# Patient Record
Sex: Male | Born: 1960 | Race: White | Hispanic: No | Smoking: Former smoker
Health system: Southern US, Community
[De-identification: ages and names within clinical notes are randomized; demographics above are authoritative.]

## PROBLEM LIST (undated history)

## (undated) DIAGNOSIS — Z86010 Personal history of colonic polyps: Secondary | ICD-10-CM

## (undated) DIAGNOSIS — R413 Other amnesia: Secondary | ICD-10-CM

## (undated) DIAGNOSIS — R42 Dizziness and giddiness: Secondary | ICD-10-CM

## (undated) DIAGNOSIS — D649 Anemia, unspecified: Secondary | ICD-10-CM

## (undated) DIAGNOSIS — Z720 Tobacco use: Secondary | ICD-10-CM

## (undated) DIAGNOSIS — E785 Hyperlipidemia, unspecified: Secondary | ICD-10-CM

## (undated) DIAGNOSIS — H8110 Benign paroxysmal vertigo, unspecified ear: Secondary | ICD-10-CM

## (undated) DIAGNOSIS — I219 Acute myocardial infarction, unspecified: Secondary | ICD-10-CM

## (undated) DIAGNOSIS — K648 Other hemorrhoids: Secondary | ICD-10-CM

## (undated) DIAGNOSIS — I2510 Atherosclerotic heart disease of native coronary artery without angina pectoris: Secondary | ICD-10-CM

## (undated) DIAGNOSIS — T7840XA Allergy, unspecified, initial encounter: Secondary | ICD-10-CM

## (undated) DIAGNOSIS — Z860101 Personal history of adenomatous and serrated colon polyps: Secondary | ICD-10-CM

## (undated) DIAGNOSIS — K222 Esophageal obstruction: Secondary | ICD-10-CM

## (undated) DIAGNOSIS — N912 Amenorrhea, unspecified: Secondary | ICD-10-CM

## (undated) DIAGNOSIS — K219 Gastro-esophageal reflux disease without esophagitis: Secondary | ICD-10-CM

## (undated) DIAGNOSIS — I10 Essential (primary) hypertension: Secondary | ICD-10-CM

## (undated) HISTORY — DX: Other amnesia: R41.3

## (undated) HISTORY — DX: Hyperlipidemia, unspecified: E78.5

## (undated) HISTORY — DX: Acute myocardial infarction, unspecified: I21.9

## (undated) HISTORY — DX: Dizziness and giddiness: R42

## (undated) HISTORY — DX: Other hemorrhoids: K64.8

## (undated) HISTORY — DX: Amenorrhea, unspecified: N91.2

## (undated) HISTORY — DX: Tobacco use: Z72.0

## (undated) HISTORY — DX: Hemochromatosis, unspecified: E83.119

## (undated) HISTORY — PX: COLONOSCOPY: SHX174

## (undated) HISTORY — DX: Personal history of adenomatous and serrated colon polyps: Z86.0101

## (undated) HISTORY — DX: Personal history of colonic polyps: Z86.010

## (undated) HISTORY — DX: Anemia, unspecified: D64.9

## (undated) HISTORY — PX: OTHER SURGICAL HISTORY: SHX169

## (undated) HISTORY — DX: Essential (primary) hypertension: I10

## (undated) HISTORY — PX: MEDIAN STERNOTOMY: SUR860

## (undated) HISTORY — DX: Esophageal obstruction: K22.2

## (undated) HISTORY — PX: CORONARY ARTERY BYPASS GRAFT: SHX141

## (undated) HISTORY — DX: Gastro-esophageal reflux disease without esophagitis: K21.9

## (undated) HISTORY — DX: Gilbert syndrome: E80.4

## (undated) HISTORY — DX: Benign paroxysmal vertigo, unspecified ear: H81.10

## (undated) HISTORY — DX: Allergy, unspecified, initial encounter: T78.40XA

## (undated) HISTORY — DX: Atherosclerotic heart disease of native coronary artery without angina pectoris: I25.10

---

## 2001-12-31 ENCOUNTER — Encounter: Payer: Self-pay | Admitting: Internal Medicine

## 2002-02-03 ENCOUNTER — Ambulatory Visit (HOSPITAL_COMMUNITY): Admission: RE | Admit: 2002-02-03 | Discharge: 2002-02-03 | Payer: Self-pay | Admitting: Gastroenterology

## 2002-02-03 ENCOUNTER — Encounter: Payer: Self-pay | Admitting: Internal Medicine

## 2002-02-03 ENCOUNTER — Encounter (INDEPENDENT_AMBULATORY_CARE_PROVIDER_SITE_OTHER): Payer: Self-pay | Admitting: *Deleted

## 2005-05-21 DIAGNOSIS — I219 Acute myocardial infarction, unspecified: Secondary | ICD-10-CM

## 2005-05-21 HISTORY — DX: Acute myocardial infarction, unspecified: I21.9

## 2005-06-03 ENCOUNTER — Ambulatory Visit: Payer: Self-pay | Admitting: Cardiology

## 2005-06-03 ENCOUNTER — Inpatient Hospital Stay (HOSPITAL_COMMUNITY): Admission: EM | Admit: 2005-06-03 | Discharge: 2005-06-12 | Payer: Self-pay | Admitting: *Deleted

## 2005-06-22 ENCOUNTER — Ambulatory Visit: Payer: Self-pay | Admitting: Cardiology

## 2005-08-09 ENCOUNTER — Encounter (HOSPITAL_COMMUNITY): Admission: RE | Admit: 2005-08-09 | Discharge: 2005-11-07 | Payer: Self-pay | Admitting: Cardiology

## 2005-09-20 ENCOUNTER — Emergency Department (HOSPITAL_COMMUNITY): Admission: EM | Admit: 2005-09-20 | Discharge: 2005-09-20 | Payer: Self-pay | Admitting: Emergency Medicine

## 2005-09-21 ENCOUNTER — Ambulatory Visit: Payer: Self-pay | Admitting: Cardiology

## 2005-09-26 ENCOUNTER — Ambulatory Visit: Payer: Self-pay | Admitting: Cardiology

## 2005-10-03 ENCOUNTER — Ambulatory Visit: Payer: Self-pay | Admitting: Cardiology

## 2005-10-10 ENCOUNTER — Ambulatory Visit: Payer: Self-pay | Admitting: Internal Medicine

## 2005-11-07 ENCOUNTER — Ambulatory Visit: Payer: Self-pay | Admitting: Cardiology

## 2005-11-20 ENCOUNTER — Ambulatory Visit: Payer: Self-pay | Admitting: Cardiology

## 2006-01-30 ENCOUNTER — Ambulatory Visit: Payer: Self-pay | Admitting: Internal Medicine

## 2006-02-18 ENCOUNTER — Encounter: Payer: Self-pay | Admitting: Internal Medicine

## 2006-03-27 ENCOUNTER — Ambulatory Visit: Payer: Self-pay | Admitting: Cardiology

## 2006-06-06 ENCOUNTER — Ambulatory Visit: Payer: Self-pay | Admitting: Internal Medicine

## 2006-07-18 ENCOUNTER — Ambulatory Visit: Payer: Self-pay | Admitting: Internal Medicine

## 2006-11-18 ENCOUNTER — Ambulatory Visit: Payer: Self-pay | Admitting: Internal Medicine

## 2007-03-28 ENCOUNTER — Ambulatory Visit: Payer: Self-pay | Admitting: Internal Medicine

## 2007-03-28 DIAGNOSIS — E785 Hyperlipidemia, unspecified: Secondary | ICD-10-CM | POA: Insufficient documentation

## 2007-03-28 DIAGNOSIS — I10 Essential (primary) hypertension: Secondary | ICD-10-CM | POA: Insufficient documentation

## 2007-03-28 LAB — CONVERTED CEMR LAB
Albumin: 4 g/dL (ref 3.5–5.2)
BUN: 10 mg/dL (ref 6–23)
Calcium: 9.4 mg/dL (ref 8.4–10.5)
Chloride: 102 meq/L (ref 96–112)
Cholesterol: 151 mg/dL (ref 0–200)
Creatinine, Ser: 0.9 mg/dL (ref 0.4–1.5)
GFR calc Af Amer: 116 mL/min
GFR calc non Af Amer: 96 mL/min
Potassium: 4 meq/L (ref 3.5–5.1)
Sodium: 139 meq/L (ref 135–145)

## 2007-04-02 ENCOUNTER — Ambulatory Visit: Payer: Self-pay | Admitting: Cardiology

## 2007-07-14 DIAGNOSIS — I2510 Atherosclerotic heart disease of native coronary artery without angina pectoris: Secondary | ICD-10-CM | POA: Insufficient documentation

## 2007-07-14 DIAGNOSIS — Z860100 Personal history of colon polyps, unspecified: Secondary | ICD-10-CM | POA: Insufficient documentation

## 2007-11-14 ENCOUNTER — Ambulatory Visit: Payer: Self-pay | Admitting: Internal Medicine

## 2007-11-14 DIAGNOSIS — R413 Other amnesia: Secondary | ICD-10-CM | POA: Insufficient documentation

## 2008-03-29 ENCOUNTER — Ambulatory Visit: Payer: Self-pay | Admitting: Cardiology

## 2008-04-20 ENCOUNTER — Ambulatory Visit: Payer: Self-pay | Admitting: Internal Medicine

## 2008-05-10 ENCOUNTER — Ambulatory Visit: Payer: Self-pay | Admitting: Cardiology

## 2008-05-10 LAB — CONVERTED CEMR LAB
ALT: 45 units/L (ref 0–53)
AST: 28 units/L (ref 0–37)
Alkaline Phosphatase: 48 units/L (ref 39–117)
BUN: 11 mg/dL (ref 6–23)
Calcium: 9.3 mg/dL (ref 8.4–10.5)
Cholesterol: 190 mg/dL (ref 0–200)
Creatinine, Ser: 0.9 mg/dL (ref 0.4–1.5)
Direct LDL: 118.6 mg/dL
GFR calc Af Amer: 115 mL/min
HDL: 28 mg/dL — ABNORMAL LOW (ref >39.0)
Sodium: 139 meq/L (ref 135–145)
Total CHOL/HDL Ratio: 6.8
Total Protein: 6.9 g/dL (ref 6.0–8.3)
VLDL: 50 mg/dL — ABNORMAL HIGH (ref 0–40)

## 2008-10-13 ENCOUNTER — Telehealth: Payer: Self-pay | Admitting: Cardiology

## 2008-10-14 ENCOUNTER — Ambulatory Visit: Payer: Self-pay | Admitting: Internal Medicine

## 2008-10-14 LAB — CONVERTED CEMR LAB
ALT: 71 units/L — ABNORMAL HIGH (ref 0–53)
Alkaline Phosphatase: 48 units/L (ref 39–117)
BUN: 16 mg/dL (ref 6–23)
Bilirubin, Direct: 0.8 mg/dL — ABNORMAL HIGH (ref 0.0–0.3)
CO2: 28 meq/L (ref 19–32)
Creatinine, Ser: 0.9 mg/dL (ref 0.4–1.5)
Potassium: 3.7 meq/L (ref 3.5–5.1)
Total Bilirubin: 3.4 mg/dL — ABNORMAL HIGH (ref 0.3–1.2)

## 2008-10-20 ENCOUNTER — Ambulatory Visit: Payer: Self-pay | Admitting: Internal Medicine

## 2008-10-25 ENCOUNTER — Telehealth (INDEPENDENT_AMBULATORY_CARE_PROVIDER_SITE_OTHER): Payer: Self-pay | Admitting: *Deleted

## 2009-01-14 ENCOUNTER — Encounter (INDEPENDENT_AMBULATORY_CARE_PROVIDER_SITE_OTHER): Payer: Self-pay | Admitting: *Deleted

## 2009-04-22 ENCOUNTER — Ambulatory Visit: Payer: Self-pay | Admitting: Internal Medicine

## 2009-04-22 LAB — CONVERTED CEMR LAB
ALT: 59 units/L — ABNORMAL HIGH (ref 0–53)
AST: 33 units/L (ref 0–37)
Bilirubin, Direct: 0.5 mg/dL — ABNORMAL HIGH (ref 0.0–0.3)
Cholesterol: 156 mg/dL (ref 0–200)
HDL: 31.9 mg/dL — ABNORMAL LOW (ref >39.00)
Total Bilirubin: 2.1 mg/dL — ABNORMAL HIGH (ref 0.3–1.2)
Total CHOL/HDL Ratio: 5
Total Protein: 7.4 g/dL (ref 6.0–8.3)
VLDL: 35.8 mg/dL (ref 0.0–40.0)

## 2009-05-16 ENCOUNTER — Ambulatory Visit: Payer: Self-pay | Admitting: Internal Medicine

## 2009-05-16 DIAGNOSIS — M7710 Lateral epicondylitis, unspecified elbow: Secondary | ICD-10-CM | POA: Insufficient documentation

## 2009-05-16 LAB — CONVERTED CEMR LAB
Ferritin: 597 ng/mL — ABNORMAL HIGH (ref 22–322)
Saturation Ratios: 49 % (ref 20–55)
Total CK: 81 units/L (ref 7–232)
UIBC: 171 ug/dL

## 2009-05-17 ENCOUNTER — Telehealth: Payer: Self-pay | Admitting: Internal Medicine

## 2009-06-27 ENCOUNTER — Telehealth: Payer: Self-pay | Admitting: Internal Medicine

## 2009-07-01 ENCOUNTER — Encounter: Payer: Self-pay | Admitting: Cardiology

## 2009-07-14 ENCOUNTER — Ambulatory Visit: Payer: Self-pay | Admitting: Internal Medicine

## 2009-07-14 LAB — CONVERTED CEMR LAB
Albumin: 4 g/dL (ref 3.5–5.2)
Alkaline Phosphatase: 47 units/L (ref 39–117)
Cholesterol: 160 mg/dL (ref 0–200)
HDL: 39.3 mg/dL (ref >39.00)
LDL Cholesterol: 87 mg/dL (ref 0–99)
Total CHOL/HDL Ratio: 4
Total Protein: 6.9 g/dL (ref 6.0–8.3)

## 2009-07-20 ENCOUNTER — Telehealth: Payer: Self-pay | Admitting: Internal Medicine

## 2009-08-10 ENCOUNTER — Encounter: Payer: Self-pay | Admitting: Cardiology

## 2009-08-10 ENCOUNTER — Ambulatory Visit: Payer: Self-pay | Admitting: Internal Medicine

## 2009-08-10 ENCOUNTER — Ambulatory Visit: Payer: Self-pay | Admitting: Cardiology

## 2009-08-29 ENCOUNTER — Telehealth: Payer: Self-pay | Admitting: Internal Medicine

## 2009-08-30 ENCOUNTER — Encounter (INDEPENDENT_AMBULATORY_CARE_PROVIDER_SITE_OTHER): Payer: Self-pay | Admitting: *Deleted

## 2009-09-30 ENCOUNTER — Ambulatory Visit: Payer: Self-pay | Admitting: Cardiology

## 2009-10-04 LAB — CONVERTED CEMR LAB
Albumin: 4.2 g/dL (ref 3.5–5.2)
Alkaline Phosphatase: 47 units/L (ref 39–117)
Cholesterol: 178 mg/dL (ref 0–200)
Direct LDL: 116.7 mg/dL
HDL: 29.5 mg/dL — ABNORMAL LOW (ref >39.00)
Total CHOL/HDL Ratio: 6
VLDL: 40.8 mg/dL — ABNORMAL HIGH (ref 0.0–40.0)

## 2009-10-05 ENCOUNTER — Ambulatory Visit: Payer: Self-pay | Admitting: Internal Medicine

## 2009-10-14 ENCOUNTER — Telehealth: Payer: Self-pay | Admitting: Internal Medicine

## 2009-10-24 ENCOUNTER — Encounter: Payer: Self-pay | Admitting: Internal Medicine

## 2009-10-24 ENCOUNTER — Ambulatory Visit (HOSPITAL_COMMUNITY): Admission: RE | Admit: 2009-10-24 | Discharge: 2009-10-24 | Payer: Self-pay | Admitting: Internal Medicine

## 2009-10-26 ENCOUNTER — Telehealth: Payer: Self-pay | Admitting: Internal Medicine

## 2009-11-03 ENCOUNTER — Telehealth: Payer: Self-pay | Admitting: Internal Medicine

## 2009-11-10 ENCOUNTER — Ambulatory Visit: Payer: Self-pay | Admitting: Internal Medicine

## 2009-11-10 ENCOUNTER — Telehealth (INDEPENDENT_AMBULATORY_CARE_PROVIDER_SITE_OTHER): Payer: Self-pay

## 2009-12-07 ENCOUNTER — Ambulatory Visit: Payer: Self-pay | Admitting: Internal Medicine

## 2009-12-07 LAB — CONVERTED CEMR LAB
Basophils Absolute: 0 K/uL
Basophils Relative: 0.7 %
Eosinophils Absolute: 0.2 K/uL
Eosinophils Relative: 2.7 %
HCT: 38.8 % — ABNORMAL LOW
Hemoglobin: 13.7 g/dL
Lymphocytes Relative: 32.2 %
Lymphs Abs: 2 K/uL
MCHC: 35.4 g/dL
MCV: 97.2 fL
Monocytes Absolute: 0.7 K/uL
Monocytes Relative: 11.9 %
Neutro Abs: 3.2 K/uL
Neutrophils Relative %: 52.5 %
Platelets: 213 K/uL
RBC: 3.99 M/uL — ABNORMAL LOW
RDW: 12.9 %
WBC: 6.2 10*3/microliter

## 2009-12-16 ENCOUNTER — Ambulatory Visit: Payer: Self-pay | Admitting: Internal Medicine

## 2009-12-16 LAB — CONVERTED CEMR LAB
Basophils Absolute: 0 10*3/uL (ref 0.0–0.1)
Eosinophils Absolute: 0.1 10*3/uL (ref 0.0–0.7)
Eosinophils Relative: 2.4 % (ref 0.0–5.0)
Hemoglobin: 13.7 g/dL (ref 13.0–17.0)
Neutro Abs: 3 10*3/uL (ref 1.4–7.7)

## 2009-12-21 ENCOUNTER — Ambulatory Visit: Payer: Self-pay | Admitting: Internal Medicine

## 2009-12-22 LAB — CONVERTED CEMR LAB
Eosinophils Relative: 3.6 % (ref 0.0–5.0)
Lymphs Abs: 1.5 10*3/uL (ref 0.7–4.0)
MCV: 98.9 fL (ref 78.0–100.0)
Monocytes Relative: 15.2 % — ABNORMAL HIGH (ref 3.0–12.0)
Platelets: 166 10*3/uL (ref 150.0–400.0)
RBC: 3.7 M/uL — ABNORMAL LOW (ref 4.22–5.81)

## 2009-12-28 ENCOUNTER — Ambulatory Visit: Payer: Self-pay | Admitting: Internal Medicine

## 2009-12-29 LAB — CONVERTED CEMR LAB
Basophils Absolute: 0.1 10*3/uL (ref 0.0–0.1)
Eosinophils Absolute: 0.2 10*3/uL (ref 0.0–0.7)
MCV: 100.5 fL — ABNORMAL HIGH (ref 78.0–100.0)
Neutro Abs: 2.7 10*3/uL (ref 1.4–7.7)
Neutrophils Relative %: 53.7 % (ref 43.0–77.0)
RBC: 3.58 M/uL — ABNORMAL LOW (ref 4.22–5.81)

## 2010-01-04 ENCOUNTER — Ambulatory Visit: Payer: Self-pay | Admitting: Internal Medicine

## 2010-01-06 LAB — CONVERTED CEMR LAB
Basophils Absolute: 0.1 10*3/uL (ref 0.0–0.1)
Basophils Relative: 1.1 % (ref 0.0–3.0)
Eosinophils Absolute: 0.1 10*3/uL (ref 0.0–0.7)
HCT: 37.3 % — ABNORMAL LOW (ref 39.0–52.0)
MCV: 101.6 fL — ABNORMAL HIGH (ref 78.0–100.0)
Monocytes Absolute: 0.5 10*3/uL (ref 0.1–1.0)
Neutro Abs: 3 10*3/uL (ref 1.4–7.7)
Neutrophils Relative %: 56.2 % (ref 43.0–77.0)
Platelets: 189 10*3/uL (ref 150.0–400.0)
RBC: 3.67 M/uL — ABNORMAL LOW (ref 4.22–5.81)
WBC: 5.3 10*3/uL (ref 4.5–10.5)

## 2010-01-12 ENCOUNTER — Ambulatory Visit: Payer: Self-pay | Admitting: Internal Medicine

## 2010-01-12 ENCOUNTER — Encounter: Payer: Self-pay | Admitting: Internal Medicine

## 2010-01-12 ENCOUNTER — Emergency Department (HOSPITAL_COMMUNITY): Admission: EM | Admit: 2010-01-12 | Discharge: 2010-01-12 | Payer: Self-pay | Admitting: Emergency Medicine

## 2010-01-12 DIAGNOSIS — R55 Syncope and collapse: Secondary | ICD-10-CM | POA: Insufficient documentation

## 2010-01-12 LAB — CONVERTED CEMR LAB
Eosinophils Relative: 2.4 % (ref 0.0–5.0)
Hemoglobin: 13.2 g/dL (ref 13.0–17.0)
Lymphocytes Relative: 20.8 % (ref 12.0–46.0)
Lymphs Abs: 0.9 10*3/uL (ref 0.7–4.0)
MCHC: 35.8 g/dL (ref 30.0–36.0)
Monocytes Absolute: 0.6 10*3/uL (ref 0.1–1.0)
Neutro Abs: 2.5 10*3/uL (ref 1.4–7.7)
WBC: 4.2 10*3/uL — ABNORMAL LOW (ref 4.5–10.5)

## 2010-01-16 ENCOUNTER — Telehealth: Payer: Self-pay | Admitting: Internal Medicine

## 2010-01-19 ENCOUNTER — Ambulatory Visit: Payer: Self-pay | Admitting: Internal Medicine

## 2010-02-07 LAB — CONVERTED CEMR LAB
Eosinophils Relative: 4.6 % (ref 0.0–5.0)
Lymphocytes Relative: 32.2 % (ref 12.0–46.0)
Monocytes Absolute: 0.7 10*3/uL (ref 0.1–1.0)
Neutro Abs: 2.6 10*3/uL (ref 1.4–7.7)
RDW: 13.4 % (ref 11.5–14.6)
WBC: 5.3 10*3/uL (ref 4.5–10.5)

## 2010-02-17 ENCOUNTER — Ambulatory Visit: Payer: Self-pay | Admitting: Internal Medicine

## 2010-02-23 ENCOUNTER — Telehealth: Payer: Self-pay | Admitting: Internal Medicine

## 2010-02-24 ENCOUNTER — Encounter (HOSPITAL_COMMUNITY)
Admission: RE | Admit: 2010-02-24 | Discharge: 2010-05-25 | Payer: Self-pay | Source: Home / Self Care | Attending: Internal Medicine | Admitting: Internal Medicine

## 2010-02-24 ENCOUNTER — Encounter: Payer: Self-pay | Admitting: Internal Medicine

## 2010-03-23 ENCOUNTER — Encounter: Payer: Self-pay | Admitting: Internal Medicine

## 2010-04-21 ENCOUNTER — Encounter: Payer: Self-pay | Admitting: Internal Medicine

## 2010-04-28 ENCOUNTER — Telehealth: Payer: Self-pay | Admitting: Internal Medicine

## 2010-05-23 ENCOUNTER — Ambulatory Visit: Admit: 2010-05-23 | Payer: Self-pay | Admitting: Internal Medicine

## 2010-06-11 ENCOUNTER — Encounter: Payer: Self-pay | Admitting: Thoracic Surgery (Cardiothoracic Vascular Surgery)

## 2010-06-20 NOTE — Progress Notes (Signed)
Summary: next colonoscopy  Phone Note Outgoing Call   Summary of Call: Let him know that I reviewed colonoscopy reports and that I recommend repeat colonoscopy Oct 2012. different than Dr. Kinnie Scales but consistent with current guidelines I have entered into [facility] but not ID[facility]. Initial call taken by: Iva Boop MD, Clementeen Graham,  Oct 14, 2009 1:39 PM  Follow-up for Phone Call        I have left a message for the patient with Dr Marvell Fuller colon recommendations. Follow-up by: Darcey Nora RN, CGRN,  Oct 14, 2009 2:24 PM

## 2010-06-20 NOTE — Progress Notes (Signed)
Summary: start phlebotomy  Phone Note Outgoing Call   Summary of Call: please call patient and let him know I want him to start phlebotomy. Should have this weekly and check ferritin after the fourth phlebotomy. He should have an office visit with me in 2-3 months but we will communicate the phlebotomy results to him (the ferritin, I mean). Iva Boop MD, [facility]  November 03, 2009 6:56 PM   Follow-up for Phone Call        Results reviewed with pt .  REV scheduled for 01/12/10 1:30.  He will come for phlebotomy #1 of 4 tomorrow at 8:00. 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

## 2010-06-20 NOTE — Letter (Signed)
Summary: New Patient letter  [facility]  [address] [place], [place] [address]   Phone: [phone]  Fax: [phone]       08/30/2009 MRN: [id number]  Dylan Hudson [address] [place], [place]  [address]  Dear Mr. Gallien,  Welcome to the Gastroenterology Division at [facility].    You are scheduled to see Dr.  Leone Payor on 10-05-09 at 2:45pm on the 3rd floor at [facility], [address].  We ask that you try to arrive at our office 15 minutes prior to your appointment time to allow for check-in.  We would like you to complete the enclosed self-administered evaluation form prior to your visit and bring it with you on the day of your appointment.  We will review it with you.  Also, please bring a complete list of all your medications or, if you prefer, bring the medication bottles and we will list them.  Please bring your insurance card so that we may make a copy of it.  If your insurance requires a referral to see a specialist, please bring your referral form from your primary care physician.  Co-payments are due at the time of your visit and may be paid by cash, check or credit card.     Your office visit will consist of a consult with your physician (includes a physical exam), any laboratory testing he/she may order, scheduling of any necessary diagnostic testing (e.g. x-ray, ultrasound, CT-scan), and scheduling of a procedure (e.g. Endoscopy, Colonoscopy) if required.  Please allow enough time on your schedule to allow for any/all of these possibilities.    If you cannot keep your appointment, please call [phone] to cancel or reschedule prior to your appointment date.  This allows [place] the opportunity to schedule an appointment for another patient in need of care.  If you do not cancel or reschedule by 5 p.m. the business day prior to your appointment date, you will be charged a $50.00 late cancellation/no-show fee.    Thank you for choosing  [facility] for your medical needs.  We appreciate the opportunity to care for you.  Please visit [place] at our website  to learn more about our practice.                     Sincerely,                                                             The Gastroenterology Division

## 2010-06-20 NOTE — Procedures (Signed)
Summary: Colonoscopy: [facility]  Griffith Citron MD   Imported By: Lester Isabel 10/21/2009 08:08:26  _____________________________________________________________________  External Attachment:    Type:   Image     Comment:   External Document

## 2010-06-20 NOTE — Op Note (Signed)
Summary: Phlebotomy/[place]  Phlebotomy/[place]   Imported By: Sherian Rein 04/11/2010 07:44:51  _____________________________________________________________________  External Attachment:    Type:   Image     Comment:   External Document

## 2010-06-20 NOTE — Progress Notes (Signed)
Summary: medication side effects  Phone Note Call from Patient   Caller: Patient Summary of Call: Pt. called and stated that the new med he is on Livalo, makes his legs cramp-up while walking..... Pt. states that Crestor done the same thing. Call pt. back at [phone] Initial call taken by: Michaelle Copas,  July 20, 2009 2:53 PM  Follow-up for Phone Call        ok to stop livalo Follow-up by: D. Thomos Lemons DO,  July 21, 2009 1:06 PM  Additional Follow-up for Phone Call Additional follow up Details #1::        informed pt. to stop Livalo Additional Follow-up by: Michaelle Copas,  July 21, 2009 1:16 PM

## 2010-06-20 NOTE — Medication Information (Signed)
Summary: RX Folder/ LISINOPRIL  RX Folder/ LISINOPRIL   Imported By: Dorise Hiss 07/01/2009 15:17:53  _____________________________________________________________________  External Attachment:    Type:   Image     Comment:   External Document

## 2010-06-20 NOTE — Procedures (Signed)
Summary: Colonoscopy: [facility]  Griffith Citron MD   Imported By: Lester [place] 10/21/2009 08:09:44  _____________________________________________________________________  External Attachment:    Type:   Image     Comment:   External Document  Appended Document: Colonoscopy: [facility]   Colonoscopy  Procedure date:  02/18/2006  Findings:      Results: Hemorrhoids. Results: Diverticulosis. Pathology:  Adenomatous polyp.   Procedures Next Due Date:    Colonoscopy: 02/2010

## 2010-06-20 NOTE — Assessment & Plan Note (Signed)
History of Present Illness Visit Type: follow up Primary GI MD: Stan Head MD [facility] Primary Provider: Dondra Spry DO Requesting Provider: Dondra Spry DO History of Present Illness:   50 yo wm with hemochromatosis. He was in the lab after completing therapeutic phlebotomy tday. While pressure held on phlebotomy sites, he fFelt dizzy then nauseous. He lost consciousness,2-5 minutes perstaff and was groggy when awakened. Then had nausea and vomiting after the loss of consciousness. Similar to what he experienced with first phlebotmy attempt weeks ago. Staff said this time blood came out quickly. No chest pain, headache. Remains light-eaded and dizzy. He says last time this happened he went home and slept x 2 hours before he was better.           Current Medications (verified): 1)  Ra Aspirin 325 Mg  Tabs (Aspirin) .... Take 1 Tablet By Mouth Once A Day 2)  Toprol Xl 50 Mg  Tb24 (Metoprolol Succinate) .... Take 1 Tablet By Mouth Once A Day 3)  Fish Oil Concentrate 300 Mg  Caps (Omega-3 Fatty Acids) .... Take 1 Tablet By Mouth Once A Day 4)  Claritin 10 Mg  Tabs (Loratadine) .... Take 1 Tablet By Mouth Once A Day As Needed 5)  Lisinopril 10 Mg Tabs (Lisinopril) .Marland Kitchen.. 1 Tab By Mouth Once Daily 6)  Zetia 10 Mg Tabs (Ezetimibe) .... Take One Tablet By Mouth Daily.  Allergies (verified): 1)  Simvastatin (Simvastatin)  Past History:  Past Medical History: Last updated: 10/05/2009 CAD - S/P CABG 01/07 Possible Gilbert's syndrome (elevated bilirubin) Benign positional vertigo  Adenomatous Colon Polyps - [facility] Hyperlipidemia Hypertension Memory Loss  Past Surgical History: Last updated: 07/19/2009 Coronary artery bypass graft Median sternotomy for coronary artery bypass grafting x4 (left internal mammary artery to distal left anterior descending coronary artery, right internal mammary artery to first circumflex marginal branch, saphenous  vein graft to posterior descending  coronary artery, saphenous vein graft to diagonal branch, endoscopic saphenous vein harvest from right thigh). SURGEON:  Salvatore Decent. Cornelius Moras, M.D. CHO/MEDQ  D:  06/08/2005  T:  06/09/2005  Job:  [id number]    Left Knee Arthroscopy    Family History: Last updated: 10/05/2009 Mother and father are living.  Father has some issues also with vertigo.  Questionable history of Gilbert's syndrome in the family.  There is no family history of colon cancer or breast or prostate cancer.  Patient has siblings that are in good health.     Family History of Colon Polyps:Mother   Social History: Last updated: 10/05/2009 Occupation:  Financial planner - Software engineer Alcohol - socially (weekends) 12-18/month He had a long history of tobacco use.  Smoked since age 26 approximately 1 pack to 1  packs a day.  Fortunately after his bypass surgery he completely discontinued smoking. Divorced, lives alone Hobby :  fishing     Vital Signs:  Patient profile:   49 year old male Pulse rate:   60 / minute Pulse (ortho):   60 / minute Pulse rhythm:   regular BP supine:   116 / 80  (right arm) BP sitting:   130 / 80  (right arm)  Physical Exam  General:  sweaty, mildly ill Eyes:  anicteric Lungs:  Clear throughout to auscultation. Heart:  Regular rate and rhythm; no murmurs, rubs,  or bruits. Abdomen:  soft, nontender Neurologic:  Alert and  oriented x4;  grossly normal neurologically.   Impression & Recommendations:  Problem # 1:  SYNCOPE (  ICD-780.2) Assessment New Second time Both related to phlebotomy Seems like it is that and vasovagal. His heart rate is 60 all the time, presumably due to Toprol. He has improved while here and no chest pain or neurologic issues apparent but He continues to have some dizziness and intermittet nausea and vomiting so will have him transported to ED for evaluation and treatment.  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  Problem # 3:  CORONARY ARTERY DISEASE (ICD-414.00) Assessment: Unchanged  Problem # 4:  GILBERT'S SYNDROME (ICD-277.4)  Problem # 5:  HYPERTENSION (ICD-401.9) Assessment: Unchanged  Patient Instructions: 1)  To ER by ambulance.

## 2010-06-20 NOTE — Assessment & Plan Note (Signed)
Summary: follow up hemochromatosis/sheri   History of Present Illness Visit Type: Follow-up Visit Primary GI MD: Stan Head MD [facility] Primary Provider: Dondra Spry DO Requesting Provider: Dondra Spry DO Chief Complaint: hemochromatosis  History of Present Illness:   Patient here before we have restarted lower amount of phlebotomy and at short stay. No charge for visit as this was one scheduled prior to changing his phlebotomy plan.   GI Review of Systems      Denies abdominal pain, acid reflux, belching, bloating, chest pain, dysphagia with liquids, dysphagia with solids, heartburn, loss of appetite, nausea, vomiting, vomiting blood, weight loss, and  weight gain.        Denies anal fissure, black tarry stools, change in bowel habit, constipation, diarrhea, diverticulosis, fecal incontinence, heme positive stool, hemorrhoids, irritable bowel syndrome, jaundice, light color stool, liver problems, rectal bleeding, and  rectal pain.    Current Medications (verified): 1)  Ra Aspirin 325 Mg  Tabs (Aspirin) .... Take 1 Tablet By Mouth Once A Day 2)  Toprol Xl 50 Mg  Tb24 (Metoprolol Succinate) .... Take 1 Tablet By Mouth Once A Day 3)  Fish Oil Concentrate 300 Mg  Caps (Omega-3 Fatty Acids) .... Take 1 Tablet By Mouth Once A Day 4)  Claritin 10 Mg  Tabs (Loratadine) .... Take 1 Tablet By Mouth Once A Day As Needed 5)  Lisinopril 10 Mg Tabs (Lisinopril) .Marland Kitchen.. 1 Tab By Mouth Once Daily 6)  Zetia 10 Mg Tabs (Ezetimibe) .... Take One Tablet By Mouth Daily.  Allergies (verified): 1)  Simvastatin (Simvastatin)  Past History:  Past Medical History: CAD - S/P CABG 01/07 Possible Gilbert's syndrome (elevated bilirubin) Benign positional vertigo  Adenomatous Colon Polyps - [facility] Hyperlipidemia Hypertension Memory Loss Hemochromatosis  Past Surgical History: Reviewed history from 07/19/2009 and no changes required. Coronary artery bypass graft Median sternotomy for coronary artery  bypass grafting x4 (left internal mammary artery to distal left anterior descending coronary artery, right internal mammary artery to first circumflex marginal branch, saphenous  vein graft to posterior descending coronary artery, saphenous vein graft to diagonal branch, endoscopic saphenous vein harvest from right thigh). SURGEON:  Salvatore Decent. Cornelius Moras, M.D. CHO/MEDQ  D:  06/08/2005  T:  06/09/2005  Job:  [id number]    Left Knee Arthroscopy    Family History: Reviewed history from 10/05/2009 and no changes required. Mother and father are living.  Father has some issues also with vertigo.  Questionable history of Gilbert's syndrome in the family.  There is no family history of colon cancer or breast or prostate cancer.  Patient has siblings that are in good health.     Family History of Colon Polyps:Mother   Social History: Reviewed history from 10/05/2009 and no changes required. 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  Smoked since age 28 approximately 1 pack to 1  packs a day.  Fortunately after his bypass surgery he completely discontinued smoking. 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  Patient Instructions: 1)  Your phlebotomy appt today has been rescheduled to Friday, February 24, 2010 at [facility]. 2)  We will call you  with further follow up instructions. 3)  The medication list was reviewed and reconciled.  All changed / newly prescribed medications were explained.  A complete medication list was provided to the patient / caregiver.

## 2010-06-20 NOTE — Progress Notes (Signed)
Summary: Phlebotomy   Phone Note Other Incoming   Caller: [facility] Summary of Call: I was called by the [place] [name] lab to come down to assist a patient that had passed out during a phlebotomy draw.  Patient  hadn't eaten prior to presenting to the lab.  During the phlebotomy draw he became diaphoretic and passed out.  Upon my arival to the lab patient was found in a recliner chair, awake, alert and oriented.  He was pale and diaphoretic.  Pulse regular at 88, BP 98/64.  Patient  was offered crackers and a  Mt Dew.  He drank and ate some, but was feeling no better.  I checked a glucose and his glucose was 104.  He was offered a glucose tablet, after eating this he vomited.  Patient  remained diaphoretic and pale for some time and he reported feeling very weak.  BP was checked periodically and remained 90-100/60s.  Patient  reported after about 15 minutes he was feeling better and his color had returned.  His legs were slowly lowered and he tolerated this well.  Patient 's family was at his side.  He was trasfered by wheelchair to a family vehicle.  Upon discharge patient reported he felt much better and was able to ambulate from the wheelchair to the car without assistance.  He was asked not to drive or operate machinery today, make all position changes very slowly and eat a meal asap.  I have asked him to call me in the am with an update.  Dr Leone Payor he was only able to get 1/4 of the ordered phlebotomy performed prior to him passing out, they terminated the draw.  When is it ok to get next phlebotomy and does he need to start over with #1?  Please advise Initial call taken by: Darcey Nora RN, CGRN,  November 10, 2009 11:13 AM  Follow-up for Phone Call        does he have a hx of fear of needles? Was iit especially painful? he needs to try again anytime he is ready if there are issues as ?ed above let me know Follow-up by: Iva Boop MD, Clementeen Graham,  November 15, 2009 7:56 AM  Additional Follow-up for  Phone Call Additional follow up Details #1::        Left message for patient to call back Darcey Nora RN, [facility]  November 15, 2009 8:49 AM    Additional Follow-up for Phone Call Additional follow up Details #2::    Patient  aware he will come in an afternoon after he has eaten.  He is not afraid of needles, he did fine with the first stick and was part way through the phlebotomy when he passed out.  Follow-up by: Darcey Nora RN, CGRN,  November 15, 2009 9:47 AM  Additional Follow-up for Phone Call Additional follow up Details #3:: Details for Additional Follow-up Action Taken: ok Additional Follow-up by: Iva Boop MD, Clementeen Graham,  November 15, 2009 12:49 PM

## 2010-06-20 NOTE — Progress Notes (Signed)
Summary: ferritin and Hgb 9/1, holding phlebotomy  Phone Note Outgoing Call   Summary of Call: I explained that I want to have his ferritin and Hgb checked this week. He can come Thurs 9/1. Gavin Pound to place orders he is to hold phlebotomy for now. I will check with Dr. Jens Som also but think that we will resume phlebotomy in next couple of weeks but take less off. Iva Boop MD, [facility]  January 16, 2010 5:24 PM Add cell # to phone #'s it is [phone]  Follow-up for Phone Call        Pt. will have labs drawn 01-19-10, he is aware -No Phlebotomy. The order  in IDX has been updated. Pt. instructed to call back as needed.  Follow-up by: Laureen Ochs LPN,  January 17, 2010 9:42 AM

## 2010-06-20 NOTE — Assessment & Plan Note (Signed)
Summary: Dylan Hudson   Vital Signs:  Patient profile:   50 year old male Height:      72 inches Weight:      219.75 pounds BMI:     29.91 O2 Sat:      98 % on Room air Temp:     97.5 degrees F oral Pulse rate:   58 / minute Pulse rhythm:   regular Resp:     18 per minute BP sitting:   100 / 80  (right arm) Cuff size:   large  Vitals Entered By: Glendell Docker CMA (August 10, 2009 11:17 AM)  O2 Flow:  Room air CC: Rm 2- follow up on medication Comments cholesterol medication follow up, states he had severe cramping with simvastatin making it difficult to walk   Visit Type:  1 year follow up Primary Care Provider:  Dondra Spry DO  CC:  Rm 2- follow up on medication.  History of Present Illness:  Hyperlipidemia Follow-Up      This is a 50 year old man who presents for Hyperlipidemia follow-up.  The patient reports muscle aches.  The patient denies the following symptoms: chest pain/pressure.  Dietary compliance has been good.  Pt could not tolerate livalo.  Htn -stable  abnormal LFT's - reviewed iron studies  Preventive Screening-Counseling & Management  Alcohol-Tobacco     Smoking Status: quit  Allergies: 1)  Simvastatin (Simvastatin)  Past History:  Past Medical History: Current Problems:  CORONARY ARTERY DISEASE (ICD-414.00) - S/P CABG 01/07 HYPERLIPIDEMIA (ICD-272.4) HYPERTENSION (ICD-401.9) TOBACCO ABUSE, HX OF (ICD- V15.82) COLONIC POLYPS, HX OF (ICD-V12.72) SPECIAL SCREENING MALIGNANT NEOPLASM OF PROSTATE (ICD-V76.44) EPICONDYLITIS, RIGHT (ICD-726.32) MYALGIA (ICD-729.1) NONSPECIFIC ABNORMAL RESULTS LIVR FUNCTION STUDY (ICD-794.8) MEMORY LOSS (ICD-780.93) Possible Gilbert's syndrome (elevated bilirubin) Benign positional vertigo   Social History: Smoking Status:  quit  Physical Exam  General:  alert, well-developed, and well-nourished.   Eyes:  mild scleral icterus Neck:  supple, no masses, and no carotid bruits.   Lungs:  normal respiratory  effort and normal breath sounds.   Heart:  normal rate, regular rhythm, no murmur, and no gallop.   Extremities:  No lower extremity edema    Impression & Recommendations:  Problem # 1:  HYPERLIPIDEMIA (ICD-272.4) Pt not able to tolerate Livalo due to myalgias.  similar side effect from Crestor. Pt likely will not be able tolerate any statins.  He did not tolerate Niaspan in the past we discussed using welchol.  Pt to discuss further with Dr. Jens Som follow low cholesterol diet and take fiber and fish oil supplement daily  Problem # 2:  NONSPECIFIC ABNORMAL RESULTS LIVR FUNCTION STUDY (ICD-794.8) Iron studies high normal.  obtain genetic testing for hemachromatosis. avoid tylenol.  avoid excessive alcohol intake  Orders: T- * Misc. Laboratory test [phone])  Problem # 3:  HYPERTENSION (ICD-401.9)  well controlled.  Maintain current medication regimen.  His updated medication list for this problem includes:    Toprol Xl 50 Mg Tb24 (Metoprolol succinate) .Marland Kitchen... Take 1 tablet by mouth once a day    Lisinopril 10 Mg Tabs (Lisinopril) .Marland Kitchen... 1 tab by mouth once daily  BP today: 100/80 Prior BP: 100/70 (05/16/2009)  Labs Reviewed: K+: 3.7 (10/14/2008) Creat: : 0.9 (10/14/2008)   Chol: 160 (07/14/2009)   HDL: 39.30 (07/14/2009)   LDL: 87 (07/14/2009)   TG: 170.0 (07/14/2009)  His updated medication list for this problem includes:    Toprol Xl 50 Mg Tb24 (Metoprolol succinate) .Marland Kitchen... Take 1  tablet by mouth once a day    Lisinopril 10 Mg Tabs (Lisinopril) .Marland Kitchen... 1 tab by mouth once daily  Complete Medication List: 1)  Ra Aspirin 325 Mg Tabs (Aspirin) .... Take 1 tablet by mouth once a day 2)  Toprol Xl 50 Mg Tb24 (Metoprolol succinate) .... Take 1 tablet by mouth once a day 3)  Fish Oil Concentrate 300 Mg Caps (Omega-3 fatty acids) .... Take 1 tablet by mouth once a day 4)  Claritin 10 Mg Tabs (Loratadine) .... Take 1 tablet by mouth once a day as needed 5)  Lisinopril 10 Mg Tabs  (Lisinopril) .Marland Kitchen.. 1 tab by mouth once daily  Patient Instructions: 1)  Please schedule a follow-up appointment in 1 year for CPX 2)  BMP prior to visit, ICD-9: 401.9 3)  Hepatic Panel prior to visit, ICD-9: 272.4 4)  Lipid Panel prior to visit, ICD-9: 272.4 5)  TSH prior to visit, ICD-9: 272.4 6)  PSA - V76.44 7)  Avoid tylenol 8)  Avoid excessive alcohol intake  Current Allergies (reviewed today): SIMVASTATIN (SIMVASTATIN)

## 2010-06-20 NOTE — Progress Notes (Signed)
Summary: Question for nurse  Phone Note Call from Patient Call back at [phone]   Call For: Dr Leone Payor Summary of Call: Had a liver biopsy last Monday and has a question for nurse. Initial call taken by: Le[facility],  October 26, 2009 12:56 PM  Follow-up for Phone Call        Patient  wants [place] to call results of the liver bx to this his cell #. Darcey Nora RN, [facility]  October 26, 2009 1:51 PM I called results and that we are waiting on hepatic iron index he is to call back if we do not call him by July 1 I think he will need phlebotomy Iva Boop MD, [facility]  October 27, 2009 4:19 PM

## 2010-06-20 NOTE — Letter (Signed)
Summary: [facility]   Imported By: Lester [place] 10/21/2009 08:04:06  _____________________________________________________________________  External Attachment:    Type:   Image     Comment:   External Document

## 2010-06-20 NOTE — Op Note (Signed)
Summary: Phlebotomy/[place]  Phlebotomy/   Imported By: Sherian Rein 03/08/2010 10:35:26  _____________________________________________________________________  External Attachment:    Type:   Image     Comment:   External Document

## 2010-06-20 NOTE — Assessment & Plan Note (Signed)
Summary:  Cardiology   Visit Type:  1 year follow up Primary Provider:  Dondra Spry DO  CC:  No cardiac complains.  History of Present Illness: Dylan Hudson is a pleasant gentleman who has a history of coronary artery disease.  In 2007, the patient had cardiac catheterization that showed an ejection fraction of 45% with severe anterior apical hypokinesis and severe coronary disease.  He ultimately underwent coronary artery bypassing graft on June 04, 2005.  At that time, he had a saphenous vein graft to the PDA, status vein graft to the diagonal, a RIMA to the OM1, and a LIMA to the LAD.  I last saw him in November 2009. Since then the patient denies any dyspnea on exertion, orthopnea, PND, pedal edema, palpitations, syncope or chest pain. Note he has had leg pain with all statins to the point that he is unable to ambulate.   Current Medications (verified): 1)  Ra Aspirin 325 Mg  Tabs (Aspirin) .... Take 1 Tablet By Mouth Once A Day 2)  Toprol Xl 50 Mg  Tb24 (Metoprolol Succinate) .... Take 1 Tablet By Mouth Once A Day 3)  Fish Oil Concentrate 300 Mg  Caps (Omega-3 Fatty Acids) .... Take 1 Tablet By Mouth Once A Day 4)  Claritin 10 Mg  Tabs (Loratadine) .... Take 1 Tablet By Mouth Once A Day As Needed 5)  Lisinopril 10 Mg Tabs (Lisinopril) .Marland Kitchen.. 1 Tab By Mouth Once Daily  Allergies: 1)  Simvastatin (Simvastatin)  Past History:  Past Medical History: Current Problems:  CORONARY ARTERY DISEASE (ICD-414.00) - S/P CABG 01/07 HYPERLIPIDEMIA (ICD-272.4) HYPERTENSION (ICD-401.9) COLONIC POLYPS, HX OF (ICD-V12.72) NONSPECIFIC ABNORMAL RESULTS LIVR FUNCTION STUDY (ICD-794.8) MEMORY LOSS (ICD-780.93) Possible Gilbert's syndrome (elevated bilirubin) Benign positional vertigo   Past Surgical History: Reviewed history from 07/19/2009 and no changes required. Coronary artery bypass graft Median sternotomy for coronary artery bypass grafting x4 (left internal mammary artery to distal  left anterior descending coronary artery, right internal mammary artery to first circumflex marginal branch, saphenous  vein graft to posterior descending coronary artery, saphenous vein graft to diagonal branch, endoscopic saphenous vein harvest from right thigh). SURGEON:  Salvatore Decent. Cornelius Moras, M.D. CHO/MEDQ  D:  06/08/2005  T:  06/09/2005  Job:  [id number]    Left Knee Arthroscopy    Social History: Reviewed history from 05/16/2009 and no changes required. Occupation:  Financial planner Alcohol - socially (weekends)  He had a long history of tobacco use.  Smoked since age 36 approximately 1 pack to 1  packs a day.  Fortunately after his bypass surgery he completely discontinued smoking. Divorced Hobby :  fishing     Review of Systems       no fevers or chills, productive cough, hemoptysis, dysphasia, odynophagia, melena, hematochezia, dysuria, hematuria, rash, seizure activity, orthopnea, PND, pedal edema, claudication. Remaining systems are negative.   Vital Signs:  Patient profile:   50 year old male Height:      72 inches Weight:      218 pounds BMI:     29.67 Pulse rate:   54 / minute Pulse rhythm:   irregular Resp:     18 per minute BP sitting:   120 / 82  (left arm) Cuff size:   large  Vitals Entered By: Vikki Ports (August 10, 2009 11:52 AM)  Physical Exam  General:  Well-developed well-nourished in no acute distress.  Skin is warm and dry.  HEENT is normal.  Neck is supple. No thyromegaly.  Chest is clear to auscultation with normal expansion.  Cardiovascular exam is regular rate and rhythm.  Abdominal exam nontender or distended. No masses palpated. Extremities show no edema. neuro grossly intact    EKG  Procedure date:  08/10/2009  Findings:      Sinus bradycardia at a rate of 54. Axis normal. No ST changes.  Impression & Recommendations:  Problem # 1:  CORONARY ARTERY DISEASE (ICD-414.00) Continue aspirin, beta blocker, ACE inhibitor. He is intolerant  to statins. Add zetia 10 mg p.o. daily. His updated medication list for this problem includes:    Ra Aspirin 325 Mg Tabs (Aspirin) .Marland Kitchen... Take 1 tablet by mouth once a day    Toprol Xl 50 Mg Tb24 (Metoprolol succinate) .Marland Kitchen... Take 1 tablet by mouth once a day    Lisinopril 10 Mg Tabs (Lisinopril) .Marland Kitchen... 1 tab by mouth once daily  Problem # 2:  HYPERLIPIDEMIA (ICD-272.4)  Intolerant to statins. Add zetia. Check lipids and liver in 6 weeks.  His updated medication list for this problem includes:    Zetia 10 Mg Tabs (Ezetimibe) .Marland Kitchen... Take one tablet by mouth daily.  Problem # 3:  HYPERTENSION (ICD-401.9) Blood pressure controlled on present medications. Will continue. Renal function monitored by primary care. His updated medication list for this problem includes:    Ra Aspirin 325 Mg Tabs (Aspirin) .Marland Kitchen... Take 1 tablet by mouth once a day    Toprol Xl 50 Mg Tb24 (Metoprolol succinate) .Marland Kitchen... Take 1 tablet by mouth once a day    Lisinopril 10 Mg Tabs (Lisinopril) .Marland Kitchen... 1 tab by mouth once daily  Patient Instructions: 1)  Your physician recommends that you schedule a follow-up appointment in: ONE YEAR 2)  Your physician recommends that you return for lab work in:6 WEEKS IF ABLE TO TOLERATE ZETIA 3)  Your physician has recommended you make the following change in your medication: START ZETIA 10MG  ONCE DAILY Prescriptions: ZETIA 10 MG TABS (EZETIMIBE) Take one tablet by mouth daily.  #30 x 12   Entered by:   Deliah Goody, RN   Authorized by:   Ferman Hamming, MD, [facility]   Signed by:   Deliah Goody, RN on 08/10/2009   Method used:   Electronically to        [facility]. [phone]* (retail)       [address]       [place], [place]  [address]       Ph: [phone]       Fax: [phone]   RxID:   [id number]

## 2010-06-20 NOTE — Assessment & Plan Note (Signed)
Summary: HEMOCHROMATOSIS/YF   History of Present Illness Visit Type: consult Primary GI MD: Stan Head MD [facility] Primary Provider: Dondra Spry DO Requesting Provider: Dondra Spry DO Chief Complaint: Hemochromatosis  History of Present Illness:   50 yo wm with abnormal transaminases and elevated bilirubin (mostly unconjugated). as part of the efavluation a ferryitin returned > 500 and iron sat 49%. 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 He drinks 12-18 beers/month. He has been on statins but is intolerant with  myalgias and memory loss. He is off these and both of these symptoms are better.  Recently saw Dr. Jens Som and other lab tests ordered due to abnormal bilirubin.   GI Review of Systems      Denies abdominal pain, acid reflux, belching, bloating, chest pain, dysphagia with liquids, dysphagia with solids, heartburn, loss of appetite, nausea, vomiting, vomiting blood, weight loss, and  weight gain.        Denies anal fissure, black tarry stools, change in bowel habit, constipation, diarrhea, diverticulosis, fecal incontinence, heme positive stool, hemorrhoids, irritable bowel syndrome, jaundice, light color stool, liver problems, rectal bleeding, and  rectal pain.    [place] of Abdomen  Procedure date:  06/06/2005  Findings:        1.  Normal gallbladder and biliary tree.   2.  No pathological findings except for increased echogenicity in the   liver.   Current Medications (verified): 1)  Ra Aspirin 325 Mg  Tabs (Aspirin) .... Take 1 Tablet By Mouth Once A Day 2)  Toprol Xl 50 Mg  Tb24 (Metoprolol Succinate) .... Take 1 Tablet By Mouth Once A Day 3)  Fish Oil Concentrate 300 Mg  Caps (Omega-3 Fatty Acids) .... Take 1 Tablet By Mouth Once A Day 4)  Claritin 10 Mg  Tabs (Loratadine) .... Take 1 Tablet By Mouth Once A Day As Needed 5)  Lisinopril 10 Mg Tabs (Lisinopril) .Marland Kitchen.. 1 Tab By Mouth Once Daily 6)  Zetia 10 Mg Tabs (Ezetimibe) .... Take One Tablet By  Mouth Daily.  Allergies (verified): 1)  Simvastatin (Simvastatin)  Past History:  Past Medical History: CAD - S/P CABG 01/07 Possible Gilbert's syndrome (elevated bilirubin) Benign positional vertigo  Adenomatous Colon Polyps - Medoff Hyperlipidemia Hypertension Memory Loss  Past Surgical History: Reviewed history from 07/19/2009 and no changes required. Coronary artery bypass graft Median sternotomy for coronary artery bypass grafting x4 (left internal mammary artery to distal left anterior descending coronary artery, right internal mammary artery to first circumflex marginal branch, saphenous  vein graft to posterior descending coronary artery, saphenous vein graft to diagonal branch, endoscopic saphenous vein harvest from right thigh). SURGEON:  Salvatore Decent. Cornelius Moras, M.D. CHO/MEDQ  D:  06/08/2005  T:  06/09/2005  Job:  [id number]    Left Knee Arthroscopy    Family History: Mother and father are living.  Father has some issues also with vertigo.  Questionable history of Gilbert's syndrome in the family.  There is no family history of colon cancer or breast or prostate cancer.  Patient has siblings that are in good health.     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  Smoked since age 46 approximately 1 pack to 1  packs a day.  Fortunately after his bypass surgery he completely discontinued smoking. Divorced, lives alone Wagoner :  fishing     Review of  Systems       All other ROS negative except as per HPI.   Vital Signs:  Patient profile:   50 year old male Height:      72 inches Weight:      215 pounds BMI:     29.26 BSA:     2.20 Pulse rate:   56 / minute Pulse rhythm:   irregular BP sitting:   118 / 74  (left arm) Cuff size:   regular  Vitals Entered By: Ok Anis CMA (Oct 05, 2009 2:38 PM)  Physical Exam  General:   Well developed, well nourished, no acute distress. Eyes:  PERRLA, no icterus. Mouth:  No deformity or lesions, dentition normal. Neck:  Supple; no masses or thyromegaly. Lungs:  Clear throughout to auscultation. Heart:  Regular rate and rhythm; no murmurs, rubs,  or bruits. Abdomen:  Soft, nontender and nondistended. No masses, hepatosplenomegaly or hernias noted. Normal bowel sounds. Extremities:  No clubbing, cyanosis, edema or deformities noted. Neurologic:  Alert and  oriented x4;  grossly normal neurologically. Cervical Nodes:  No significant cervical or supraclavicular adenopathy.  Psych:  Alert and cooperative. Normal mood and affect. Patient: Dylan Hudson Note: All result statuses are Final unless otherwise noted.  Tests: (1) Lipid Panel (LIPID)   Cholesterol               178 mg/dL                   1-610     ATP III Classification            Desirable:  < 200 mg/dL                    Borderline High:  200 - 239 mg/dL               High:  > = 240 mg/dL   Triglycerides        [H]  204.0 mg/dL                 9.6-045.4     Normal:  <150 mg/dL     Borderline High:  098 - 199 mg/dL   HDL                  [L]  11.91 mg/dL                 >47.82   VLDL Cholesterol     [H]  40.8 mg/dL                  9.5-62.1  CHO/HDL Ratio:  CHD Risk                             6                    Men          Women     1/2 Average Risk     3.4          3.3     Average Risk          5.0          4.4     2X Average Risk          9.6          7.1     3X Average Risk  15.0          11.0                           Tests: (2) Hepatic/Liver Function Panel (HEPATIC)   Total Bilirubin      [H]  3.0 mg/dL                   1.6-1.0   Direct Bilirubin     [H]  0.7 mg/dL                   9.6-0.4   Alkaline Phosphatase      47 U/L                      39-117   AST                       27 U/L                      0-37   ALT                       42 U/L                      0-53   Total  Protein             6.9 g/dL                    5.4-0.9   Albumin                   4.2 g/dL                    8.1-1.9  Tests: (3) Cholesterol LDL - Direct (DIRLDL)  Cholesterol LDL - Direct                             116.7 mg/dL     Optimal:  <147 mg/dL     Near or Above Optimal:  100-129 mg/dL     Borderline High:  829-562 mg/dL     High:  130-865 mg/dL     Very High:  >784 mg/dL  Note: An exclamation mark (!) indicates a result that was not dispersed into the flowsheet. Document Creation Date: 09/30/2009 3:36 PM  Impression & Recommendations:  Problem # 1:  ? of OTHER HEMOCHROMATOSIS (ICD-275.03) He has an echogenic liver on [place] in 2007. Elevated transaminases 1.5-2x (now normal off statins on Zetia) H63 D homozygotes are at low (very) risk of hemochromatosis. He probably hassteatosis/steatohepatitis and that would be important to know. He may need to eliminate EtOH. Bilirubin elevation must be Gilbert's Given overall picture and unclear cause of his liver abnormalities, a liver biopsy should be conclusive. i explained risks and benefits including bleeding and hospitalization. Will request iron stains and hepatic iron index also.  He does not need further lab testing. i told him not to get labs ordered by cardiology. Orders: CT/ULS Guided Liver Biospy (CT/ULS Guided Liv BX)  Problem # 2:  TRANSAMINASES, SERUM, ELEVATED (ICD-790.4) He has an echogenic liver on [place] in 2007. Elevated transaminases 1.5-2x (now normal off statins on Zetia) H63 D homozygotes are at low (very) risk of hemochromatosis. He probably hassteatosis/steatohepatitis and that would be important to know.  He may need to eliminate EtOH. Bilirubin elevation must be Gilbert's Given overall picture and unclear cause of his liver abnormalities, a liver biopsy should be conclusive. i explained risks and benefits including bleeding and hospitalization. Will request iron stains and hepatic iron index also. 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 We will call you with those details. 2)  *Do not have your labs drawn today. 3)  Copy sent to : Thomos Lemons, DO, Olga Millers, MD 4)  The medication list was reviewed and reconciled.  All changed / newly prescribed medications were explained.  A complete medication list was provided to the patient / caregiver.  Appended Document: HEMOCHROMATOSIS/YF   Colonoscopy  Procedure date:  02/03/2002  Findings:      Two adenomas 10 anmd 9 mm  Medoff  Colonoscopy  Procedure date:  02/18/2006  Findings:      5 mm transverse adenoma diverticulosis internal hemorrhoids  Medoff  Comments:      Repeat colonoscopy in 5 years.   Procedures Next Due Date:    Colonoscopy: 02/2011

## 2010-06-20 NOTE — Progress Notes (Signed)
Summary: different diagnosis   Phone Note From Other Clinic   Caller: [facility] Call For: yoo  Summary of Call: this patient had creatinine and ferritin iron tlbc the diagnosis you put on the order was myalgia.  The insurance will not accept this.  What other diagnosis can we use for this order.  [phone] ext [phone] Initial call taken by: Roselle Locus,  June 27, 2009 11:49 AM  Follow-up for Phone Call        try 790.4 Follow-up by: D. Thomos Lemons DO,  June 27, 2009 11:56 AM  Additional Follow-up for Phone Call Additional follow up Details #1::        Spoke with Olegario Messier at Lab, she was provided updated diagnosis code Additional Follow-up by: Glendell Docker CMA,  June 27, 2009 12:12 PM

## 2010-06-20 NOTE — Progress Notes (Signed)
Summary: Phlebotomy Orders  Phone Note From Other Clinic   Caller: [facility] short stay Summary of Call: PC from short stay requesting clarification on orders for Dylan Hudson's phlebotomy.  In previous calls it had been mentioned by Short Stay staff that the patient may need IV fluids following his phlebotomy to prevent syncope.  As of now, there is no order for fluids.  Do you want to add fluids if needed.  Order will need to state on what condition(s) to give fluids.   Initial call taken by: Francee Piccolo CMA Duncan Dull),  February 23, 2010 12:23 PM  Follow-up for Phone Call        check orthostaticvs at end and call MD if he is orthostatic and then we will provide IV orders Follow-up by: Iva Boop MD, Clementeen Graham,  February 23, 2010 12:51 PM  Additional Follow-up for Phone Call Additional follow up Details #1::        new order sent to short stay.  Per Pam they did receive.  Per Pam they do not have a standing order to do H&H prior to phlebotomy.  What are your perameters for this pt? Additional Follow-up by: Francee Piccolo CMA Duncan Dull),  February 23, 2010 2:40 PM    Additional Follow-up for Phone Call Additional follow up Details #2::    Draw Hgb'/Hct pre-phlebotomy, if Hgb > 12.0   do phlebotomy as ordered 200 cc if below 12 do not do send Hgb/Hct results to Dr. Leone Payor for review each time  Follow-up by: Iva Boop MD, Clementeen Graham,  February 23, 2010 2:56 PM  Additional Follow-up for Phone Call Additional follow up Details #3:: Details for Additional Follow-up Action Taken: advised Pam new order being faxed, she will discard any other orders. Additional Follow-up by: Francee Piccolo CMA Duncan Dull),  February 23, 2010 3:33 PM

## 2010-06-20 NOTE — Progress Notes (Signed)
  Phone Note Outgoing Call   Summary of Call: call pt - blood test shows iron overload condition.   I suggest referral to gastroenterologist.  see order Initial call taken by: D. Thomos Lemons DO,  August 29, 2009 5:19 PM  Follow-up for Phone Call        Left message on home phone for return call Follow-up by: Darral Dash,  August 30, 2009 2:56 PM  Additional Follow-up for Phone Call Additional follow up Details #1::        Left detail message with results  and appt with GI    as pt to return call Additional Follow-up by: Darral Dash,  September 02, 2009 11:36 AM  New Problems: OTHER HEMOCHROMATOSIS (ICD-275.03)   Additional Follow-up for Phone Call Additional follow up Details #2::    call pt again to schedule referral to GI Follow-up by: D. Thomos Lemons DO,  September 11, 2009 4:54 PM  Additional Follow-up for Phone Call Additional follow up Details #3:: Details for Additional Follow-up Action Taken: Spoke with patient appt Dr  Leone Payor  May 18 ,confirmed   Additional Follow-up by: Darral Dash,  September 12, 2009 2:34 PM  New Problems: OTHER HEMOCHROMATOSIS (ICD-275.03)

## 2010-06-20 NOTE — Letter (Signed)
Summary: Primary Care Consult Scheduled Letter  [place] [facility]  [address]   [place], [place] [address]   Phone: [phone]  Fax: [phone]      08/30/2009 MRN: [id number]  Dylan Hudson [address] [place], [place]  [address]    Dear Mr. Grell,      We have scheduled an appointment for you.  At the recommendation of Dr.YOO, we have scheduled you a consult with [facility], DR Leone Payor  on MAY 18TH   at 2:45PM .  Their address is_[name], [place] . The office phone number is [phone] .  If this appointment day and time is not convenient for you, please feel free to call the office of the doctor you are being referred to at the number listed above and reschedule the appointment.     It is important for you to keep your scheduled appointments. We are here to make sure you are given good patient care.     Thank you, Darral Dash Patient Care Coordinator [place] at [facility]

## 2010-06-22 NOTE — Progress Notes (Signed)
Summary: needs ferritin in 1 month  Phone Note Outgoing Call   Summary of Call: let him know iron level is where we want it at this time I need him to have a ferritin again in 1 month (dx is hemochromatosis) Iva Boop MD, [facility]  April 28, 2010 1:23 PM   Follow-up for Phone Call        Pt notified of above results and recommendations.  Pt is agreeable and excited that he does not need any phlebotomies at this time.  Order entered in IDX for labs. Follow-up by: Francee Piccolo CMA Duncan Dull),  April 28, 2010 2:11 PM     Appended Document: needs ferritin in 1 month I spoke with the patient will come for lab work one day this week

## 2010-06-22 NOTE — Op Note (Signed)
Summary: Phlebotomy/[facility]  Phlebotomy/[facility]   Imported By: Sherian Rein 05/03/2010 14:36:03  _____________________________________________________________________  External Attachment:    Type:   Image     Comment:   External Document

## 2010-08-01 LAB — HEMOGLOBIN AND HEMATOCRIT, BLOOD
HCT: 43.7 % (ref 39.0–52.0)
Hemoglobin: 15.6 g/dL (ref 13.0–17.0)

## 2010-08-02 ENCOUNTER — Encounter: Payer: Self-pay | Admitting: Cardiology

## 2010-08-02 ENCOUNTER — Ambulatory Visit (INDEPENDENT_AMBULATORY_CARE_PROVIDER_SITE_OTHER): Payer: 59 | Admitting: Internal Medicine

## 2010-08-02 ENCOUNTER — Encounter: Payer: Self-pay | Admitting: Internal Medicine

## 2010-08-02 ENCOUNTER — Ambulatory Visit (INDEPENDENT_AMBULATORY_CARE_PROVIDER_SITE_OTHER): Payer: 59 | Admitting: Cardiology

## 2010-08-02 DIAGNOSIS — R6882 Decreased libido: Secondary | ICD-10-CM

## 2010-08-02 DIAGNOSIS — I10 Essential (primary) hypertension: Secondary | ICD-10-CM

## 2010-08-02 DIAGNOSIS — E7800 Pure hypercholesterolemia, unspecified: Secondary | ICD-10-CM

## 2010-08-02 DIAGNOSIS — I2510 Atherosclerotic heart disease of native coronary artery without angina pectoris: Secondary | ICD-10-CM

## 2010-08-02 LAB — CONVERTED CEMR LAB
Albumin: 4.6 g/dL (ref 3.5–5.2)
CO2: 27 meq/L (ref 19–32)
CRP: 0.2 mg/dL (ref <0.6)
Chloride: 101 meq/L (ref 96–112)
HDL: 35 mg/dL — ABNORMAL LOW (ref >39)
LDL Cholesterol: 101 mg/dL — ABNORMAL HIGH (ref 0–99)
Sodium: 139 meq/L (ref 135–145)
Testosterone: 401.37 ng/dL (ref 250–890)
Total Bilirubin: 2.6 mg/dL — ABNORMAL HIGH (ref 0.3–1.2)
Total CHOL/HDL Ratio: 5.3

## 2010-08-03 ENCOUNTER — Encounter: Payer: Self-pay | Admitting: Internal Medicine

## 2010-08-03 LAB — HEMOGLOBIN AND HEMATOCRIT, BLOOD: Hemoglobin: 15.1 g/dL (ref 13.0–17.0)

## 2010-08-04 LAB — POCT I-STAT, CHEM 8
Calcium, Ion: 1.14 mmol/L (ref 1.12–1.32)
Chloride: 102 mEq/L (ref 96–112)
Glucose, Bld: 116 mg/dL — ABNORMAL HIGH (ref 70–99)
HCT: 40 % (ref 39.0–52.0)
TCO2: 29 mmol/L (ref 0–100)

## 2010-08-04 LAB — POCT CARDIAC MARKERS: Troponin i, poc: <0.05 ng/mL (ref 0.00–0.09)

## 2010-08-07 LAB — CBC
HCT: 43.8 % (ref 39.0–52.0)
Hemoglobin: 15.1 g/dL (ref 13.0–17.0)
RDW: 12.4 % (ref 11.5–15.5)
WBC: 4.8 10*3/uL (ref 4.0–10.5)

## 2010-08-07 LAB — APTT: aPTT: 33 seconds (ref 24–37)

## 2010-08-08 NOTE — Assessment & Plan Note (Signed)
Summary: F1Y/DM RECALL/OK PER PT CALL/JML/TT   Visit Type:  Follow-up Referring Provider:  Dondra Spry DO Primary Provider:  Dondra Spry DO  CC:  No complaints.  History of Present Illness: Dylan Hudson is a pleasant gentleman who has a history of coronary artery disease.  In 2007, the patient had cardiac catheterization that showed an ejection fraction of 45% with severe anterior apical hypokinesis and severe coronary disease.  He ultimately underwent coronary artery bypassing graft on June 04, 2005.  At that time, he had a saphenous vein graft to the PDA, status vein graft to the diagonal, a RIMA to the OM1, and a LIMA to the LAD.  I last saw him in March of 2011. Since then the patient denies any dyspnea on exertion, orthopnea, PND, pedal edema, palpitations,  or chest pain. He had 2 syncopal episodes both of which occurred while he was being phlebotomized for his hemochromatosis. Note he has had leg pain with all statins to the point that he is unable to ambulate.  Current Medications (verified): 1)  Ra Aspirin 325 Mg  Tabs (Aspirin) .... Take 1 Tablet By Mouth Once A Day 2)  Toprol Xl 50 Mg  Tb24 (Metoprolol Succinate) .... Take 1 Tablet By Mouth Once A Day 3)  Fish Oil 1000 Mg Caps (Omega-3 Fatty Acids) .... Take 1 Capsule By Mouth Two Times A Day 4)  Claritin 10 Mg  Tabs (Loratadine) .... Take 1 Tablet By Mouth Once A Day As Needed 5)  Lisinopril 10 Mg Tabs (Lisinopril) .Marland Kitchen.. 1 Tab By Mouth Once Daily 6)  Zetia 10 Mg Tabs (Ezetimibe) .... Take One Tablet By Mouth Daily.  Allergies: 1)  Simvastatin (Simvastatin)  Past History:  Past Medical History: Reviewed history from 02/17/2010 and no changes required. 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 Coronary artery bypass graft Median sternotomy for coronary artery bypass grafting x4 (left internal mammary artery to distal left anterior descending coronary artery, right internal mammary artery to first circumflex marginal branch, saphenous  vein graft to posterior descending coronary artery, saphenous vein graft to diagonal branch, endoscopic saphenous vein harvest from right thigh). SURGEON:  Salvatore Decent. Cornelius Moras, M.D. CHO/MEDQ  D:  06/08/2005  T:  06/09/2005  Job:  [id number]    Left Knee Arthroscopy    Social History: Reviewed history from 10/05/2009 and no changes required. 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  Smoked since age 55 approximately 1 pack to 1  packs a day.  Fortunately after his bypass surgery he completely discontinued smoking. Divorced, lives alone [place] :  fishing     Review of Systems       no fevers or chills, productive cough, hemoptysis, dysphasia, odynophagia, melena, hematochezia, dysuria, hematuria, rash, seizure activity, orthopnea, PND, pedal edema, claudication. Remaining systems are negative.   Vital Signs:  Patient profile:   50 year old male Height:      72 inches Weight:      222.50 pounds BMI:     30.29 Pulse rate:   54 / minute Pulse rhythm:   regular Resp:     18 per minute BP sitting:   120 / 78  (left arm) Cuff size:   large  Vitals Entered By: Doree Fudge  Jaramillo (August 02, 2010 8:45 AM)  Physical Exam  General:  Well-developed well-nourished in no acute distress.  Skin is warm and dry.  HEENT is normal.  Neck is supple. No thyromegaly.  Chest is clear to auscultation with normal expansion.  Cardiovascular exam is regular rate and rhythm.  Abdominal exam nontender or distended. No masses palpated. Extremities show no edema. neuro grossly intact    EKG  Procedure date:  08/02/2010  Findings:      Sinus bradycardia, no ST  changes.  Impression & Recommendations:  Problem # 1:  SYNCOPE (ICD-780.2) Occurred in the setting of blood draw. Most likely vagal episodes. His updated medication list for this problem includes:    Ra Aspirin 325 Mg Tabs (Aspirin) .Marland Kitchen... Take 1 tablet by mouth once a day    Toprol Xl 50 Mg Tb24 (Metoprolol succinate) .Marland Kitchen... Take 1 tablet by mouth once a day    Lisinopril 10 Mg Tabs (Lisinopril) .Marland Kitchen... 1 tab by mouth once daily  Orders: Echocardiogram (Echo)  His updated medication list for this problem includes:    Ra Aspirin 325 Mg Tabs (Aspirin) .Marland Kitchen... Take 1 tablet by mouth once a day    Toprol Xl 50 Mg Tb24 (Metoprolol succinate) .Marland Kitchen... Take 1 tablet by mouth once a day    Lisinopril 10 Mg Tabs (Lisinopril) .Marland Kitchen... 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  Problem # 3:  CORONARY ARTERY DISEASE (ICD-414.00) Continue aspirin, beta blocker, ACE inhibitor. Intolerant to statins. His updated medication list for this problem includes:    Ra Aspirin 325 Mg Tabs (Aspirin) .Marland Kitchen... Take 1 tablet by mouth once a day    Toprol Xl 50 Mg Tb24 (Metoprolol succinate) .Marland Kitchen... Take 1 tablet by mouth once a day    Lisinopril 10 Mg Tabs (Lisinopril) .Marland Kitchen... 1 tab by mouth once daily  Problem # 4:  HYPERLIPIDEMIA (ICD-272.4) Continue present medications. Check lipids and liver. His updated medication list for this problem includes:    Zetia 10 Mg Tabs (Ezetimibe) .Marland Kitchen... Take one tablet by mouth daily.  Problem # 5:  HYPERTENSION (ICD-401.9) Blood pressure controlled. Continue present medications. Check potassium and renal function. His updated medication list for this problem includes:    Ra Aspirin 325 Mg Tabs (Aspirin) .Marland Kitchen... Take 1 tablet by mouth once a day    Toprol Xl 50 Mg Tb24 (Metoprolol succinate) .Marland Kitchen... Take 1 tablet by mouth once a day    Lisinopril 10 Mg Tabs (Lisinopril) .Marland Kitchen... 1 tab by mouth  once daily  Patient Instructions: 1)  Your physician wants you to follow-up in: ONE YEAR  You will receive a reminder letter in the mail two months in advance. If you don't receive a letter, please call our office to schedule the follow-up appointment. 2)  Your physician has requested that you have an echocardiogram.  Echocardiography is a painless test that uses sound waves to create images of your heart. It provides your doctor with information about the size and shape of your heart and how well your heart's chambers and valves are working.  This procedure takes approximately one hour. There are no restrictions for this procedure. 3)  LIPID/LIVER/BMP

## 2010-08-08 NOTE — Letter (Signed)
   [place] [facility] [address] [place], [place]  [address]  [place] Phone: [phone]      August 03, 2010   JAKOBI THETFORD [address] [place], [place] [address]  RE:  LAB RESULTS  Dear  Mr. Calender,  The following is an interpretation of your most recent lab tests.  Please take note of any instructions provided or changes to medications that have resulted from your lab work.  ELECTROLYTES:  Good - no changes needed  KIDNEY FUNCTION TESTS:  Good - no changes needed  LIVER FUNCTION TESTS:  Stable - no changes needed  LIPID PANEL:  Fair - review at your next visit Triglyceride: 256   Cholesterol: 187   LDL: 101   HDL: 35   Chol/HDL%:  5.3 Ratio   Testosterone levels - normal       Sincerely Yours,    Dr. Thomos Lemons  Appended Document:  mailed

## 2010-08-11 ENCOUNTER — Other Ambulatory Visit (HOSPITAL_COMMUNITY): Payer: Self-pay | Admitting: Cardiology

## 2010-08-11 DIAGNOSIS — R55 Syncope and collapse: Secondary | ICD-10-CM

## 2010-08-14 ENCOUNTER — Ambulatory Visit (HOSPITAL_COMMUNITY): Payer: 59 | Attending: Cardiology

## 2010-08-14 DIAGNOSIS — I10 Essential (primary) hypertension: Secondary | ICD-10-CM | POA: Insufficient documentation

## 2010-08-14 DIAGNOSIS — I2510 Atherosclerotic heart disease of native coronary artery without angina pectoris: Secondary | ICD-10-CM | POA: Insufficient documentation

## 2010-08-14 DIAGNOSIS — E785 Hyperlipidemia, unspecified: Secondary | ICD-10-CM | POA: Insufficient documentation

## 2010-08-14 DIAGNOSIS — R55 Syncope and collapse: Secondary | ICD-10-CM

## 2010-08-14 DIAGNOSIS — I252 Old myocardial infarction: Secondary | ICD-10-CM | POA: Insufficient documentation

## 2010-08-15 ENCOUNTER — Telehealth: Payer: Self-pay | Admitting: *Deleted

## 2010-08-15 NOTE — Telephone Encounter (Signed)
pt aware of results  

## 2010-08-15 NOTE — Telephone Encounter (Signed)
Message copied by Deliah Goody on Tue Aug 15, 2010  3:21 PM ------      Message from: Olga Millers      Created: Mon Aug 14, 2010  6:31 PM       ok

## 2010-08-21 ENCOUNTER — Other Ambulatory Visit: Payer: Self-pay | Admitting: Cardiology

## 2010-08-22 NOTE — Assessment & Plan Note (Signed)
Summary: 6 month follow up/mhf   Vital Signs:  Patient profile:   50 year old male Height:      72 inches Weight:      222.50 pounds BMI:     30.29 O2 Sat:      98 % on Room air Temp:     97.7 degrees F oral Pulse rate:   54 / minute Resp:     18 per minute BP sitting:   118 / 70  (right arm) Cuff size:   large  Vitals Entered By: Glendell Docker CMA (August 02, 2010 9:17 AM)  O2 Flow:  Room air  Primary Care Provider:  D. Thomos Lemons DO   History of Present Illness:  Hypertension Follow-Up      This is a 50 year old man who presents for Hypertension follow-up.  The patient reports low libido, but denies lightheadedness and headaches.  The patient denies the following associated symptoms: chest pain.  Compliance with medications (by patient report) has been near 100%.  The patient reports that dietary compliance has been fair.    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 SURGEON:  Salvatore Decent. Cornelius Moras, M.D. CHO/MEDQ  D:  06/08/2005  T:  06/09/2005  Job:  [id number]      Left Knee Arthroscopy    Family History: Mother and father are living.  Father has some issues also with vertigo.   Questionable history of Gilbert's syndrome in the family.  There is no family history of colon cancer or breast or prostate cancer.  Patient has siblings that are in good health.     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  Smoked since age 37 approximately 1 pack to 1  packs a day.  Fortunately after his bypass surgery he completely discontinued smoking. Divorced, lives alone Hobby :  fishing       Review of Systems       occ shoulder pain - L > R low libido but denies sexual dysfunction  Physical Exam  General:  alert, well-developed, and well-nourished.   Head:  normocephalic and atraumatic.   Lungs:  normal respiratory effort and normal breath sounds.   Heart:  normal rate, regular rhythm, and no gallop.   Abdomen:  soft, non-tender, and normal bowel sounds.   Genitalia:  no scrotal masses and no testicular masses or atrophy.   Extremities:  No lower extremity edema  Neurologic:  cranial nerves II-XII intact, gait normal, and DTRs symmetrical and normal.     Impression & Recommendations:  Problem # 1:  HYPERTENSION (ICD-401.9) Assessment Unchanged  His updated medication list for this problem includes:    Toprol Xl 50  Mg Tb24 (Metoprolol succinate) .Marland Kitchen... Take 1 tablet by mouth once a day    Lisinopril 10 Mg Tabs (Lisinopril) .Marland Kitchen... 1 tab by mouth once daily  Orders: T-Basic Metabolic Panel [phone]) T-Lipid Profile [phone]) CRP, high sensitivity-[facility] [phone])  BP today: 118/70 Prior BP: 120/78 (08/02/2010)  Labs Reviewed: K+: 3.7 (10/14/2008) Creat: : 0.9 (10/14/2008)   Chol: 178 (09/30/2009)   HDL: 29.50 (09/30/2009)   LDL: 87 (07/14/2009)   TG: 204.0 (09/30/2009)  Problem # 2:  LIBIDO, DECREASED (ICD-799.81)  Orders: T-Testosterone; Total [phone])  Problem # 3:  HEREDITARY HEMOCHROMATOSIS  (ICD-275.01) Assessment: Improved pt getting periodic phlebotomy Orders: T-Testosterone; Total [phone]) T-Hepatic Function ([phone])  Complete Medication List: 1)  Ra Aspirin 325 Mg Tabs (Aspirin) .... Take 1 tablet by mouth once a day 2)  Toprol Xl 50 Mg Tb24 (Metoprolol succinate) .... Take 1 tablet by mouth once a day 3)  Fish Oil 1000 Mg Caps (Omega-3 fatty acids) .... Take 1 capsule by mouth two times a day 4)  Claritin 10 Mg Tabs (Loratadine) .... Take 1 tablet by mouth once a day as needed 5)  Lisinopril 10 Mg Tabs (Lisinopril) .Marland Kitchen.. 1 tab by mouth once daily 6)  Zetia 10 Mg Tabs (Ezetimibe) .... Take one tablet by mouth daily.  Other Orders: T-PSA ([id number])  Patient Instructions: 1)  Please schedule a follow-up appointment in 6 months.   Orders Added: 1)  T-Basic Metabolic Panel [[id number]] 2)  T-Lipid Profile [[id number]] 3)  CRP, high sensitivity-[facility] [[id number]] 4)  T-Testosterone; Total [phone] 5)  T-PSA [[id number]] 6)  T-Hepatic Function [[id number]] 7)  Est. Patient Level III [35573]   Immunization History:  Influenza Immunization History:    Influenza:  declined (08/02/2010)   Contraindications/Deferment of Procedures/Staging:    Test/Procedure: FLU VAX    Reason for deferment: patient declined   Immunization History:  Influenza Immunization History:    Influenza:  Declined (08/02/2010)  Current Allergies (reviewed today): SIMVASTATIN (SIMVASTATIN)

## 2010-09-26 ENCOUNTER — Other Ambulatory Visit: Payer: Self-pay | Admitting: Cardiovascular Disease

## 2010-09-28 ENCOUNTER — Telehealth: Payer: Self-pay | Admitting: Internal Medicine

## 2010-09-28 ENCOUNTER — Telehealth: Payer: Self-pay | Admitting: *Deleted

## 2010-09-28 MED ORDER — METOPROLOL SUCCINATE ER 50 MG PO TB24: 50.0000 mg | ORAL_TABLET | Freq: Every day | ORAL | Status: DC

## 2010-09-28 NOTE — Telephone Encounter (Signed)
Refill sent to pharmacy.   

## 2010-09-28 NOTE — Telephone Encounter (Signed)
METPROLOL ER SUCCINATE 50 MG TABLET TOPROL XL 50 MG TAB TAKE 1 TABLET BY MOUTH DAILY QTY 30 LAST FILL 08-21-4010

## 2010-10-03 NOTE — Assessment & Plan Note (Signed)
[facility]                            CARDIOLOGY OFFICE NOTE   ARSALAN, BRISBIN                       MRN:          [id number]  DATE:04/02/2007                            DOB:          [birth year]    Mr. Eplin is a very pleasant gentleman with a history of coronary  disease.  He underwent coronary artery bypass and graft in January of  2007.  Note, his left ventricular function is in the 45% range.  Since I  last saw him, he denies any dyspnea on exertion, orthopnea, PND, pedal  edema, palpitations, presyncope, syncope, or chest pain.  He is  exercising routinely as well as trying to follow a diet.  He  discontinued his tobacco use at the time of his surgery.   MEDICATIONS:  1. Aspirin 325 mg p.o. daily.  2. Toprol 50 mg p.o. daily.  3. Lisinopril 5 mg p.o. daily.  4. Fish oil.  5. Crestor 10 mg p.o. daily.   PHYSICAL EXAMINATION:  Shows a blood pressure of 122/70 and his pulse is  64.  He weighs 216 pounds.  His HEENT is normal.  His neck is supple.  There are no bruits noted.  His chest is clear.  His cardiovascular exam reveals a regular rate and rhythm.  His abdominal exam shows no tenderness.  His extremities show no edema.   Electrocardiogram today reveals sinus rhythm at a rate of 66.  The axis  is normal.  There are no significant ST changes.   DIAGNOSES:  1. Coronary artery disease, status post coronary artery bypass and      graft:  Mr. Yale is doing well from a symptomatic standpoint.  He      has had no chest pain or shortness of breath.  We will continue      with medical therapy including his aspirin, statin, angiotensin-      converting enzyme inhibitor, and beta blocker.  He will continue      with risk factor modification including diet and exercise.  Note,      he has discontinued his tobacco use.  2. History of increased liver functions:  He had blood drawn recently.      We will have those results forwarded to [place]  for our records.  3. Hyperlipidemia:  He will continue on a statin and we will await his      recent lipids and liver.  4. Hypertension:  His blood pressure is adequately controlled.   We will have him return to see [place] in 12 months.     Madolyn Frieze Jens Som, MD, [facility]  Electronically Signed    BSC/MedQ  DD: 04/02/2007  DT: 04/02/2007  Job #: [id number]

## 2010-10-03 NOTE — Assessment & Plan Note (Signed)
[facility]                            CARDIOLOGY OFFICE NOTE   HYMIE, GORR                       MRN:          [id number]  DATE:03/29/2008                            DOB:          [birth year]    Mr. Sprung is a pleasant gentleman who has a history of coronary artery  disease.  In 2007, the patient had cardiac catheterization that showed  an ejection fraction of 45% with severe anterior apical hypokinesis and  severe coronary disease.  He ultimately underwent coronary artery  bypassing graft on June 04, 2005.  At that time, he had a saphenous  vein graft to the PDA, status vein graft to the diagonal, a RIMA to the  OM1, and a LIMA to the LAD.  Since he was last seen, he is doing well  symptomatically.  He denies any dyspnea, chest pain, palpitations, or  syncope.  There is no pedal edema.   Medications include:  1. Aspirin 325 mg p.o. daily.  2. Toprol 50 mg p.o. daily.  3. Lisinopril 5 mg p.o. daily.  4. Fish oil.   Note, he has had problems with ZOCOR causing myalgias.   PHYSICAL EXAMINATION:  VITAL SIGNS:  Today shows a blood pressure of  129/91 and his pulse of 69.  He weighs 222 pounds.  HEENT:  Normal.  NECK:  Supple.  No bruits.  CHEST:  Clear.  CARDIOVASCULAR:  Regular rate and rhythm.  ABDOMEN:  No tenderness, and there is no bruit noted.  EXTREMITIES:  No edema.   Electrocardiogram shows a sinus rhythm at a rate of 56.  The axis is  normal.  There is poor R-wave progression, but there are no ST changes  noted.   DIAGNOSES:  1. Coronary artery disease status post coronary bypassing graft - Mr.      Claiborne is doing well from a symptomatic standpoint with no chest      pain or shortness breath.  He will continue with his aspirin, beta-      blocker, and ACE inhibitor.  He has had myalgias with Zocor.  He      was concerned about the cause of the other medications including      Crestor and Lipitor.  I will try Pravachol 40  mg p.o. daily.  We      will check lipids and liver in 6 weeks and adjust as indicated.  2. Hypertension - his blood pressure is minimally elevated.  We will      track this and increase his medications as indicated.  3. Hyperlipidemia - as above we are adding Pravachol and we will check      lipids and liver in 6 weeks.  Given his ACE inhibitor use, we will      also check a BMET.  4. History of mildly increased liver functions - this is being      followed by his primary care physician.   We discussed risk factor modification including diet and exercise.  He  does not smoke.  I will see him back in 1 year.  Madolyn Frieze Jens Som, MD, [facility]  Electronically Signed    BSC/MedQ  DD: 03/29/2008  DT: 03/29/2008  Job #: [id number]

## 2010-10-04 NOTE — Telephone Encounter (Signed)
Close encounter./cy

## 2010-10-06 NOTE — Cardiovascular Report (Signed)
NAME:  DEMITRIOUS, MCCANNON NO.:  [id number]   MEDICAL RECORD NO.:  [id number]          PATIENT TYPE:  INP   LOCATION:  2902                         FACILITY:  [facility]   PHYSICIAN:  Arvilla Meres, M.D. [facility] OF BIRTH:  03/03/1961   DATE OF PROCEDURE:  06/04/2005  DATE OF DISCHARGE:                              CARDIAC CATHETERIZATION   PRIMARY CARE PHYSICIAN:  Dr. Sheryn Bison.   CARDIOLOGIST:  Dr. Olga Millers.   PATIENT IDENTIFICATION:  Mr. Santerre is a 50 year old man with history of  heavy ongoing tobacco and alcohol use but no known history of coronary  disease. There was a question of a remote history of GI bleeding. He  presented to the ER with a non-ST elevation myocardial infarction. He is  thus referred for cardiac catheterization.   PROCEDURES PERFORMED:  1.  Selective coronary angiography.  2.  Left heart cath.  3.  Left ventriculogram.  4.  Left subclavian angiography.  5.  AngioSeal femoral closure device.   DESCRIPTION OF PROCEDURE:  The risks, benefits of catheterization were  explained to Mr. Dubray; consent was signed and placed in the chart. Standard  preformed Judkins JL-5, JR-4 and angled pigtail were used for the procedure.  All catheter exchanges made over wire. There were no apparent complications.  Central aortic pressure was 98/69 with a mean of 82. LV pressure is 112/40  with an EDP of 14. There was no aortic stenosis on pullback.   Left main was free of critical stenosis.   LAD was a long vessel that coursed to the apex that gave off a very large  branching diagonal. In the proximal portion of the LAD, there was 70-80%  tubular stenosis. Following this, there was a focal 99% stenosis just before  the takeoff of the diagonal. After the diagonal, the mid to distal vessel  was diffusely narrowed.   Left circumflex was a large codominant system. It gave off a large branching  ramus, a moderate-sized OM-1, two small to moderate  PL's and a small PDA. In  the ramus there was a 70% lesion in the upper smaller branch and a focal 99%  lesion in the lower branch. In the mid left circumflex after the takeoff of  the OM-1 there was a 30% tubular stenosis.   Right coronary artery was totally occluded distally. There was evidence of  left-to-right collaterals filling this small to moderate-sized PDA.   Left ventriculogram showed an EF of approximately 45% with no mitral  regurgitation. There was severe anterior apical hypokinesis.   ASSESSMENT:  1.  A codominant system with severe three-vessel disease as described above.  2.  Mildly depressed ejection fraction.   PLAN:  1.  For CVTS consult for bypass surgery. I have reviewed the films with Dr.      Juanda Chance who agrees that this is probably the best option.  2.  He will need aggressive risk factor modification.      Arvilla Meres, M.D. [facility]  Electronically Signed     DB/MEDQ  D:  06/04/2005  T:  06/04/2005  Job:  [id number]  cc:   Vania Rea. Jarold Motto, M.D. [facility]  [address]  [place]  [place] [address]   Olga Millers, M.D. [facility]  [address]  [address]  [place]  [place] [address]

## 2010-10-06 NOTE — H&P (Signed)
NAME:  Dylan Hudson, Dylan Hudson NO.:  [id number]   MEDICAL RECORD NO.:  [id number]          PATIENT TYPE:  INP   LOCATION:  1828                         FACILITY:  [facility]   PHYSICIAN:  Lorain Childes, M.D. LHCDATE OF BIRTH:  Jan 15, 1961   DATE OF ADMISSION:  06/03/2005  DATE OF DISCHARGE:                                HISTORY & PHYSICAL   PRIMARY CARE PHYSICIAN:  Dr. Jarold Motto   CHIEF COMPLAINT:  Chest pain.   HISTORY OF PRESENT ILLNESS:  The patient is a 50 year old gentleman  __________ who presents to the emergency room for evaluation of chest pain.  Patient reports that on Friday evening, June 01, 2005 he began having  chest pain around 10:30 p.m.  He described it as a burning sensation in his  mid-sternum and also associated pressure.  It did radiate to his left arm  just above his elbow.  He had some burping and eventually the chest  discomfort relieved on its own without any intervention by the morning.  He  went to bed and felt well the following morning.  Through the day on  Saturday he felt okay and then after eating around 8:30 p.m., he had severe  chest burning.  He reports that he ate spicy shrimp which he thought caused  his symptoms.  He then had significant gaseous discomfort and tried to burp  to improve his symptoms.  Unfortunately, this did not help significantly.  He bought Rolaids but this did not give  significant improvement.  He  reports that it eventually improved somewhat on its own when he was lying  down in bed, but did not relieve completely.  This morning around 1:30 a.m.  he began having chest pain, rated at 5/10.  He described it as a pressure  sensation.  It did radiate down in his arms.  It was associated with nausea.  No shortness of breath and vomiting.  He did have diaphoresis with it.  With  that his wife brought him into the emergency room for further evaluation.  He currently reports minimal discomfort, approximately a 2/10.   On further  questioning he reports he has had no prior episodes similar to this.  He has  been fairly active without difficulty.  He denies any palpitations.  No  syncope.  No lower extremity edema.  No PND, orthopnea, shortness of breath  recently.   PAST MEDICAL HISTORY:  Unremarkable.  He had a colonoscopy done  approximately two years ago and had a polyp removed at that time.   MEDICATIONS:  None.   ALLERGIES:  No known drug allergies.   SOCIAL HISTORY:  He lives in [place] with his wife.  He smokes one to one  and a half packs per day for the past 30 years.  He drinks alcohol four to  five times per week, two to three beers at a time.  He denies any  withdrawals or seizures related to alcohol.  He denies any drugs or herbal  medications.  He does not follow any particular diet.  He does not do any  regular exercise.   FAMILY HISTORY:  His mother is alive at the age of 6 and is healthy.  Father is alive at the age of 72 and is healthy.  He had a grandfather who  had an MI at the age of 20.  His siblings are well.   REVIEW OF SYSTEMS:  He denies any fevers, chills.  No sweats.  No weight  changes.  No headache or visual changes.  No skin rashes or lesions.  He  denies any cough or significant wheezing.  He reports occasional calf  discomfort with walking.  Chest pain as reported in the HPI.  Rare  palpitations.  No syncope.  He denies any urinary symptoms.  No neuropsych,  no depression, no focal weakness.  He reports loose stools for the past day.  He reports occasional blood on his tissue after having a bowel movement, but  denies any melena.  He had no prior history of heartburn symptoms.  All  other systems are negative.   PHYSICAL EXAMINATION:  VITAL SIGNS:  Temperature 98, pulse 88, respirations  16, blood pressure 140/92.  He is saturating 96% on 2 L nasal cannula.  GENERAL:  He is a very pleasant, well-developed man, comfortable, in no  acute distress.  HEENT:   Normocephalic, atraumatic.  NECK:  His JVP is normal.  He has no carotid bruits.  He has 2+ carotid  upstrokes.  CARDIOVASCULAR:  Normal S1, split S2.  Regular rate and rhythm.  He has no  murmurs appreciated.  His pulses are 2+ throughout and equal.  There are no  bruits.  LUNGS:  Clear to auscultation bilaterally.  ABDOMEN:  Soft, positive bowel sounds, nontender.  No organomegaly.  EXTREMITIES:  He has no edema.  He has 2+ distal pulses.  RECTAL:  He has heme-positive brown stool, loose.  No masses appreciated.   Chest x-ray shows no acute air space disease.  EKG shows regular sinus  rhythm, normal axis.  PR interval is __________ milliseconds, QRS is 92  milliseconds, and QTc is 432 milliseconds.  He has Q-waves in the anterior  and lateral leads with T-wave inversions in V4 and 5.  We have no EKG for  comparison.   LABORATORY DATA:  White count 7.1, hematocrit 44.5, platelet count 183.  His  creatinine is 0.8, potassium 3.3.  Point of care cardiac enzymes show a  myoglobin of greater than 500, CK-MB of 29.3, and troponin of 0.53.   ASSESSMENT/PLAN:  Patient is a 50 year old gentleman admitted with chest  pain past two days, found to have a myocardial infarction.  1.  Coronary artery disease.  Patient with an acute myocardial infarction.      Will admit him to stepdown.  Will follow his EKG and follow him on      telemetry.  His EKG has evidence of anterolateral myocardial infarction      with Q-waves present.  However, the timing of this is not certain as      there is no acute ST elevation present.  We will manage him as a non-ST      segment elevation myocardial infarction with aspirin, heparin,      Integrilin, beta blocker, ACE inhibitor, and statin.  We will continue      the nitroglycerin which was started in the emergency room.  Will plan on      catheterizing him in the next one to two days ___________ EKG changes.     With his  troponin being positive, the event most  likely occurred on      Friday night when he was having symptoms.  Will check a lipid panel and      have strongly encouraged smoking cessation.  2.  Tobacco abuse.  We discussed in detail the need for him to stop smoking.      Patient and his wife agree.  Will place a smoking cessation consult.  3.  Alcohol abuse.  He reports moderate heavy use.  He denies any prior      withdrawal symptoms.  Will give him Xanax p.r.n. and monitor him      closely.  4.  Guaiac-positive stool.  His stool is brown.  He has had a colonoscopy in      the last two years without any significant findings by his and his      wife's report__________.  He does report diarrhea for the past day.  He      likely had mild mucosal irritation related to that.  We will follow the      patient closely and monitor him for any bleeding as he will be on      anticoagulation.  Also, will start him on Protonix which will help with      his upper gastrointestinal disorder.  5.  Hypokalemia.  Potassium is low.  We will supplement this and monitor.          ______________________________  Lorain Childes, M.D. [facility]    CGF/MEDQ  D:  06/03/2005  T:  06/04/2005  Job:  [id number]

## 2010-10-06 NOTE — Consult Note (Signed)
NAME:  Dylan Hudson, Dylan Hudson NO.:  [id number]   MEDICAL RECORD NO.:  [id number]          PATIENT TYPE:  INP   LOCATION:  2902                         FACILITY:  [facility]   PHYSICIAN:  Salvatore Decent. Cornelius Moras, M.D. DATE OF BIRTH:  12-Dec-1960   DATE OF CONSULTATION:  06/04/2005  DATE OF DISCHARGE:                                   CONSULTATION   REQUESTING PHYSICIAN:  Dr. Nicholes Mango.   PRIMARY CARDIOLOGIST:  Dr. Olga Millers.   PRIMARY CARE PHYSICIAN:  Dr. Jarold Motto.   REASON FOR CONSULTATION:  Severe three-vessel coronary artery disease,  status post acute myocardial infarction.   HISTORY OF PRESENT ILLNESS:  Dylan Hudson is a 50 year old white male with no  previous history of coronary artery disease but risk factors notable for  longstanding history of tobacco abuse, hyperlipidemia, and a family history  of coronary artery disease. The patient states he was in his usual state of  health until Friday January12, 2007. That evening he developed some burning  substernal chest pain following supper. It resolved uneventfully. The  following day, he had recurrence of similar pain in the evening that waxed  and waned until early Sunday morning when he developed more severe  substernal chest pain. He presented to the emergency room here at [facility]. By the time of arrival, his pain had resolved. Resting  electrocardiogram revealed normal sinus rhythm with no acute ST-segment  changes but signs suggestive of previous anterior lateral myocardial  infarction. Cardiac enzymes were positive with a peak total CK 992 with a CK-  MB fraction of 157.1 and troponin-I of 15.1. The patient was admitted to the  hospital and has remained medically stable since that time. He underwent  cardiac catheterization today by Dr. Gala Romney. He was found to have severe  three-vessel coronary artery disease with mild left ventricular dysfunction.  Cardiac surgical consultation has been  requested.   REVIEW OF SYSTEMS:  GENERAL:  The patient reports normal appetite. He has  lost approximately 20 pounds over the last year but this has been  intentional with dietary modification. CARDIAC:  The patient denies any  previous history of chest pain prior to that described above. The patient  denies shortness of breath either with activity or rest. The patient denies  PND, orthopnea, or lower extremity edema. He has had occasional palpitations  but denies syncopal episodes. RESPIRATORY:  Negative. The patient denies  productive cough, hemoptysis, wheezing. GASTROINTESTINAL:  Negative. The  patient has no difficulty swallowing. He reports occasional small amounts of  hematochezia. He denies melena. He denies history of hematemesis. He denies  problems with constipation or diarrhea. He had some hemorrhoids in the past  and also had a benign polyp removed at the time of colonoscopy two years  ago. MUSCULOSKELETAL:  Negative. NEUROLOGIC:  Negative. GENITOURINARY:  Negative. HEENT:  Negative. PSYCHIATRIC:  Negative.   PAST MEDICAL HISTORY:  1.  Longstanding tobacco abuse.  2.  Mild hyperlipidemia.  3.  Hemoccult positive stool discovered this hospitalization.  4.  Benign colonic polyp status post excision.   PAST  SURGICAL HISTORY:  Right knee arthroscopy several years ago.   FAMILY HISTORY:  There are numerous family members with coronary artery  disease, several of whom have undergone coronary bypass surgery.   SOCIAL HISTORY:  The patient is divorced and lives with his son. 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 The patient smokes between one and one-half pack of  cigarettes per day. He drinks beer nearly every day somewhere between 2 and  10 cans per day.   MEDICATIONS PRIOR TO ADMISSION:  None.   DRUG ALLERGIES:  None known.   PHYSICAL EXAM:  The patient is a well-appearing male who appears his  stated  age in no acute distress. He is currently afebrile and normotensive. He is  normal sinus rhythm. HEENT exam is grossly unremarkable. The neck is supple.  There is no cervical nor supraclavicular lymphadenopathy. There is no  jugular venous distension. No carotid bruits are noted. Auscultation of the  chest reveals clear breath sounds bilaterally with a few inspiratory  crackles. No wheezes or rhonchi noted. Cardiovascular exam is notable for  regular rate and rhythm. No murmurs, rubs or gallops are appreciated.  Abdomen is mildly obese but soft, nontender. There are no palpable masses.  Bowel sounds are present. Extremities are warm and well-perfused. There is  no lower extremity edema. Pulses are palpable in both lower legs in the  posterior tibial position. There is no sign of significant venous  insufficiency. Rectal and GU exams are both deferred. Neurologic examination  is grossly nonfocal. The skin is clean, dry and healthy-appearing  throughout.   DIAGNOSTIC TEST:  Cardiac catheterization performed by Dr. Gala Romney today  is reviewed. This demonstrates severe three-vessel coronary artery disease  with coronary anatomy completely unfavorable for percutaneous coronary  intervention. Specifically, there is 70-80% proximal stenosis of the left  anterior descending coronary artery with 95% stenosis of the distal left  anterior descending coronary artery near the takeoff of the diagonal branch.  There is 80% stenosis after takeoff of the diagonal branch. There is 95%  proximal stenosis of a large ramus intermediate branch. There is codominant  coronary circulation. There is 100% proximal occlusion of right coronary  artery with late left-to-right collateral filling of the distal right  coronary artery. Whether or not the distal right coronary artery or its  branches will be graftable of the time of surgery is difficult to say. Left ventricular function is mildly reduced with  anterior apical hypokinesis.  Ejection fraction is estimated 50%.   IMPRESSION:  Severe three-vessel coronary artery disease with recent acute  myocardial infarction. I believe that [name] would best be treated by  coronary artery bypass grafting.   PLAN:  I have outlined options at length with Mr. Garofano and his family.  Alternative treatment strategies have been discussed. He understands and  accepts all associated risks of surgery including but not limited to risk of  death, stroke, myocardial infarction, congestive heart failure, respiratory  failure, pneumonia, bleeding requiring blood  transfusions, arrhythmia, infection, and recurrent coronary artery disease.  He also understands the mandatory nature of the fact that he needs to find a  way to quit smoking completely. All their questions been addressed. I could  proceed with surgery later this week or perhaps sooner by one of my partners  as schedule permits.      Salvatore Decent. Cornelius Moras, M.D.  Electronically Signed     CHO/MEDQ  D:  06/04/2005  T:  06/05/2005  Job:  [id number]   cc:   Arvilla Meres, M.D. [facility]  [facility]  [address]  [place]  [place] [address]   Olga Millers, M.D. [facility]  [address]  [address]  [place]  [place] [address]   Vania Rea. Jarold Motto, M.D. [facility]  [address]  [place]  [place] [address]

## 2010-10-06 NOTE — Assessment & Plan Note (Signed)
[facility]                              CARDIOLOGY OFFICE NOTE   [name], [name]                       MRN:          [id number]  DATE:03/27/2006                            DOB:          1961-04-06    [name] is a pleasant gentleman who has a history of coronary disease,  status post coronary artery bypassing graft.  His LV function was in the 45%  range.  Since I last saw him, he is doing extremely well.  There is no  dyspnea, chest pain, palpitations, syncope, or claudication.   MEDICATIONS:  1. Aspirin 325 mg p.o. daily.  2. Crestor 20 mg p.o. every day.  3. Lisinopril 5 mg p.o. every day.  4. Toprol 50 mg p.o. every day.   PHYSICAL EXAMINATION:  VITAL SIGNS:  Shows a blood pressure of 128/86 and  his pulse is 76.  He weighs 208 pounds.  NECK:  Supple with no bruits.  CHEST:  Clear  CARDIOVASCULAR:  Revevals a regular rate and rhythm.  ABDOMEN:  Shows no pulsatile masses, no bruits.  EXTREMITIES:  Show no edema.   His electrocardiogram shows a sinus rhythm, rate of 76.  There are no ST  changes noted.   DIAGNOSES:  1. Coronary artery disease, status post coronary artery bypassing graft.  2. History of mildly elevated bilirubin, probable __________ .  3. Mildly reduced left ventricular function.  4. Hyperlipidemia.   PLAN:  [name] is doing extremely well from a symptomatic standpoint.  He  is exercising routinely and has discontinued his tobacco use.  He is also  following a diet.  We will plan to repeat lab work today including lipids as  well as a C-MET and __________  .  He has had elevated bilirubin in the past  most likely related to __________  .  Note, GGT was normal.  If his LDL  remains mildly elevated and his HDL low then we may add Niaspan.  He will  see [place] back in one year.    ______________________________  Madolyn Frieze. Jens Som, MD, [facility]    BSC/MedQ  DD: 03/27/2006  DT: 03/27/2006  Job #: [id number]

## 2010-10-06 NOTE — Op Note (Signed)
NAME:  Dylan Hudson, Dylan Hudson NO.:  [id number]   MEDICAL RECORD NO.:  [id number]          PATIENT TYPE:  INP   LOCATION:  2302                         FACILITY:  [facility]   PHYSICIAN:  Salvatore Decent. Cornelius Moras, M.D. DATE OF BIRTH:  07-17-1960   DATE OF PROCEDURE:  06/08/2005  DATE OF DISCHARGE:                                 OPERATIVE REPORT   PREOPERATIVE DIAGNOSIS:  Severe three-vessel coronary artery disease, status  post acute myocardial infarction.   POSTOPERATIVE DIAGNOSIS:  Severe three-vessel coronary artery disease,  status post acute myocardial infarction.   PROCEDURE:  Median sternotomy for coronary artery bypass grafting x4 (left  internal mammary artery to distal left anterior descending coronary artery,  right internal mammary artery to first circumflex marginal branch, saphenous  vein graft to posterior descending coronary artery, saphenous vein graft to  diagonal branch, endoscopic saphenous vein harvest from right thigh).   SURGEON:  Salvatore Decent. Cornelius Moras, M.D.   ASSISTANT:  Jerold Coombe, P.A.   SECOND ASSISTANT:  Charlesetta Garibaldi, P.A.   ANESTHESIA:  General.   BRIEF CLINICAL NOTE:  The patient is a 50 year old male with no previous  cardiac history, but multiple risk factors, who suffered an acute non-ST-  segment-elevation myocardial infarction for which he was admitted to the  hospital.  The patient underwent elective cardiac catheterization on June 04, 2005 by Dr. Nicholes Mango.  He was found to have severe three-vessel  coronary artery disease with preserved left ventricular function.  A full  consultation note has been dictated previously.   OPERATIVE CONSENT:  The patient has been counseled at length regarding the  indications, risks, and potential benefits of surgery.  Alternative  treatment strategies have been discussed.  He understands and accepts all  associated risks of surgery and desires to proceed as described.   OPERATIVE FINDINGS:  1.  Normal left ventricular function.  2.  Good-quality left internal mammary artery and right internal mammary      artery conduits for grafting.  3.  Good-quality conduit, although large-diameter saphenous vein.  4.  Good-quality distal target vessels with exception of the distal left      anterior descending coronary artery and the diagonal branch, both of      which were somewhat small and diffusely diseased.   OPERATIVE NOTE IN DETAIL:  The patient is brought to the operating room on  the above-mentioned date and central monitoring is established by the  anesthesia service under the care and direction of Dr. Judie Petit.  Specifically, a Swan-Ganz catheter is placed through the right internal  jugular approach.  A radial arterial line is placed.  Intravenous  antibiotics are administered.  Following induction with general endotracheal  anesthesia, a Foley catheter is placed.  The patient's chest, abdomen, both  groins, and both lower extremities are prepared and draped in a sterile  manner.   Baseline transesophageal echocardiogram is performed by Dr. Randa Evens.  This  demonstrates normal left ventricular function.  The aortic valve and mitral  valves appear normal.  No other significant abnormalities are noted.   A  median sternotomy incision is performed.  The left internal mammary artery  is dissected from the chest wall and prepared for bypass grafting.  Following this, the right internal mammary artery is also dissected from the  chest wall and prepared for bypass grafting.  Both internal mammary arteries  are good-quality conduits with excellent flow.  Simultaneously, saphenous  vein is obtained from the patient's right thigh using endoscopic vein  harvest technique through a small incision made just above the right knee.  The saphenous vein is notably slightly large caliber, but otherwise a good-  quality conduit.  After the saphenous vein is removed, the small surgical   incision in the right leg is closed in multiple layers with running  absorbable suture.   The patient is heparinized systemically.  The pericardium is opened.  The  ascending aorta is normal in appearance.  The ascending aorta and right  atrium are cannulated for cardiopulmonary bypass.  Adequate heparinization  is verified.  Cardiopulmonary bypass is begun and the surface of the heart  is inspected.  Distal sites are selected for coronary bypass grafting.  Portions of saphenous vein and both internal mammary arteries are trimmed to  appropriate lengths.  A temperature probe is placed in the left ventricular  septum.  A cardioplegic catheter is placed in the ascending aorta.   The patient is allowed to cool passively to 32 degrees systemic temperature.  The aortic crossclamp is applied and cold blood cardioplegia is delivered  initially in an antegrade fashion through the aortic root.  Iced saline  slush is applied for topical hypothermia.  The initial cardioplegic arrest  and myocardial cooling are felt to be excellent.  Repeat doses of  cardioplegia are administered intermittently throughout the crossclamp  portion of the operation through the aortic root and down the subsequently  placed vein grafts to maintain left ventricular septal temperature below 15  degrees centigrade.   The following distal coronary anastomoses were performed:  #1 - The  posterior descending coronary artery is grafted with a saphenous vein graft  in an end-to-side fashion.  This vessel measures 1.5 mm in diameter and is  of good quality at the site of distal bypass.  #2 - The diagonal branch off  the left anterior descending coronary artery is grafted with a saphenous  vein graft in an end-to-side fashion.  This vessel measures 1.2 mm at the  site of distal bypass and is diffusely diseased.  #3 - The first circumflex marginal branch is grafted with the right internal mammary artery in an end-  to-side  fashion.  The right internal mammary artery is utilized in situ with  its pedicle placed posterior to the aorta through the transverse sinus.  The  circumflex marginal branch measures 1.5 mm in diameter and is a good-quality  target.  #4 - The distal left anterior descending coronary artery is grafted  with the left internal mammary artery in an end-to-side fashion.  This  vessel measures 1.3 mm in diameter and is of fair quality.  It is diffusely  diseased.   Both proximal saphenous vein anastomoses are performed directly to the  ascending aorta prior to removal of the aortic crossclamp.  The internal  mammary arteries are reperfused and the septal temperature rises rapidly.  The aortic crossclamp was removed after a total crossclamp time of 73  minutes.  The heart is defibrillated and normal sinus rhythm resumes.  All  proximal and distal coronary anastomoses are inspected for hemostasis  and  appropriate graft orientation.  Epicardial pacing wires were affixed to the  right ventricular outflow tract and to the right atrial appendage.  The  patient is rewarmed to 37 degrees centigrade temperature.  The patient is  weaned from cardiopulmonary bypass without difficulty.  The patient's rhythm  at separation from bypass is normal sinus rhythm.  Atrial pacing is employed  to increase the heart rate.  No inotropic support is required.  The total  cardiopulmonary bypass time for the operation was 90 minutes.   Followup transesophageal echocardiogram performed by Dr. Randa Evens after  separation from bypass demonstrates normal left ventricular function.  No  other abnormalities are noted.   The venous and arterial cannulae are removed uneventfully.  Protamine is  administered to reverse the anticoagulation.  The mediastinum and both  pleural spaces are irrigated with saline solution containing vancomycin.  Meticulous surgical hemostasis is ascertained.  The mediastinum and both  pleural spaces  are drained using 4 chest tubes exited through separate stab  incision inferiorly.  The soft tissues anterior to the aorta and pericardium  are reapproximated loosely.   The On-Q continuous pain management system is utilized for postoperative  pain control.  Two 10-inch Silastic catheters supplied with the On-Q kit are  placed into the deep subcutaneous tissues located immediately anterior to  the costal cartilages just lateral to the lateral border of the sternum on  either side with the catheters oriented parallel to the orientation of the  sternotomy.  Each of these catheters are placed through separate stab  incisions along the costal margin.  Each catheter is flushed with 5 mL of  0.5% bupivacaine solution and ultimately they are connected to a continuous  infusion pump.   The sternum is reapproximated with double-strength sternal wire.  The soft tissues anterior to the sternum are closed in layers and the skin is closed  with a running subcuticular skin closure.   The patient tolerated the procedure well and is transported to the surgical  intensive care unit in stable condition.  There are no intraoperative  complications.  All sponge, instrument and needle counts are verified  correct at completion of the operation.  No blood products were  administered.      Salvatore Decent. Cornelius Moras, M.D.  Electronically Signed     CHO/MEDQ  D:  06/08/2005  T:  06/09/2005  Job:  [id number]   cc:   Olga Millers, M.D. [facility]  [address]  [address]  [place]  [place] [address]   Arvilla Meres, M.D. [facility]  [address]  [place]  [place] [address]   Vania Rea. Jarold Motto, M.D. [facility]  [address]  [place]  [place] [address]

## 2010-10-06 NOTE — Discharge Summary (Signed)
NAME:  Dylan Hudson, Dylan Hudson NO.:  [id number]   MEDICAL RECORD NO.:  [id number]          PATIENT TYPE:  INP   LOCATION:  2001                         FACILITY:  [facility]   PHYSICIAN:  Salvatore Decent. Cornelius Moras, M.D. DATE OF BIRTH:  June 03, 1960   DATE OF ADMISSION:  06/03/2005  DATE OF DISCHARGE:                                 DISCHARGE SUMMARY   ADMISSION DIAGNOSIS:  Chest pain.   DISCHARGE DIAGNOSES:  1.  Longstanding tobacco abuse.  2.  Hyperlipidemia.  3.  Hemoccult-positive stool discovered during this hospitalization.  4.  Status post benign colonic polyp excision in the past.  5.  Severe three-vessel coronary artery disease, status post acute      myocardial infarction, status post coronary artery bypass grafting x4.  6.  Acute blood loss anemia postoperatively, improving.   ALLERGIES:  NO KNOWN DRUG ALLERGIES.   HISTORY OF PRESENT ILLNESS:  The patient is a 50 year old Caucasian male  with no previous history of coronary artery disease but multiple risk  factors.  The patient states that he was in his usual state of health until  Friday, June 01, 2005 at which time he developed some burning substernal  chest pain following dinner.  This resolved uneventfully; however, the  following day, he had recurrence of similar pain that waxed and waned until  it became more severe at which time he presented to the emergency room at  [facility].  At the time of arrival, the patient's pain had resolved.  Resting EKG revealed a normal sinus rhythm with no acute ST segment changes  but signs suggestive of previous anterolateral myocardial infarction.  His  cardiac enzymes were positive, and the patient was subsequently admitted.   HOSPITAL COURSE:  The patient was admitted on June 03, 2005, as  previously stated, status post acute myocardial infarction.  He remained  medically stable and underwent cardiac catheterization by Dr. Gala Romney on  June 04, 2005.  He was found to  have severe three-vessel coronary artery  disease with mild left ventricular dysfunction.  Dr. Cornelius Moras of the CVTS  service was subsequently consulted regarding surgical revascularization.  Dr. Cornelius Moras evaluated the patient on June 04, 2005, and it was his opinion  that the patient should proceed with coronary artery bypass graft surgery.  The patient remained in stable condition until such time that he could be  taken to the OR.   The patient was taken to the OR on June 08, 2005 for coronary artery  bypass grafting x4.  Endoscopic vessel harvesting was performed on the right  lower extremity.  The saphenous vein was grafted to the PDA, saphenous vein  was grafted to the diagonal, the right internal mammary artery was grafted  to OM1, and the left internal mammary artery was grafted to the LAD.  The  patient tolerated the procedure well and was hemodynamically stable  immediately postoperatively.  The patient was transferred from the OR to the  SICU in stable condition.  The patient was extubated without complication  and woke up from anesthesia neurologically intact.   The patient's postoperative  course progressed as expected.  On postoperative  day #1, he was afebrile with stable vital signs and maintaining a normal  sinus rhythm.  All invasive lines and chest tubes were discontinued in a  routine manner, and he tolerated this well.  The patient has been volume-  overloaded postoperatively and has been diuresed accordingly.  He began  cardiac rehabilitation on postoperative day #1 and has continued to increase  his tolerance to a satisfactory level at this time.   On postoperative day #3, the patient is afebrile with stable vital signs,  again maintaining a normal sinus rhythm.   The patient does have a mild leukocytosis noted at 11.4.  He is  asymptomatic, with no signs or symptoms of infection.  He is in stable  condition at this time, and as long as he continues to progress in  the  current manner, he will be ready for discharge within the next one to two  days pending morning round reevaluation.   PHYSICAL EXAMINATION:  CARDIAC:  Regular rate and rhythm.  LUNGS:  Clear to auscultation with faint bibasilar crackles.  ABDOMEN:  Soft with decreased bowel sounds.  EXTREMITIES:  There is trace edema present in the bilateral lower  extremities.  The incisions are clean, dry, and intact.   LABORATORY DATA:  CBC on June 11, 2005 revealed a white count of 11.4,  hematocrit 9.6, hematocrit 26.8, platelets 83.  Sodium 137, potassium 3.5,  BUN 14, creatinine 1.1, glucose 101.   CONDITION ON DISCHARGE:  Improved.   DISCHARGE MEDICATIONS:  1.  Aspirin 325 mg daily.  2.  Toprol-XL 25 mg daily.  3.  Lipitor 80 mg q.h.s.  4.  Lasix 40 mg daily x5 days.  5.  K-Dur 20 mEq daily x5 days.  6.  Tylox one to two q.4-6h. p.r.n. pain.   ACTIVITY:  No driving, no lifting more than 10 pounds, and the patient  should continue daily breathing and walking exercises.   DIET:  Low salt, low fat.   WOUND CARE:  The patient may shower daily and clean the incisions with soap  and water.  If wound problems arise, the CVTS office should be contacted at  [phone].   FOLLOW UP:  1.  The patient has an appointment with Dr. Jens Som on June 22, 2005 at      11 a.m.  A chest x-ray will be taken      at this time which the patient will be instructed to bring to the      followup appointment with Dr. Cornelius Moras.  2.  The patient is to follow up with Dr. Cornelius Moras three weeks after discharge.      The CVTS office will contact the patient with the date and time of that      appointment.      Pecola Leisure, PA      Salvatore Decent. Cornelius Moras, M.D.  Electronically Signed    AY/MEDQ  D:  06/11/2005  T:  06/12/2005  Job:  [id number]   cc:   Salvatore Decent. Cornelius Moras, M.D.  [address]  [place]  [place] [address]   Olga Millers, M.D. [facility] [address]  [address]  [place]  [place] [address]

## 2010-11-29 ENCOUNTER — Other Ambulatory Visit: Payer: Self-pay | Admitting: Cardiovascular Disease

## 2010-12-25 ENCOUNTER — Other Ambulatory Visit: Payer: Self-pay | Admitting: Cardiology

## 2010-12-27 ENCOUNTER — Other Ambulatory Visit: Payer: Self-pay | Admitting: *Deleted

## 2010-12-27 MED ORDER — LISINOPRIL 10 MG PO TABS: 10.0000 mg | ORAL_TABLET | Freq: Every day | ORAL | Status: DC

## 2011-01-02 ENCOUNTER — Encounter: Payer: Self-pay | Admitting: Cardiology

## 2011-01-26 ENCOUNTER — Other Ambulatory Visit: Payer: Self-pay | Admitting: Cardiology

## 2011-01-29 ENCOUNTER — Encounter: Payer: Self-pay | Admitting: Internal Medicine

## 2011-01-29 ENCOUNTER — Ambulatory Visit: Payer: 59 | Admitting: Internal Medicine

## 2011-01-29 ENCOUNTER — Ambulatory Visit (INDEPENDENT_AMBULATORY_CARE_PROVIDER_SITE_OTHER): Payer: 59 | Admitting: Internal Medicine

## 2011-01-29 DIAGNOSIS — Z23 Encounter for immunization: Secondary | ICD-10-CM

## 2011-01-29 DIAGNOSIS — E785 Hyperlipidemia, unspecified: Secondary | ICD-10-CM

## 2011-01-29 DIAGNOSIS — Z86010 Personal history of colonic polyps: Secondary | ICD-10-CM

## 2011-01-29 DIAGNOSIS — I10 Essential (primary) hypertension: Secondary | ICD-10-CM

## 2011-01-29 DIAGNOSIS — K635 Polyp of colon: Secondary | ICD-10-CM

## 2011-01-29 DIAGNOSIS — Z79899 Other long term (current) drug therapy: Secondary | ICD-10-CM

## 2011-01-29 DIAGNOSIS — D126 Benign neoplasm of colon, unspecified: Secondary | ICD-10-CM

## 2011-02-02 ENCOUNTER — Other Ambulatory Visit (INDEPENDENT_AMBULATORY_CARE_PROVIDER_SITE_OTHER): Payer: 59

## 2011-02-02 ENCOUNTER — Telehealth: Payer: Self-pay | Admitting: *Deleted

## 2011-02-02 DIAGNOSIS — IMO0001 Reserved for inherently not codable concepts without codable children: Secondary | ICD-10-CM

## 2011-02-02 DIAGNOSIS — E785 Hyperlipidemia, unspecified: Secondary | ICD-10-CM

## 2011-02-02 DIAGNOSIS — I10 Essential (primary) hypertension: Secondary | ICD-10-CM

## 2011-02-02 DIAGNOSIS — Z79899 Other long term (current) drug therapy: Secondary | ICD-10-CM

## 2011-02-02 LAB — LIPID PANEL
HDL: 34.5 mg/dL — ABNORMAL LOW (ref >39.00)
Triglycerides: 136 mg/dL (ref 0.0–149.0)

## 2011-02-02 LAB — HEPATIC FUNCTION PANEL
ALT: 56 U/L — ABNORMAL HIGH (ref 0–53)
AST: 35 U/L (ref 0–37)
Albumin: 4.2 g/dL (ref 3.5–5.2)
Total Bilirubin: 2.4 mg/dL — ABNORMAL HIGH (ref 0.3–1.2)

## 2011-02-02 LAB — CBC WITH DIFFERENTIAL/PLATELET
Basophils Absolute: 0 10*3/uL (ref 0.0–0.1)
Eosinophils Relative: 4.1 % (ref 0.0–5.0)
MCV: 98.4 fl (ref 78.0–100.0)
Monocytes Absolute: 0.6 10*3/uL (ref 0.1–1.0)
Neutrophils Relative %: 50.8 % (ref 43.0–77.0)
Platelets: 146 10*3/uL — ABNORMAL LOW (ref 150.0–400.0)
RDW: 13.2 % (ref 11.5–14.6)
WBC: 4.4 10*3/uL — ABNORMAL LOW (ref 4.5–10.5)

## 2011-02-02 LAB — BASIC METABOLIC PANEL
Calcium: 9.2 mg/dL (ref 8.4–10.5)
GFR: 99.09 mL/min (ref >60.00)
Sodium: 138 mEq/L (ref 135–145)

## 2011-02-02 NOTE — Telephone Encounter (Signed)
With is fine.

## 2011-02-02 NOTE — Telephone Encounter (Signed)
Call placed to [facility] lab [phone], spoke with Chip Boer, she was informed CBCD was okay per Dr Rodena Medin. Lab order placed.

## 2011-02-02 NOTE — Telephone Encounter (Signed)
Received call from Vicky at [facility] stating pt had blood drawn today with order for a CBC. Do you want this with diff or without diff? If test is without diff, they will need to send it out to [place]. Please advise.

## 2011-02-04 NOTE — Assessment & Plan Note (Signed)
Normotensive and stable. Continue current regimen. Monitor bp as outpt and followup in clinic as scheduled. Obtain cbc and chem7 

## 2011-02-04 NOTE — Assessment & Plan Note (Signed)
Schedule for f/u colonoscopy

## 2011-02-04 NOTE — Assessment & Plan Note (Signed)
Obtain lipid/lft. 

## 2011-02-04 NOTE — Progress Notes (Signed)
  Subjective:    Patient ID: Dylan Hudson, male    DOB: 04-28-1961, 50 y.o.   MRN: [id number]  HPI  Pt presents to clinic for followup of multiple medical problems. H/o hyperlipidemia intolerant of zocor now taking zetia. Last colonoscopy 10/07 with recommended ?3y f/u. No blood in stool or change in bowel habits. bp reviewed as nl. H/o cad followed by cardiology and asx without cp. Quit tobcco 2007.  Past Medical History  Diagnosis Date  . CAD (coronary artery disease)     s/p CABG 01/07  . Gilbert's syndrome     possible. (elevated bilirubin)  . Benign positional vertigo   . Hx of adenomatous colonic polyps     Medoff  . Hyperlipidemia   . Hypertension   . Memory loss   . Hemochromatosis    Past Surgical History  Procedure Date  . Coronary artery bypass graft   . Median sternotomy     for CABG x 4 (left internal mammary artery to distal left anterior descending coronary artery, right internal mammary artery to first circumflex marginal branch, saphenous vein graft to posterior descending coronary artery, saphenous vein graft to diagonal branch, endoscopic saphenous vein harvest from right thigh.) SURGEON: Salvatore Decent. Cornelius Moras, M.D. CHO/MEDQ D:06/08/05. T: 06/09/2005 Job: [id number]  . Left knee arthroscopy     reports that he has quit smoking. His smoking use included Cigarettes. He has a 36 pack-year smoking history. He does not have any smokeless tobacco history on file. He reports that he drinks alcohol. His drug history not on file. family history includes Colon polyps in his mother.  There is no history of Colon cancer, and Breast cancer, and Prostate cancer, . Allergies  Allergen Reactions  . Simvastatin     REACTION: Myalgias     Review of Systems see hpi     Objective:   Physical Exam  Physical Exam  Nursing note and vitals reviewed. Constitutional: Appears well-developed and well-nourished. No distress.  HENT:  Head: Normocephalic and atraumatic.  Right Ear: External  ear normal.  Left Ear: External ear normal.  Eyes: Conjunctivae are normal. No scleral icterus.  Neck: Neck supple. Carotid bruit is not present.  Cardiovascular: Normal rate, regular rhythm and normal heart sounds.  Exam reveals no gallop and no friction rub.   No murmur heard. Pulmonary/Chest: Effort normal and breath sounds normal. No respiratory distress. He has no wheezes. no rales.  Lymphadenopathy:    He has no cervical adenopathy.  Neurological:Alert.  Skin: Skin is warm and dry. Not diaphoretic.  Psychiatric: Has a normal mood and affect.        Assessment & Plan:

## 2011-02-26 ENCOUNTER — Ambulatory Visit (AMBULATORY_SURGERY_CENTER): Payer: 59 | Admitting: *Deleted

## 2011-02-26 VITALS — Ht 72.0 in | Wt 217.0 lb

## 2011-02-26 DIAGNOSIS — Z1211 Encounter for screening for malignant neoplasm of colon: Secondary | ICD-10-CM

## 2011-02-26 MED ORDER — PEG-KCL-NACL-NASULF-NA ASC-C 100 G PO SOLR: ORAL | Status: DC

## 2011-02-27 ENCOUNTER — Encounter: Payer: Self-pay | Admitting: Internal Medicine

## 2011-02-28 ENCOUNTER — Other Ambulatory Visit: Payer: Self-pay | Admitting: *Deleted

## 2011-02-28 MED ORDER — METOPROLOL SUCCINATE ER 50 MG PO TB24: 50.0000 mg | ORAL_TABLET | Freq: Every day | ORAL | Status: DC

## 2011-02-28 NOTE — Telephone Encounter (Signed)
Received request from [facility] for pt's generic Toprol 50mg . 1 tablet daily. Gave verbal to [facility] for #30 x 2 refills.

## 2011-03-08 ENCOUNTER — Ambulatory Visit (AMBULATORY_SURGERY_CENTER): Payer: 59 | Admitting: Internal Medicine

## 2011-03-08 ENCOUNTER — Encounter: Payer: Self-pay | Admitting: Internal Medicine

## 2011-03-08 ENCOUNTER — Other Ambulatory Visit (INDEPENDENT_AMBULATORY_CARE_PROVIDER_SITE_OTHER): Payer: 59

## 2011-03-08 VITALS — BP 132/71 | HR 80 | Temp 98.2°F | Resp 18 | Ht 72.0 in | Wt 217.0 lb

## 2011-03-08 DIAGNOSIS — Z86010 Personal history of colonic polyps: Secondary | ICD-10-CM

## 2011-03-08 DIAGNOSIS — K5730 Diverticulosis of large intestine without perforation or abscess without bleeding: Secondary | ICD-10-CM

## 2011-03-08 DIAGNOSIS — D126 Benign neoplasm of colon, unspecified: Secondary | ICD-10-CM

## 2011-03-08 DIAGNOSIS — K648 Other hemorrhoids: Secondary | ICD-10-CM

## 2011-03-08 DIAGNOSIS — Z1211 Encounter for screening for malignant neoplasm of colon: Secondary | ICD-10-CM

## 2011-03-08 LAB — FERRITIN: Ferritin: 186.8 ng/mL (ref 22.0–322.0)

## 2011-03-08 MED ORDER — SODIUM CHLORIDE 0.9 % IV SOLN: 500.0000 mL | INTRAVENOUS | Status: DC

## 2011-03-08 NOTE — Patient Instructions (Addendum)
1) You will have labs checked today in the basement lab.  Ferritin (an iron test) will be performed and you will be called about results and plans. 2) The colonoscopy showed a very small polyp and it was removed. You also have diverticulosis and hemorrhoids. Please read the information provided. 3) Next routine colonoscopy 5-7 years - I will let you know. Iva Boop, MD, [facility] Discharge instructions given with verbal understanding. Handouts on polyps,diverticulosis,hemorrhoids and a high fiber diet given. Resume previous medications.

## 2011-03-08 NOTE — Assessment & Plan Note (Signed)
Has not had a ferritin a year and no phlebotomy either Recheck ferritin today

## 2011-03-08 NOTE — Progress Notes (Signed)
Quick Note:  He needs to have a phlebotomy again for hemochromatosis  Please look back into old notes - [facility] if needed He gets a slower - half phlebotomy in short stay due to vagal reactions when done in usual fashion. Please arrange for same method  We need to arrange for monthly phlebotomy x 3 and then a repeat ferritin 1 week after third phlebotomy ______

## 2011-03-09 ENCOUNTER — Other Ambulatory Visit: Payer: Self-pay

## 2011-03-09 ENCOUNTER — Telehealth: Payer: Self-pay | Admitting: *Deleted

## 2011-03-09 NOTE — Telephone Encounter (Signed)

## 2011-03-13 ENCOUNTER — Encounter: Payer: Self-pay | Admitting: Internal Medicine

## 2011-03-13 NOTE — Progress Notes (Signed)
Quick Note:  Diminutive adenoma Routine repeat in 7 years ______

## 2011-03-20 ENCOUNTER — Other Ambulatory Visit: Payer: Self-pay | Admitting: Internal Medicine

## 2011-03-20 ENCOUNTER — Ambulatory Visit (HOSPITAL_COMMUNITY)
Admission: RE | Admit: 2011-03-20 | Discharge: 2011-03-20 | Disposition: A | Discharge status: Home / Self Care | Payer: 59 | Source: Ambulatory Visit | Attending: Internal Medicine | Admitting: Internal Medicine

## 2011-03-20 LAB — HEMOGLOBIN AND HEMATOCRIT, BLOOD
HCT: 44.8 % (ref 39.0–52.0)
Hemoglobin: 16.2 g/dL (ref 13.0–17.0)

## 2011-03-20 NOTE — Progress Notes (Signed)
Quick Note:  Repeat phlebotomy again in 1 month 200 cc as before and then repeat again 1 month later and get ferritin 1 week after the phlebotomy ______

## 2011-04-11 ENCOUNTER — Other Ambulatory Visit (HOSPITAL_COMMUNITY): Payer: Self-pay | Admitting: *Deleted

## 2011-04-17 ENCOUNTER — Encounter (HOSPITAL_COMMUNITY)
Admission: RE | Admit: 2011-04-17 | Discharge: 2011-04-17 | Disposition: A | Discharge status: Home / Self Care | Payer: 59 | Source: Ambulatory Visit | Attending: Internal Medicine | Admitting: Internal Medicine

## 2011-04-17 NOTE — Progress Notes (Signed)
Quick Note:  To get a ferritin after next month's phlebotomy ______

## 2011-04-17 NOTE — Procedures (Signed)
200cc removed over . Pt tolerated well. Pt stayed after procedure. Stable at discharge.

## 2011-05-16 ENCOUNTER — Encounter (HOSPITAL_COMMUNITY): Payer: 59

## 2011-05-23 ENCOUNTER — Other Ambulatory Visit (HOSPITAL_COMMUNITY): Payer: Self-pay | Admitting: *Deleted

## 2011-05-25 ENCOUNTER — Ambulatory Visit (HOSPITAL_COMMUNITY)
Admission: RE | Admit: 2011-05-25 | Discharge: 2011-05-25 | Disposition: A | Discharge status: Home / Self Care | Payer: 59 | Source: Ambulatory Visit | Attending: Internal Medicine | Admitting: Internal Medicine

## 2011-05-25 ENCOUNTER — Encounter (HOSPITAL_COMMUNITY): Payer: Self-pay

## 2011-05-25 DIAGNOSIS — I10 Essential (primary) hypertension: Secondary | ICD-10-CM | POA: Insufficient documentation

## 2011-05-25 LAB — HEMOGLOBIN AND HEMATOCRIT, BLOOD: HCT: 42.7 % (ref 39.0–52.0)

## 2011-05-25 NOTE — Procedures (Signed)
Phlebotomized for 200cc whole blood. Pt tolerated well.

## 2011-05-29 ENCOUNTER — Other Ambulatory Visit: Payer: Self-pay

## 2011-05-29 NOTE — Progress Notes (Signed)
Quick Note:  Have him to repeat the phlebotomy for hemochromatosis in one month and please make sure they get a ferritin. ______

## 2011-05-31 ENCOUNTER — Other Ambulatory Visit: Payer: Self-pay | Admitting: Cardiology

## 2011-05-31 MED ORDER — EZETIMIBE 10 MG PO TABS: 10.0000 mg | ORAL_TABLET | Freq: Every day | ORAL | Status: DC

## 2011-06-04 ENCOUNTER — Telehealth: Payer: Self-pay | Admitting: Cardiology

## 2011-06-04 ENCOUNTER — Telehealth: Payer: Self-pay | Admitting: Internal Medicine

## 2011-06-04 MED ORDER — METOPROLOL SUCCINATE ER 50 MG PO TB24: 50.0000 mg | ORAL_TABLET | Freq: Every day | ORAL | Status: DC

## 2011-06-04 NOTE — Telephone Encounter (Signed)
Rx refill sent to pharmacy. 

## 2011-06-04 NOTE — Telephone Encounter (Signed)
New Problem:     Patient called in needing a refill of his metoprolol (TOPROL XL) 50 MG 24 hr tablet at the pharmacy listed on his profile.

## 2011-06-06 MED ORDER — METOPROLOL SUCCINATE ER 50 MG PO TB24: 50.0000 mg | ORAL_TABLET | Freq: Every day | ORAL | Status: DC

## 2011-06-22 ENCOUNTER — Encounter (HOSPITAL_COMMUNITY)
Admission: RE | Admit: 2011-06-22 | Discharge: 2011-06-22 | Disposition: A | Discharge status: Home / Self Care | Payer: 59 | Source: Ambulatory Visit | Attending: Internal Medicine | Admitting: Internal Medicine

## 2011-06-22 LAB — HEMOGLOBIN: Hemoglobin: 16.9 g/dL (ref 13.0–17.0)

## 2011-06-22 NOTE — Procedures (Signed)
Phlebotomized for 200cc from right antecubital. Tolerated well. No orthostasis.

## 2011-06-25 NOTE — Progress Notes (Signed)
Quick Note:  Continue with monthly phlebotomy with same protocol Repeat the ferritin in June 2013 ______

## 2011-06-26 NOTE — Progress Notes (Signed)
We need to discuss with lab - arrange the same protocol

## 2011-06-29 ENCOUNTER — Other Ambulatory Visit: Payer: Self-pay | Admitting: Cardiology

## 2011-07-25 ENCOUNTER — Ambulatory Visit (INDEPENDENT_AMBULATORY_CARE_PROVIDER_SITE_OTHER): Payer: 59 | Admitting: Internal Medicine

## 2011-07-25 ENCOUNTER — Encounter: Payer: Self-pay | Admitting: Internal Medicine

## 2011-07-25 DIAGNOSIS — E785 Hyperlipidemia, unspecified: Secondary | ICD-10-CM

## 2011-07-25 DIAGNOSIS — J310 Chronic rhinitis: Secondary | ICD-10-CM

## 2011-07-25 MED ORDER — FLUTICASONE PROPIONATE 50 MCG/ACT NA SUSP: 2.0000 | Freq: Every day | NASAL | Status: DC

## 2011-07-25 NOTE — Patient Instructions (Signed)
We will schedule your fasting labs at [facility]. Please have them done with about a week. Platelet-decreased plt, lft/lipid-272.4, chem7-v58.69. Also please schedule fasting labs prior to your next appointment-chem7-v58.69 and lipid/lft 272.4

## 2011-07-28 DIAGNOSIS — J310 Chronic rhinitis: Secondary | ICD-10-CM | POA: Insufficient documentation

## 2011-07-28 NOTE — Assessment & Plan Note (Signed)
Attempt flonase 

## 2011-07-28 NOTE — Progress Notes (Signed)
  Subjective:    Patient ID: Dylan Hudson, male    DOB: 10-20-1960, 51 y.o.   MRN: [id number]  HPI Pt presents to clinic for followup of multiple medical problems. H/o hyperlipidemia and CAD but has had multiple intolerances to statins. Cannot tolerate crestor, lipitor, zocor and livalo. ldl reviewed 114. No active complaint   Past Medical History  Diagnosis Date  . CAD (coronary artery disease)     s/p CABG 01/07  . Gilbert's syndrome     possible. (elevated bilirubin)  . Benign positional vertigo   . Hx of adenomatous colonic polyps     Medoff  . Hyperlipidemia   . Hypertension   . Memory loss   . Hemochromatosis   . Allergy     seasonal  . Myocardial infarction 05/2005   Past Surgical History  Procedure Date  . Coronary artery bypass graft   . Median sternotomy     for CABG x 4 (left internal mammary artery to distal left anterior descending coronary artery, right internal mammary artery to first circumflex marginal branch, saphenous vein graft to posterior descending coronary artery, saphenous vein graft to diagonal branch, endoscopic saphenous vein harvest from right thigh.) SURGEON: Salvatore Decent. Cornelius Moras, M.D. CHO/MEDQ D:06/08/05. T: 06/09/2005 Job: [id number]  . Left knee arthroscopy   . Colonoscopy 2003, 2007, 02/2011    small adenomas    reports that he quit smoking about 5 years ago. His smoking use included Cigarettes. He has a 36 pack-year smoking history. He has never used smokeless tobacco. He reports that he drinks about 2.4 ounces of alcohol per week. He reports that he does not use illicit drugs. family history includes Colon cancer in his paternal uncle and Colon polyps in his mother.  There is no history of Breast cancer and Prostate cancer. Allergies  Allergen Reactions  . Simvastatin     REACTION: Myalgias     Review of Systems see hpi     Objective:   Physical Exam  Physical Exam  Nursing note and vitals reviewed. Constitutional: Appears well-developed  and well-nourished. No distress.  HENT:  Head: Normocephalic and atraumatic.  Right Ear: External ear normal.  Left Ear: External ear normal.  Eyes: Conjunctivae are normal. No scleral icterus.  Neck: Neck supple. Carotid bruit is not present.  Cardiovascular: Normal rate, regular rhythm and normal heart sounds.  Exam reveals no gallop and no friction rub.   No murmur heard. Pulmonary/Chest: Effort normal and breath sounds normal. No respiratory distress. He has no wheezes. no rales.  Lymphadenopathy:    He has no cervical adenopathy.  Neurological:Alert.  Skin: Skin is warm and dry. Not diaphoretic.  Psychiatric: Has a normal mood and affect.        Assessment & Plan:

## 2011-07-28 NOTE — Assessment & Plan Note (Signed)
Multiple statin intolerances. Obtain lipid/lft. Currently maintained on zetia with h/o cad.

## 2011-07-31 ENCOUNTER — Other Ambulatory Visit: Payer: Self-pay | Admitting: Cardiology

## 2011-08-08 ENCOUNTER — Ambulatory Visit (INDEPENDENT_AMBULATORY_CARE_PROVIDER_SITE_OTHER): Payer: 59 | Admitting: Cardiology

## 2011-08-08 ENCOUNTER — Encounter: Payer: Self-pay | Admitting: Cardiology

## 2011-08-08 VITALS — BP 136/84 | HR 57 | Ht 72.0 in | Wt 223.0 lb

## 2011-08-08 DIAGNOSIS — I10 Essential (primary) hypertension: Secondary | ICD-10-CM

## 2011-08-08 DIAGNOSIS — R55 Syncope and collapse: Secondary | ICD-10-CM

## 2011-08-08 DIAGNOSIS — I2510 Atherosclerotic heart disease of native coronary artery without angina pectoris: Secondary | ICD-10-CM

## 2011-08-08 DIAGNOSIS — E785 Hyperlipidemia, unspecified: Secondary | ICD-10-CM

## 2011-08-08 NOTE — Assessment & Plan Note (Signed)
Blood pressure controlled. Continue present medications. 

## 2011-08-08 NOTE — Progress Notes (Signed)
HPI: [name] is a pleasant gentleman who has a history of coronary artery disease.  In 2007, the patient had cardiac catheterization that showed an ejection fraction of 45% with severe anterior apical hypokinesis and severe coronary disease.  He ultimately underwent coronary artery bypassing graft on June 04, 2005.  At that time, he had a saphenous vein graft to the PDA, status vein graft to the diagonal, a RIMA to the OM1, and a LIMA to the LAD. Echocardiogram in March of 2012 showed normal LV function, mild left atrial enlargement, mild tricuspid regurgitation and trace mitral regurgitation.  I last saw him in March of 2012. Since then the patient denies any dyspnea on exertion, orthopnea, PND, pedal edema, palpitations, syncope or chest pain.   Current Outpatient Prescriptions  Medication Sig Dispense Refill  . aspirin (RA ASPIRIN) 325 MG tablet Take 325 mg by mouth daily.        . fish oil-omega-3 fatty acids 1000 MG capsule Take 1 g by mouth 2 (two) times daily.        . fluticasone (FLONASE) 50 MCG/ACT nasal spray Place 2 sprays into the nose daily.  16 g  6  . lansoprazole (PREVACID) 15 MG capsule Take 15 mg by mouth every other day.      . lisinopril (PRINIVIL,ZESTRIL) 10 MG tablet Take 10 mg by mouth daily. 1/2 tablet daily      . loratadine (CLARITIN) 10 MG tablet Take 10 mg by mouth daily as needed.        . metoprolol succinate (TOPROL XL) 50 MG 24 hr tablet Take 1 tablet (50 mg total) by mouth daily.  30 tablet  3  . ZETIA 10 MG tablet TAKE 1 TABLET BY MOUTH EVERY DAY  30 tablet  0     Past Medical History  Diagnosis Date  . CAD (coronary artery disease)     s/p CABG 01/07  . Gilbert's syndrome     possible. (elevated bilirubin)  . Benign positional vertigo   . Hx of adenomatous colonic polyps     Medoff  . Hyperlipidemia   . Hypertension   . Memory loss   . Hemochromatosis   . Allergy     seasonal  . Myocardial infarction 05/2005    Past Surgical History    Procedure Date  . Coronary artery bypass graft   . Median sternotomy     for CABG x 4 (left internal mammary artery to distal left anterior descending coronary artery, right internal mammary artery to first circumflex marginal branch, saphenous vein graft to posterior descending coronary artery, saphenous vein graft to diagonal branch, endoscopic saphenous vein harvest from right thigh.) SURGEON: Dylan Hudson. Dylan Hudson, M.D. CHO/MEDQ D:06/08/05. T: 06/09/2005 Job: [id number]  . Left knee arthroscopy   . Colonoscopy 2003, 2007, 02/2011    small adenomas    History   Social History  . Marital Status: Divorced    Spouse Name: N/A    Number of Children: N/A  . Years of Education: N/A   Occupational History  . Service Games developer   Social History Main Topics  . Smoking status: Former Smoker -- 1.0 packs/day for 36 years    Types: Cigarettes    Quit date: 03/07/2006  . Smokeless tobacco: Never Used   Comment: smoked since age 57 approx. 1 pact to 1 and a half packs a day. fortunately after his bypass surgery he completely discontinued smoking  . Alcohol Use: 2.4  oz/week    4 Cans of beer per week     socially  . Drug Use: No  . Sexually Active: Not on file   Other Topics Concern  . Not on file   Social History Narrative   Lives alone. Fishing is a hobby.     ROS: no fevers or chills, productive cough, hemoptysis, dysphasia, odynophagia, melena, hematochezia, dysuria, hematuria, rash, seizure activity, orthopnea, PND, pedal edema, claudication. Remaining systems are negative.  Physical Exam: Well-developed well-nourished in no acute distress.  Skin is warm and dry.  HEENT is normal.  Neck is supple. No thyromegaly.  Chest is clear to auscultation with normal expansion.  Cardiovascular exam is regular rate and rhythm.  Abdominal exam nontender or distended. No masses palpated. Extremities show no edema. neuro grossly intact  ECG sinus bradycardia at a rate of  57. No ST changes.

## 2011-08-08 NOTE — Patient Instructions (Signed)
Your physician wants you to follow-up in: ONE YEAR You will receive a reminder letter in the mail two months in advance. If you don't receive a letter, please call our office to schedule the follow-up appointment.   Your physician has requested that you have en exercise stress myoview. For further information please visit [email or web address]. Please follow instruction sheet, as given.   

## 2011-08-08 NOTE — Assessment & Plan Note (Signed)
Continue aspirin. Intolerant to statins. Schedule Myoview for risk stratification. Grafts are now 53 years old.

## 2011-08-08 NOTE — Assessment & Plan Note (Signed)
No further episodes. Previous events felt to be vagally mediated. LV function normal.

## 2011-08-08 NOTE — Assessment & Plan Note (Signed)
Continue zetia. Intolerant to statins. Lipids and liver are monitored by primary care.

## 2011-08-15 ENCOUNTER — Ambulatory Visit (HOSPITAL_COMMUNITY): Payer: 59 | Attending: Cardiology | Admitting: Radiology

## 2011-08-15 VITALS — BP 128/85 | Ht 72.0 in | Wt 222.0 lb

## 2011-08-15 DIAGNOSIS — R0602 Shortness of breath: Secondary | ICD-10-CM

## 2011-08-15 DIAGNOSIS — R55 Syncope and collapse: Secondary | ICD-10-CM | POA: Insufficient documentation

## 2011-08-15 DIAGNOSIS — I10 Essential (primary) hypertension: Secondary | ICD-10-CM | POA: Insufficient documentation

## 2011-08-15 DIAGNOSIS — Z8249 Family history of ischemic heart disease and other diseases of the circulatory system: Secondary | ICD-10-CM | POA: Insufficient documentation

## 2011-08-15 DIAGNOSIS — E785 Hyperlipidemia, unspecified: Secondary | ICD-10-CM | POA: Insufficient documentation

## 2011-08-15 DIAGNOSIS — Z951 Presence of aortocoronary bypass graft: Secondary | ICD-10-CM | POA: Insufficient documentation

## 2011-08-15 DIAGNOSIS — Z87891 Personal history of nicotine dependence: Secondary | ICD-10-CM | POA: Insufficient documentation

## 2011-08-15 DIAGNOSIS — I2510 Atherosclerotic heart disease of native coronary artery without angina pectoris: Secondary | ICD-10-CM | POA: Insufficient documentation

## 2011-08-15 DIAGNOSIS — I252 Old myocardial infarction: Secondary | ICD-10-CM | POA: Insufficient documentation

## 2011-08-15 MED ORDER — TECHNETIUM TC 99M TETROFOSMIN IV KIT
10.0000 | PACK | Freq: Once | INTRAVENOUS | Status: AC | PRN
  Administered 2011-08-15: 10 via INTRAVENOUS

## 2011-08-15 MED ORDER — TECHNETIUM TC 99M TETROFOSMIN IV KIT
30.0000 | PACK | Freq: Once | INTRAVENOUS | Status: AC | PRN
  Administered 2011-08-15: 30 via INTRAVENOUS

## 2011-08-15 NOTE — Progress Notes (Signed)
[facility] SITE 3 NUCLEAR MED [address] [place] [address] [phone]  Cardiology Nuclear Med Study  Dylan Hudson is a 51 y.o. male     MRN : [id number]     DOB: 29-Aug-1960  Procedure Date: 08/15/2011  Nuclear Med Background Indication for Stress Test:  Evaluation for Ischemia and Graft Patency History:  2007, MI: NSTEMI, Heart Cath: multi vessel DZ  EF: 45% sent for CABG, 01/07 CABG x4, 3/12 ECHO: EF: 55-60% Cardiac Risk Factors: Family History - CAD, History of Smoking, Hypertension and Lipids  Symptoms:  Syncope   Nuclear Pre-Procedure Caffeine/Decaff Intake:  None NPO After: 7:00pm   Lungs:  clear O2 Sat: 97% on room air. IV 0.9% NS with Angio Cath:  20g  IV Site: R Hand  IV Started by:  Dylan Levan, RN  Chest Size (in):  44 Cup Size: n/a  Height: 6' (1.829 m)  Weight:  222 lb (100.699 kg)  BMI:  Body mass index is 30.11 kg/(m^2). Tech Comments:  Patient took all meds this AM per MD    Nuclear Med Study 1 or 2 day study: 1 day  Stress Test Type:  Stress  Reading MD: Dylan Ancona, MD  Order Authorizing Provider:  Olga Millers, MD  Resting Radionuclide: Technetium 23m Tetrofosmin  Resting Radionuclide Dose: 11.0 mCi   Stress Radionuclide:  Technetium 93m Tetrofosmin  Stress Radionuclide Dose: 33.0 mCi           Stress Protocol Rest HR: 65 Stress HR: 148  Rest BP: 128/85 Stress BP: 174/85  Exercise Time (min): 9:15 METS: 10.50   Predicted Max HR: 168 bpm % Max HR: 88.1 bpm Rate Pressure Product: 62952   Dose of Adenosine (mg):  n/a Dose of Lexiscan: n/a mg  Dose of Atropine (mg): n/a Dose of Dobutamine: n/a mcg/kg/min (at max HR)  Stress Test Technologist: Dylan Hudson, EMT-P  Nuclear Technologist:  Dylan Hudson, CNMT     Rest Procedure:  Myocardial perfusion imaging was performed at rest 45 minutes following the intravenous administration of Technetium 41m Tetrofosmin. Rest ECG: NSR - Normal EKG  Stress Procedure:  The  patient performed treadmill exercise using a Bruce  Protocol for 9:15 minutes. The patient stopped due to sob and denied any chest pain.  There were no significant ST-T wave changes and sob.  Technetium 72m Tetrofosmin was injected at peak exercise and myocardial perfusion imaging was performed after a brief delay. Stress ECG: No significant change from baseline ECG  QPS Raw Data Images:  Normal; no motion artifact; normal heart/lung ratio. Stress Images:  Small, moderate apical anterior perfusion defect.  Rest Images:  Small, moderate apical anterior perfusion defect.  Subtraction (SDS):  Fixed small, moderate apical anterior perfusion defect.  Transient Ischemic Dilatation (Normal <1.22):  0.83 Lung/Heart Ratio (Normal <0.45):  0.43  Quantitative Gated Spect Images QGS EDV:  83 ml QGS ESV:  27 ml  Impression Exercise Capacity:  Good exercise capacity. BP Response:  Normal blood pressure response. Clinical Symptoms:  Dyspnea, no chest pain.  ECG Impression:  No significant ST segment change suggestive of ischemia. Comparison with Prior Nuclear Study: No images to compare  Overall Impression:  Low risk stress nuclear study.  LV Ejection Fraction: 68%.  LV Wall Motion:  NL LV Function; NL Wall Motion.  Small, moderate intensity fixed apical anterior perfusion defect.  This may represent a small prior infarction with no ischemia.   Dylan Hudson [employer]

## 2011-08-26 ENCOUNTER — Other Ambulatory Visit: Payer: Self-pay | Admitting: Cardiology

## 2011-08-28 ENCOUNTER — Telehealth: Payer: Self-pay | Admitting: Internal Medicine

## 2011-08-28 NOTE — Telephone Encounter (Signed)
Patient has a history of hemochromatosis.  He has not been seen in several years or had phlebotomy for about 1 year.  He will come in on 09/18/11 to discuss

## 2011-09-18 ENCOUNTER — Encounter: Payer: Self-pay | Admitting: Internal Medicine

## 2011-09-18 ENCOUNTER — Ambulatory Visit (INDEPENDENT_AMBULATORY_CARE_PROVIDER_SITE_OTHER): Payer: 59 | Admitting: Internal Medicine

## 2011-09-18 DIAGNOSIS — K648 Other hemorrhoids: Secondary | ICD-10-CM

## 2011-09-18 DIAGNOSIS — Z86010 Personal history of colonic polyps: Secondary | ICD-10-CM

## 2011-09-18 MED ORDER — HYDROCORTISONE ACETATE 25 MG RE SUPP: 25.0000 mg | Freq: Every evening | RECTAL | Status: DC | PRN

## 2011-09-18 NOTE — Progress Notes (Signed)
  Subjective:    Patient ID: Dylan Shorter., male    DOB: Jan 21, 1961, 51 y.o.   MRN: [id number]  HPI This middle-aged white man returns to discuss his phlebotomy for hemochromatosis. He has also had some rectal bleeding. He has been diagnosed with hereditary hemochromatosis. He does been undergoing phlebotomy. When he initially started phlebotomy he had syncope, this occurred after several other phlebotomy sessions. He was doing a fairly frequently at that point. The very first time he did not eat before he had the blood drawn. Because of these problems that seemed to be vasovagal reactions,he was changed to half volume phlebotomy sessions and it was done in short stay at the hospital as per caution. This cost him $150 each session, and he would like to return to a more cost effective solution.he actually did go for a period of time without phlebotomy, and restarted in the fall of 2012, in October.  His rectal bleeding has been intermittent with wiping and on the stool and into the commode. Bright red blood. He has a known history of hemorrhoids. He says he is not straining his stool or spending extra time on the commode. He has not been weight lifting or doing any other heavy lifting. Stools are soft. No diarrhea. No rectal pain.he does relate a history of banding of hemorrhoids about 18 years ago by Dr. Johna Sheriff. Medications, allergies, past medical history, past surgical history, family history and social history are reviewed and updated in the EMR.   Review of Systems Recent cardiac stress test, routine. He said that was okay.    Objective:   Physical Exam Well-developed well-nourished no acute distress Rectal exam shows normal anoderm, there is no mass. Brown stool is present. Prostate is normal. Anoscopic examination shows inflamed swollen internal hemorrhoids.   Lab Results  Component Value Date   FERRITIN 178 06/22/2011       Assessment & Plan:   1. Hereditary hemochromatosis    His ferritin has climbed from 40 at its low to 178 in February. The goal will be to keep his less than 50. I will have to look into modifying his phlebotomy regimen again. He will need more frequent phlebotomy if he is going to do the half volume, or change this setting. I will try to help in changes setting to where he could do this monthly and not have the cost of $150. That could be back in our lab work perhaps he could get this done through the cancer center.  2. Hemorrhoids, internal, with bleeding     Anusol-HC suppositories to be used nightly for 7 nights and then as needed. If he has persistent problems with his hemorrhoids, will refer to surgery again for banding.

## 2011-09-18 NOTE — Patient Instructions (Addendum)
We have sent the following medications to your pharmacy for you to pick up at your convenience: Anusol  We have given you some information about hemorrhoids, as well as a handout regarding the iron rich foods you should try to avoid.  We will contact you within 2 weeks with a plan regarding your phlebotomy

## 2011-09-20 ENCOUNTER — Telehealth: Payer: Self-pay

## 2011-09-20 NOTE — Telephone Encounter (Signed)
Patient advised that the charge for the cancer center is $175.  I have ordered the 1/2 phlebotomy for the lab.  They are ok to let him come back since no problems since having the 1/2 phlebotomy.  He will come one day next week

## 2011-09-20 NOTE — Telephone Encounter (Signed)
Addended by: Annett Fabian on: 09/20/2011 03:09 PM   Modules accepted: Orders

## 2011-09-20 NOTE — Telephone Encounter (Signed)
Message copied by Annett Fabian on Thu Sep 20, 2011  2:04 PM ------      Message from: Iva Boop      Created: Tue Sep 18, 2011 12:55 PM      Regarding: phlebotomy cost at cancer center       He has to pay $150 at short stay for phlebotomy            He has had syncope twice so we moved him to short stay and did half phlebotomy            Please ask cancer center about charges for phlebotomy - will it be same as short stay -            Would like to refer him to hematologist and let them manage it with phlebotomy there if we can do it cheaper            If not may have to try in our lab again (if they will let us)

## 2011-09-20 NOTE — Telephone Encounter (Signed)
Patient aware of change to every 3 weeks.  Labs and phlebotomy reordered

## 2011-09-20 NOTE — Telephone Encounter (Signed)
Let's do it every 3 weeks x 6 and then check ferritin

## 2011-09-25 ENCOUNTER — Other Ambulatory Visit (INDEPENDENT_AMBULATORY_CARE_PROVIDER_SITE_OTHER): Payer: 59

## 2011-09-25 LAB — HEMOGLOBIN: Hemoglobin: 14.8 g/dL (ref 13.0–17.0)

## 2011-09-25 LAB — HEMATOCRIT: HCT: 42.8 % (ref 39.0–52.0)

## 2011-10-01 ENCOUNTER — Other Ambulatory Visit: Payer: Self-pay | Admitting: Cardiology

## 2011-10-18 ENCOUNTER — Other Ambulatory Visit (INDEPENDENT_AMBULATORY_CARE_PROVIDER_SITE_OTHER): Payer: 59

## 2011-10-19 NOTE — Progress Notes (Signed)
Quick Note:  Continue with planned phlebotomy ______ 

## 2011-11-02 ENCOUNTER — Other Ambulatory Visit: Payer: Self-pay | Admitting: Cardiology

## 2011-11-07 ENCOUNTER — Other Ambulatory Visit (INDEPENDENT_AMBULATORY_CARE_PROVIDER_SITE_OTHER): Payer: 59

## 2011-11-07 LAB — HEMOGLOBIN: Hemoglobin: 15.2 g/dL (ref 13.0–17.0)

## 2011-11-07 LAB — HEMATOCRIT: HCT: 43.9 % (ref 39.0–52.0)

## 2011-11-07 NOTE — Progress Notes (Signed)
Quick Note:  Continue with planned phlebotomy ______

## 2011-12-03 ENCOUNTER — Other Ambulatory Visit: Payer: Self-pay | Admitting: Cardiology

## 2011-12-13 ENCOUNTER — Other Ambulatory Visit (INDEPENDENT_AMBULATORY_CARE_PROVIDER_SITE_OTHER): Payer: 59

## 2011-12-13 LAB — HEMOGLOBIN: Hemoglobin: 14.8 g/dL (ref 13.0–17.0)

## 2011-12-14 NOTE — Progress Notes (Signed)
Quick Note:  Continue with planned phlebotomy He should have some more to do before we check ferritin ______

## 2012-01-02 ENCOUNTER — Other Ambulatory Visit: Payer: Self-pay | Admitting: Cardiology

## 2012-01-02 MED ORDER — LISINOPRIL 10 MG PO TABS: 5.0000 mg | ORAL_TABLET | Freq: Every day | ORAL | Status: DC

## 2012-01-03 ENCOUNTER — Other Ambulatory Visit: Payer: Self-pay | Admitting: Cardiology

## 2012-01-03 MED ORDER — LISINOPRIL 10 MG PO TABS: 5.0000 mg | ORAL_TABLET | Freq: Every day | ORAL | Status: DC

## 2012-01-08 ENCOUNTER — Other Ambulatory Visit (INDEPENDENT_AMBULATORY_CARE_PROVIDER_SITE_OTHER): Payer: 59

## 2012-01-12 NOTE — Progress Notes (Signed)
Quick Note:  Continue with current phlebotomy plan Let me know if this is last phlebotomy ordered ______

## 2012-02-01 ENCOUNTER — Telehealth: Payer: Self-pay | Admitting: Internal Medicine

## 2012-02-01 ENCOUNTER — Ambulatory Visit (INDEPENDENT_AMBULATORY_CARE_PROVIDER_SITE_OTHER): Payer: 59 | Admitting: Internal Medicine

## 2012-02-01 ENCOUNTER — Encounter: Payer: Self-pay | Admitting: Internal Medicine

## 2012-02-01 VITALS — BP 118/82 | HR 56 | Temp 97.8°F | Resp 16 | Wt 220.5 lb

## 2012-02-01 DIAGNOSIS — E785 Hyperlipidemia, unspecified: Secondary | ICD-10-CM

## 2012-02-01 DIAGNOSIS — I10 Essential (primary) hypertension: Secondary | ICD-10-CM

## 2012-02-01 DIAGNOSIS — Z23 Encounter for immunization: Secondary | ICD-10-CM

## 2012-02-01 MED ORDER — ATORVASTATIN CALCIUM 10 MG PO TABS: 10.0000 mg | ORAL_TABLET | Freq: Every day | ORAL | Status: DC

## 2012-02-01 NOTE — Telephone Encounter (Signed)
Please schedule fasting labs at [facility] in ~4 weeks    Cbc-401.9, chem7-v58.69, lipid/lft-272.4 and ck-v58.69  Also please schedule fasting labs prior to next visit lipid/lft-272.4 and ck-v58.69  Patient has upcoming appointment 07/02/12. He will be going to [facility] lab.

## 2012-02-01 NOTE — Patient Instructions (Addendum)
Please schedule fasting labs at [facility] in ~4 weeks  Cbc-401.9, chem7-v58.69, lipid/lft-272.4 and ck-v58.69 Also please schedule fasting labs prior to next visit lipid/lft-272.4 and ck-v58.69

## 2012-02-01 NOTE — Progress Notes (Signed)
  Subjective:    Patient ID: Dylan Shorter., male    DOB: 1960-12-23, 51 y.o.   MRN: [id number]  HPI Pt presents to clinic for followup of multiple medical problems.  History anemia and has had difficulty with good myalgias from multiple statins. Willing to reattempt low dose. Blood pressure reviewed as normotensive. Weight stable. Has no history of CAD followed by cardiology. Asymptomatic.  Past Medical History  Diagnosis Date  . CAD (coronary artery disease)     s/p CABG 01/07  . Gilbert's syndrome     possible. (elevated bilirubin)  . Benign positional vertigo   . Hx of adenomatous colonic polyps     Medoff  . Hyperlipidemia   . Hypertension   . Memory loss   . Hemochromatosis   . Allergy     seasonal  . Myocardial infarction 05/2005  . Internal hemorrhoid   . Diverticula of colon   . Tobacco abuse   . COPD (chronic obstructive pulmonary disease)   . Vertigo   . Amenia     postoperative   Past Surgical History  Procedure Date  . Coronary artery bypass graft   . Median sternotomy     for CABG x 4 (left internal mammary artery to distal left anterior descending coronary artery, right internal mammary artery to first circumflex marginal branch, saphenous vein graft to posterior descending coronary artery, saphenous vein graft to diagonal branch, endoscopic saphenous vein harvest from right thigh.) SURGEON: Salvatore Decent. Cornelius Moras, M.D. CHO/MEDQ D:06/08/05. T: 06/09/2005 Job: [id number]  . Left knee arthroscopy   . Colonoscopy 2003, 2007, 02/2011    small adenomas    reports that he quit smoking about 5 years ago. His smoking use included Cigarettes. He has a 36 pack-year smoking history. He has never used smokeless tobacco. He reports that he drinks about 2.4 ounces of alcohol per week. He reports that he does not use illicit drugs. family history includes Colon cancer in his paternal uncle and Colon polyps in his mother.  There is no history of Breast cancer and Prostate  cancer. Allergies  Allergen Reactions  . Simvastatin     REACTION: Myalgias      Review of Systems see hpi     Objective:   Physical Exam  Physical Exam  Nursing note and vitals reviewed. Constitutional: Appears well-developed and well-nourished. No distress.  HENT:  Head: Normocephalic and atraumatic.  Right Ear: External ear normal.  Left Ear: External ear normal.  Eyes: Conjunctivae are normal. No scleral icterus.  Neck: Neck supple. Carotid bruit is not present.  Cardiovascular: Normal rate, regular rhythm and normal heart sounds.  Exam reveals no gallop and no friction rub.   No murmur heard. Pulmonary/Chest: Effort normal and breath sounds normal. No respiratory distress. He has no wheezes. no rales.  Lymphadenopathy:    He has no cervical adenopathy.  Neurological:Alert.  Skin: Skin is warm and dry. Not diaphoretic.  Psychiatric: Has a normal mood and affect.        Assessment & Plan:

## 2012-02-05 ENCOUNTER — Other Ambulatory Visit: Payer: Self-pay

## 2012-02-05 NOTE — Telephone Encounter (Signed)
plz review request for Metoprolol med, pt said it was not received by Karin Golden

## 2012-02-06 ENCOUNTER — Other Ambulatory Visit (INDEPENDENT_AMBULATORY_CARE_PROVIDER_SITE_OTHER): Payer: 59

## 2012-02-06 LAB — HEMATOCRIT: HCT: 47 % (ref 39.0–52.0)

## 2012-02-06 LAB — HEMOGLOBIN: Hemoglobin: 15.5 g/dL (ref 13.0–17.0)

## 2012-02-06 LAB — FERRITIN: Ferritin: 21.4 ng/mL — ABNORMAL LOW (ref 22.0–322.0)

## 2012-02-07 ENCOUNTER — Other Ambulatory Visit: Payer: Self-pay | Admitting: *Deleted

## 2012-02-07 MED ORDER — METOPROLOL SUCCINATE ER 50 MG PO TB24: 50.0000 mg | ORAL_TABLET | Freq: Every day | ORAL | Status: DC

## 2012-02-07 NOTE — Telephone Encounter (Signed)
Refilled metoprolol and notified patient 

## 2012-02-08 NOTE — Assessment & Plan Note (Signed)
Normotensive and stable. Continue current regimen. Monitor bp as outpt and followup in clinic as scheduled.  

## 2012-02-08 NOTE — Assessment & Plan Note (Signed)
Begin Lipitor 10 mg a day. Obtain fasting liver profile, liver function test and CK enzyme at approximate four weeks

## 2012-02-10 NOTE — Progress Notes (Signed)
Quick Note:  Dylan Hudson,  Iron level is where we want it (ferritin < 50)  He needs to do 1/2 phlebotomy every month.  I will let you know when to do a ferritin  Cc: Dr. Rodena Medin (done)   ______

## 2012-02-11 ENCOUNTER — Other Ambulatory Visit: Payer: Self-pay

## 2012-03-03 ENCOUNTER — Emergency Department (HOSPITAL_COMMUNITY)
Admission: EM | Admit: 2012-03-03 | Discharge: 2012-03-03 | Disposition: A | Discharge status: Home / Self Care | Payer: 59 | Attending: Emergency Medicine | Admitting: Emergency Medicine

## 2012-03-03 ENCOUNTER — Other Ambulatory Visit (INDEPENDENT_AMBULATORY_CARE_PROVIDER_SITE_OTHER): Payer: 59

## 2012-03-03 ENCOUNTER — Encounter (HOSPITAL_COMMUNITY): Payer: Self-pay

## 2012-03-03 DIAGNOSIS — I2510 Atherosclerotic heart disease of native coronary artery without angina pectoris: Secondary | ICD-10-CM | POA: Insufficient documentation

## 2012-03-03 DIAGNOSIS — J4489 Other specified chronic obstructive pulmonary disease: Secondary | ICD-10-CM | POA: Insufficient documentation

## 2012-03-03 DIAGNOSIS — Z79899 Other long term (current) drug therapy: Secondary | ICD-10-CM | POA: Insufficient documentation

## 2012-03-03 DIAGNOSIS — E785 Hyperlipidemia, unspecified: Secondary | ICD-10-CM | POA: Insufficient documentation

## 2012-03-03 DIAGNOSIS — I252 Old myocardial infarction: Secondary | ICD-10-CM | POA: Insufficient documentation

## 2012-03-03 DIAGNOSIS — I10 Essential (primary) hypertension: Secondary | ICD-10-CM | POA: Insufficient documentation

## 2012-03-03 DIAGNOSIS — J449 Chronic obstructive pulmonary disease, unspecified: Secondary | ICD-10-CM | POA: Insufficient documentation

## 2012-03-03 DIAGNOSIS — Z951 Presence of aortocoronary bypass graft: Secondary | ICD-10-CM | POA: Insufficient documentation

## 2012-03-03 DIAGNOSIS — Z7982 Long term (current) use of aspirin: Secondary | ICD-10-CM | POA: Insufficient documentation

## 2012-03-03 DIAGNOSIS — R55 Syncope and collapse: Secondary | ICD-10-CM

## 2012-03-03 LAB — CBC WITH DIFFERENTIAL/PLATELET
Basophils Relative: 1 % (ref 0–1)
Eosinophils Absolute: 0.1 10*3/uL (ref 0.0–0.7)
Eosinophils Relative: 2 % (ref 0–5)
MCH: 32.5 pg (ref 26.0–34.0)
MCHC: 35.2 g/dL (ref 30.0–36.0)
MCV: 92.2 fL (ref 78.0–100.0)
Monocytes Relative: 12 % (ref 3–12)
Neutrophils Relative %: 73 % (ref 43–77)
Platelets: 181 10*3/uL (ref 150–400)

## 2012-03-03 LAB — COMPREHENSIVE METABOLIC PANEL
Albumin: 4.1 g/dL (ref 3.5–5.2)
Alkaline Phosphatase: 64 U/L (ref 39–117)
BUN: 13 mg/dL (ref 6–23)
Calcium: 9.5 mg/dL (ref 8.4–10.5)
GFR calc Af Amer: 89 mL/min — ABNORMAL LOW (ref >90)
Potassium: 3.6 mEq/L (ref 3.5–5.1)
Sodium: 137 mEq/L (ref 135–145)
Total Protein: 7.4 g/dL (ref 6.0–8.3)

## 2012-03-03 LAB — HEMOGLOBIN: Hemoglobin: 14.6 g/dL (ref 13.0–17.0)

## 2012-03-03 MED ORDER — SODIUM CHLORIDE 0.9 % IV SOLN
Freq: Once | INTRAVENOUS | Status: AC
  Administered 2012-03-03: 18:00:00 via INTRAVENOUS

## 2012-03-03 MED ORDER — ONDANSETRON HCL 4 MG/2ML IJ SOLN
4.0000 mg | Freq: Once | INTRAMUSCULAR | Status: AC
  Administered 2012-03-03: 4 mg via INTRAVENOUS

## 2012-03-03 MED ORDER — ONDANSETRON HCL 4 MG/2ML IJ SOLN
INTRAMUSCULAR | Status: AC
  Administered 2012-03-03: 4 mg via INTRAVENOUS
  Filled 2012-03-03: qty 2

## 2012-03-03 NOTE — ED Provider Notes (Signed)
I was present consultation the patient in question during their ED stay and agree with the resident's documentation and resident's medical plan.  Jones Skene, MD   Jones Skene, MD 03/03/12 2356

## 2012-03-03 NOTE — ED Provider Notes (Signed)
History     CSN: [id number]  Arrival date & time 03/03/12  1727   First MD Initiated Contact with Patient 03/03/12 1756      Chief Complaint  Patient presents with  . Loss of Consciousness    syncopal episode while having labs drawn at [facility] today; currently bradycardic at 54; however, pt reports this his usual heart rate     HPI   51 year old gentleman history of hemochromatosis, coronary artery disease status post CABG in 2007, and COPD presents with a fainting spell during therapeutic phlebotomy. He has a history of recurrent similar episodes in the past. He denies any chest pain, shortness of breath, palpitations prior to the fainting spell or currently. He states that towards the end of the therapeutic phlebotomy he began to feel lightheaded and informed the nurse at that point he felt generalized weakness and blurry vision. He denies any current headache, abdominal pain, vomiting, and denies any blood in his stools.    Past Medical History  Diagnosis Date  . CAD (coronary artery disease)     s/p CABG 01/07  . Gilbert's syndrome     possible. (elevated bilirubin)  . Benign positional vertigo   . Hx of adenomatous colonic polyps     Medoff  . Hyperlipidemia   . Hypertension   . Memory loss   . Hemochromatosis   . Allergy     seasonal  . Myocardial infarction 05/2005  . Internal hemorrhoid   . Diverticula of colon   . Tobacco abuse   . COPD (chronic obstructive pulmonary disease)   . Vertigo   . Amenia     postoperative    Past Surgical History  Procedure Date  . Coronary artery bypass graft   . Median sternotomy     for CABG x 4 (left internal mammary artery to distal left anterior descending coronary artery, right internal mammary artery to first circumflex marginal branch, saphenous vein graft to posterior descending coronary artery, saphenous vein graft to diagonal branch, endoscopic saphenous vein harvest from right thigh.) SURGEON: Salvatore Decent. Cornelius Moras, M.D.  CHO/MEDQ D:06/08/05. T: 06/09/2005 Job: [id number]  . Left knee arthroscopy   . Colonoscopy 2003, 2007, 02/2011    small adenomas    Family History  Problem Relation Age of Onset  . Breast cancer Neg Hx   . Prostate cancer Neg Hx   . Colon polyps Mother   . Colon cancer Paternal Uncle     History  Substance Use Topics  . Smoking status: Former Smoker -- 1.0 packs/day for 36 years    Types: Cigarettes    Quit date: 03/07/2006  . Smokeless tobacco: Never Used   Comment: smoked since age 38 approx. 1 pact to 1 and a half packs a day. fortunately after his bypass surgery he completely discontinued smoking  . Alcohol Use: 2.4 oz/week    4 Cans of beer per week     socially      Review of Systems  Constitutional: Negative for fever, activity change and appetite change.  HENT: Negative for ear pain, congestion, sore throat, rhinorrhea, neck pain, neck stiffness and sinus pressure.   Eyes: Negative for pain and redness.  Respiratory: Negative for cough, chest tightness and shortness of breath.   Cardiovascular: Negative for chest pain and palpitations.  Gastrointestinal: Negative for nausea, vomiting, abdominal pain, diarrhea and abdominal distention.  Genitourinary: Negative for dysuria, flank pain and difficulty urinating.  Musculoskeletal: Negative for back pain.  Skin: Negative for rash and  wound.  Neurological: Positive for syncope ( Near syncope). Negative for dizziness, light-headedness, numbness and headaches.  Hematological: Negative for adenopathy.  Psychiatric/Behavioral: Negative for behavioral problems, confusion and agitation.    Allergies  Simvastatin  Home Medications   Current Outpatient Rx  Name Route Sig Dispense Refill  . ASPIRIN 325 MG PO TABS Oral Take 325 mg by mouth daily.      . ATORVASTATIN CALCIUM 10 MG PO TABS Oral Take 1 tablet (10 mg total) by mouth daily. 30 tablet 6  . OMEGA-3 FATTY ACIDS 1000 MG PO CAPS Oral Take 1 g by mouth 2 (two) times  daily.      Marland Kitchen FLUTICASONE PROPIONATE 50 MCG/ACT NA SUSP Nasal Place 2 sprays into the nose daily.    Marland Kitchen HYDROCORTISONE ACETATE 25 MG RE SUPP Rectal Place 1 suppository (25 mg total) rectally at bedtime as needed for hemorrhoids (nightly x 7 nights then as needed for rectal bleeding). 24 suppository 0  . LANSOPRAZOLE 15 MG PO CPDR Oral Take 15 mg by mouth every other day.    Marland Kitchen LISINOPRIL 10 MG PO TABS Oral Take 5 mg by mouth daily.    Marland Kitchen LORATADINE 10 MG PO TABS Oral Take 10 mg by mouth daily as needed. For allergies    . METOPROLOL SUCCINATE ER 50 MG PO TB24 Oral Take 1 tablet (50 mg total) by mouth daily. 30 tablet 3    BP 135/87  Pulse 60  Temp 97.4 F (36.3 C) (Oral)  Resp 20  SpO2 100%  Physical Exam  Constitutional: He is oriented to person, place, and time. He appears well-developed and well-nourished. No distress.  HENT:  Head: Normocephalic and atraumatic.  Nose: Nose normal.  Mouth/Throat: Oropharynx is clear and moist.  Eyes: Conjunctivae normal and EOM are normal. Pupils are equal, round, and reactive to light.  Neck: Normal range of motion. Neck supple. No tracheal deviation present.  Cardiovascular: Normal rate, regular rhythm, normal heart sounds and intact distal pulses.        Midsternotomy scar from prior CABG  Pulmonary/Chest: Effort normal and breath sounds normal. No respiratory distress. He has no rales.  Abdominal: Soft. Bowel sounds are normal. He exhibits no distension. There is no tenderness. There is no rebound and no guarding.  Musculoskeletal: Normal range of motion. He exhibits no edema and no tenderness.  Neurological: He is alert and oriented to person, place, and time. He has normal strength. No cranial nerve deficit or sensory deficit. He displays a negative Romberg sign. GCS eye subscore is 4. GCS verbal subscore is 5. GCS motor subscore is 6.  Reflex Scores:      Patellar reflexes are 2+ on the right side and 2+ on the left side.      Achilles reflexes  are 2+ on the right side and 2+ on the left side. Skin: Skin is warm and dry.  Psychiatric: He has a normal mood and affect. His behavior is normal.    ED Course  Procedures (including critical care time)  Results for orders placed during the hospital encounter of 03/03/12  CBC WITH DIFFERENTIAL      Component Value Range   WBC 9.2  4.0 - 10.5 K/uL   RBC 4.77  4.22 - 5.81 MIL/uL   Hemoglobin 15.5  13.0 - 17.0 g/dL   HCT 57.8  46.9 - 62.9 %   MCV 92.2  78.0 - 100.0 fL   MCH 32.5  26.0 - 34.0 pg   MCHC  35.2  30.0 - 36.0 g/dL   RDW 40.9  81.1 - 91.4 %   Platelets 181  150 - 400 K/uL   Neutrophils Relative 73  43 - 77 %   Neutro Abs 6.7  1.7 - 7.7 K/uL   Lymphocytes Relative 13  12 - 46 %   Lymphs Abs 1.2  0.7 - 4.0 K/uL   Monocytes Relative 12  3 - 12 %   Monocytes Absolute 1.1 (*) 0.1 - 1.0 K/uL   Eosinophils Relative 2  0 - 5 %   Eosinophils Absolute 0.1  0.0 - 0.7 K/uL   Basophils Relative 1  0 - 1 %   Basophils Absolute 0.1  0.0 - 0.1 K/uL  COMPREHENSIVE METABOLIC PANEL      Component Value Range   Sodium 137  135 - 145 mEq/L   Potassium 3.6  3.5 - 5.1 mEq/L   Chloride 99  96 - 112 mEq/L   CO2 27  19 - 32 mEq/L   Glucose, Bld 125 (*) 70 - 99 mg/dL   BUN 13  6 - 23 mg/dL   Creatinine, Ser 7.82  0.50 - 1.35 mg/dL   Calcium 9.5  8.4 - 95.6 mg/dL   Total Protein 7.4  6.0 - 8.3 g/dL   Albumin 4.1  3.5 - 5.2 g/dL   AST 30  0 - 37 U/L   ALT 38  0 - 53 U/L   Alkaline Phosphatase 64  39 - 117 U/L   Total Bilirubin 1.8 (*) 0.3 - 1.2 mg/dL   GFR calc non Af Amer 77 (*) >90 mL/min   GFR calc Af Amer 89 (*) >90 mL/min  APTT      Component Value Range   aPTT 32  24 - 37 seconds  PROTIME-INR      Component Value Range   Prothrombin Time 13.4  11.6 - 15.2 seconds   INR 1.03  0.00 - 1.49     1. Primary idiopathic hemochromatosis   2. Near syncope       MDM   51 year old male in no acute distress, afebrile, vital signs stable, non toxic appearing who presents with  near syncope. Labwork reassuring. The patient states he feels much better after receiving some fluids. Patient tolerating by mouth fluids. Received a liter of normal saline. Patient ambulating well with no repeat episodes. Orthostatic vitals normal. Thorough discussion with the patient including return precautions, findings, and plan. He understands and agrees with plan. Will followup with primary care provider tomorrow.         Nadara Mustard, MD 03/03/12 2011

## 2012-03-03 NOTE — Progress Notes (Signed)
Agree with management. He will need to have future phlebotomy at the hospital, again,  I think. Hematology referral may be necessary.

## 2012-03-03 NOTE — ED Notes (Signed)
Pt actively vomiting.

## 2012-03-03 NOTE — Progress Notes (Signed)
Patient ID: Dylan Hudson., male   DOB: 1961/03/20, 51 y.o.   MRN: [id number] I was called by the lab personnel that the patient was there for a routine phlebotomy for hemochromatosis and that he was unresponsive after phlebotomy.  I found the patient in the lab in a reclined position diaphoretic, pale, and nauseated.  According to the lab personnel the patient became responsive after just a minute or so.  BP upon arrival 130/90, the lab reported that he was 133/91 prior to phlebotomy, Pulse 56.  Patient was drinking orange juice when I arrived also.  He had several episodes of vomiting over a 1 hour period, he was unable to tolerate sitting up. Repeated episodes of severe diaphoresis with any position changes.   Discussed with Dr. Leone Payor and EMS was contacted.  The patient was transported via ambulance to [facility].  A family friend and his employer were present.  Patient was alert and oriented upon transfer.

## 2012-03-04 ENCOUNTER — Other Ambulatory Visit: Payer: Self-pay

## 2012-03-04 ENCOUNTER — Telehealth: Payer: Self-pay | Admitting: Internal Medicine

## 2012-03-04 NOTE — Telephone Encounter (Signed)
Phlebotomy scheduled at [facility] short stay for monthly.  He is scheduled for 04/04/12 12:00.  He is aware of the appt date and time.  They will schedule the next visit with him at the time.

## 2012-03-04 NOTE — Telephone Encounter (Signed)
Patient aware, that I will set up phlebotomy at short stay for any further phlebotomy.  See documentation encounter from yesterday.

## 2012-03-05 ENCOUNTER — Telehealth: Payer: Self-pay | Admitting: Internal Medicine

## 2012-03-05 NOTE — Telephone Encounter (Signed)
Left message for patient to call back  

## 2012-03-05 NOTE — Telephone Encounter (Signed)
Patient had questions about phlebotomy.  All questions answered. He will call back for additional questions or concerns

## 2012-04-03 ENCOUNTER — Other Ambulatory Visit: Payer: Self-pay

## 2012-04-04 ENCOUNTER — Encounter (HOSPITAL_COMMUNITY): Admission: RE | Admit: 2012-04-04 | Payer: 59 | Source: Ambulatory Visit

## 2012-04-08 ENCOUNTER — Encounter (HOSPITAL_COMMUNITY)
Admission: RE | Admit: 2012-04-08 | Discharge: 2012-04-08 | Disposition: A | Discharge status: Home / Self Care | Payer: 59 | Source: Ambulatory Visit | Attending: Internal Medicine | Admitting: Internal Medicine

## 2012-04-08 ENCOUNTER — Encounter (HOSPITAL_COMMUNITY): Payer: Self-pay

## 2012-04-08 NOTE — Procedures (Signed)
No note

## 2012-04-08 NOTE — Progress Notes (Signed)
Tolerated 250gm phlebotomy well

## 2012-04-09 NOTE — Progress Notes (Signed)
Quick Note:  Continue with 1/2 phlebotomy every month ______

## 2012-05-06 ENCOUNTER — Encounter (HOSPITAL_COMMUNITY)
Admission: RE | Admit: 2012-05-06 | Discharge: 2012-05-06 | Disposition: A | Discharge status: Home / Self Care | Payer: 59 | Source: Ambulatory Visit | Attending: Internal Medicine | Admitting: Internal Medicine

## 2012-05-06 ENCOUNTER — Encounter (HOSPITAL_COMMUNITY): Payer: Self-pay

## 2012-05-06 LAB — HEMOGLOBIN AND HEMATOCRIT, BLOOD
HCT: 39.8 % (ref 39.0–52.0)
Hemoglobin: 13.9 g/dL (ref 13.0–17.0)

## 2012-05-06 NOTE — Procedures (Signed)
hgb 13.9 phlebotomy of 250 cc blood  over 11 minutes pt tolerated this well

## 2012-05-07 NOTE — Progress Notes (Signed)
Quick Note:  Continue with 1/2 phlebotomy each month ______

## 2012-05-21 HISTORY — PX: APPENDECTOMY: SHX54

## 2012-06-02 ENCOUNTER — Other Ambulatory Visit: Payer: Self-pay | Admitting: Cardiology

## 2012-06-04 ENCOUNTER — Encounter (HOSPITAL_COMMUNITY)
Admission: RE | Admit: 2012-06-04 | Discharge: 2012-06-04 | Disposition: A | Discharge status: Home / Self Care | Payer: 59 | Source: Ambulatory Visit | Attending: Internal Medicine | Admitting: Internal Medicine

## 2012-06-04 ENCOUNTER — Encounter (HOSPITAL_COMMUNITY): Payer: Self-pay

## 2012-06-04 LAB — HEMOGLOBIN AND HEMATOCRIT, BLOOD: Hemoglobin: 14.2 g/dL (ref 13.0–17.0)

## 2012-06-04 MED ORDER — SODIUM CHLORIDE 0.9 % IV SOLN
INTRAVENOUS | Status: DC
  Administered 2012-06-04: 15:00:00 via INTRAVENOUS

## 2012-06-04 NOTE — Procedures (Signed)
Phlebotomized 250cc from patient. Tolerated well. No adverse reactions noted.

## 2012-06-04 NOTE — Progress Notes (Signed)
Quick Note:  Have him wait 2 months for next 1/2 phlebotomy and get a ferritin then ______

## 2012-06-05 ENCOUNTER — Other Ambulatory Visit: Payer: Self-pay

## 2012-07-01 ENCOUNTER — Encounter (HOSPITAL_COMMUNITY): Payer: 59

## 2012-07-02 ENCOUNTER — Ambulatory Visit: Payer: 59 | Admitting: Internal Medicine

## 2012-07-23 ENCOUNTER — Ambulatory Visit: Payer: 59 | Admitting: Family

## 2012-07-23 ENCOUNTER — Ambulatory Visit: Payer: 59 | Admitting: Cardiology

## 2012-07-28 ENCOUNTER — Other Ambulatory Visit (HOSPITAL_COMMUNITY): Payer: Self-pay | Admitting: Internal Medicine

## 2012-07-28 ENCOUNTER — Other Ambulatory Visit: Payer: Self-pay

## 2012-07-28 NOTE — Progress Notes (Signed)
Orders placed from office for "1/2 phlebotomy". Called and spoke to Alesia Banda RN and told her we had to have a specific number to phlebotomize. She gave the order for 250cc. Will clarify in order section. She also stated he is to have a Ferritin lab drawn but the phlebotomy tomorrow is not dependent on this lab result.

## 2012-07-29 ENCOUNTER — Encounter (HOSPITAL_COMMUNITY)
Admission: RE | Admit: 2012-07-29 | Discharge: 2012-07-29 | Disposition: A | Discharge status: Home / Self Care | Payer: 59 | Source: Ambulatory Visit | Attending: Internal Medicine | Admitting: Internal Medicine

## 2012-08-06 ENCOUNTER — Ambulatory Visit (HOSPITAL_BASED_OUTPATIENT_CLINIC_OR_DEPARTMENT_OTHER)
Admission: RE | Admit: 2012-08-06 | Discharge: 2012-08-06 | Disposition: A | Discharge status: Home / Self Care | Payer: 59 | Source: Ambulatory Visit | Attending: Family | Admitting: Family

## 2012-08-06 ENCOUNTER — Emergency Department (HOSPITAL_COMMUNITY): Payer: 59 | Admitting: Anesthesiology

## 2012-08-06 ENCOUNTER — Encounter (HOSPITAL_COMMUNITY): Payer: Self-pay | Admitting: *Deleted

## 2012-08-06 ENCOUNTER — Observation Stay (HOSPITAL_COMMUNITY)
Admission: EM | Admit: 2012-08-06 | Discharge: 2012-08-07 | Disposition: A | Discharge status: Home / Self Care | Payer: 59 | Attending: General Surgery | Admitting: General Surgery

## 2012-08-06 ENCOUNTER — Encounter (HOSPITAL_COMMUNITY): Payer: Self-pay | Admitting: Anesthesiology

## 2012-08-06 ENCOUNTER — Encounter (HOSPITAL_COMMUNITY): Admission: RE | Admit: 2012-08-06 | Payer: 59 | Source: Ambulatory Visit

## 2012-08-06 ENCOUNTER — Ambulatory Visit (INDEPENDENT_AMBULATORY_CARE_PROVIDER_SITE_OTHER): Payer: 59 | Admitting: Family

## 2012-08-06 ENCOUNTER — Encounter: Payer: Self-pay | Admitting: Family

## 2012-08-06 ENCOUNTER — Encounter (HOSPITAL_COMMUNITY)
Admission: EM | Disposition: A | Discharge status: Home / Self Care | Payer: Self-pay | Source: Home / Self Care | Attending: Emergency Medicine

## 2012-08-06 ENCOUNTER — Telehealth: Payer: Self-pay | Admitting: Internal Medicine

## 2012-08-06 VITALS — BP 110/80 | HR 60 | Temp 98.9°F | Resp 16 | Ht 72.0 in | Wt 222.1 lb

## 2012-08-06 DIAGNOSIS — K37 Unspecified appendicitis: Secondary | ICD-10-CM | POA: Insufficient documentation

## 2012-08-06 DIAGNOSIS — K8020 Calculus of gallbladder without cholecystitis without obstruction: Secondary | ICD-10-CM | POA: Insufficient documentation

## 2012-08-06 DIAGNOSIS — R109 Unspecified abdominal pain: Secondary | ICD-10-CM

## 2012-08-06 DIAGNOSIS — J4489 Other specified chronic obstructive pulmonary disease: Secondary | ICD-10-CM | POA: Insufficient documentation

## 2012-08-06 DIAGNOSIS — J449 Chronic obstructive pulmonary disease, unspecified: Secondary | ICD-10-CM | POA: Insufficient documentation

## 2012-08-06 DIAGNOSIS — K3580 Unspecified acute appendicitis: Secondary | ICD-10-CM

## 2012-08-06 DIAGNOSIS — Z9049 Acquired absence of other specified parts of digestive tract: Secondary | ICD-10-CM | POA: Insufficient documentation

## 2012-08-06 DIAGNOSIS — E785 Hyperlipidemia, unspecified: Secondary | ICD-10-CM

## 2012-08-06 DIAGNOSIS — I10 Essential (primary) hypertension: Secondary | ICD-10-CM

## 2012-08-06 DIAGNOSIS — R110 Nausea: Secondary | ICD-10-CM

## 2012-08-06 DIAGNOSIS — K3533 Acute appendicitis with perforation and localized peritonitis, with abscess: Principal | ICD-10-CM | POA: Insufficient documentation

## 2012-08-06 HISTORY — PX: LAPAROSCOPIC APPENDECTOMY: SHX408

## 2012-08-06 LAB — CBC WITH DIFFERENTIAL/PLATELET
Basophils Absolute: 0 10*3/uL (ref 0.0–0.1)
Basophils Absolute: 0 10*3/uL (ref 0.0–0.1)
Basophils Relative: 0 % (ref 0–1)
Eosinophils Absolute: 0 10*3/uL (ref 0.0–0.7)
Eosinophils Relative: 0 % (ref 0–5)
Eosinophils Relative: 0 % (ref 0–5)
HCT: 42.1 % (ref 39.0–52.0)
Lymphocytes Relative: 12 % (ref 12–46)
Lymphs Abs: 1.3 10*3/uL (ref 0.7–4.0)
Lymphs Abs: 1.2 10*3/uL (ref 0.7–4.0)
MCH: 29.6 pg (ref 26.0–34.0)
MCHC: 34.1 g/dL (ref 30.0–36.0)
MCV: 82.7 fL (ref 78.0–100.0)
MCV: 86.7 fL (ref 78.0–100.0)
Monocytes Absolute: 1.4 10*3/uL — ABNORMAL HIGH (ref 0.1–1.0)
Neutro Abs: 8.1 10*3/uL — ABNORMAL HIGH (ref 1.7–7.7)
Neutrophils Relative %: 79 % — ABNORMAL HIGH (ref 43–77)
Platelets: 184 10*3/uL (ref 150–400)
Platelets: 163 10*3/uL (ref 150–400)
RBC: 5.09 MIL/uL (ref 4.22–5.81)
RBC: 4.9 MIL/uL (ref 4.22–5.81)
RDW: 14 % (ref 11.5–15.5)
RDW: 13.7 % (ref 11.5–15.5)
WBC: 10.7 10*3/uL — ABNORMAL HIGH (ref 4.0–10.5)

## 2012-08-06 LAB — POCT URINALYSIS DIPSTICK
Bilirubin, UA: NEGATIVE
Ketones, UA: NEGATIVE
Spec Grav, UA: 1.015

## 2012-08-06 LAB — URINALYSIS, ROUTINE W REFLEX MICROSCOPIC
Glucose, UA: NEGATIVE mg/dL
Ketones, ur: NEGATIVE mg/dL
Leukocytes, UA: NEGATIVE
Protein, ur: NEGATIVE mg/dL
Urobilinogen, UA: 4 mg/dL — ABNORMAL HIGH (ref 0.0–1.0)

## 2012-08-06 LAB — HEPATIC FUNCTION PANEL
AST: 21 U/L (ref 0–37)
Albumin: 4.5 g/dL (ref 3.5–5.2)
Bilirubin, Direct: 0.6 mg/dL — ABNORMAL HIGH (ref 0.0–0.3)
Total Bilirubin: 2.2 mg/dL — ABNORMAL HIGH (ref 0.3–1.2)

## 2012-08-06 LAB — COMPREHENSIVE METABOLIC PANEL
AST: 22 U/L (ref 0–37)
Albumin: 3.9 g/dL (ref 3.5–5.2)
Alkaline Phosphatase: 52 U/L (ref 39–117)
CO2: 27 mEq/L (ref 19–32)
Chloride: 100 mEq/L (ref 96–112)
Creatinine, Ser: 1.1 mg/dL (ref 0.50–1.35)
GFR calc non Af Amer: 75 mL/min — ABNORMAL LOW (ref >90)
Potassium: 3.7 mEq/L (ref 3.5–5.1)
Total Bilirubin: 2.1 mg/dL — ABNORMAL HIGH (ref 0.3–1.2)

## 2012-08-06 LAB — BASIC METABOLIC PANEL
Chloride: 100 mEq/L (ref 96–112)
Potassium: 4.1 mEq/L (ref 3.5–5.3)
Sodium: 138 mEq/L (ref 135–145)

## 2012-08-06 LAB — LIPASE, BLOOD: Lipase: 18 U/L (ref 11–59)

## 2012-08-06 SURGERY — APPENDECTOMY, LAPAROSCOPIC
Anesthesia: General | Site: Abdomen | Wound class: Clean Contaminated

## 2012-08-06 MED ORDER — SODIUM CHLORIDE 0.9 % IR SOLN
Status: DC | PRN
  Administered 2012-08-06: 1000 mL

## 2012-08-06 MED ORDER — FLUTICASONE PROPIONATE 50 MCG/ACT NA SUSP
2.0000 | Freq: Every day | NASAL | Status: DC
  Filled 2012-08-06: qty 16

## 2012-08-06 MED ORDER — ONDANSETRON HCL 4 MG/2ML IJ SOLN: 4.0000 mg | Freq: Once | INTRAMUSCULAR | Status: DC | PRN

## 2012-08-06 MED ORDER — NEOSTIGMINE METHYLSULFATE 1 MG/ML IJ SOLN
INTRAMUSCULAR | Status: DC | PRN
  Administered 2012-08-06: 3 mg via INTRAVENOUS

## 2012-08-06 MED ORDER — SODIUM CHLORIDE 0.9 % IV SOLN: INTRAVENOUS | Status: DC | PRN

## 2012-08-06 MED ORDER — METOPROLOL SUCCINATE ER 50 MG PO TB24
50.0000 mg | ORAL_TABLET | Freq: Every day | ORAL | Status: DC
  Administered 2012-08-07: 50 mg via ORAL
  Filled 2012-08-06 (×2): qty 1

## 2012-08-06 MED ORDER — ROCURONIUM BROMIDE 100 MG/10ML IV SOLN
INTRAVENOUS | Status: DC | PRN
  Administered 2012-08-06: 30 mg via INTRAVENOUS

## 2012-08-06 MED ORDER — LACTATED RINGERS IV SOLN
INTRAVENOUS | Status: DC | PRN
  Administered 2012-08-06: 22:00:00 via INTRAVENOUS

## 2012-08-06 MED ORDER — DEXAMETHASONE SODIUM PHOSPHATE 4 MG/ML IJ SOLN
INTRAMUSCULAR | Status: DC | PRN
Start: 2012-08-06 — End: 2012-08-06
  Administered 2012-08-06: 4 mg via INTRAVENOUS

## 2012-08-06 MED ORDER — ONDANSETRON HCL 4 MG/2ML IJ SOLN: 4.0000 mg | Freq: Four times a day (QID) | INTRAMUSCULAR | Status: DC | PRN

## 2012-08-06 MED ORDER — GLYCOPYRROLATE 0.2 MG/ML IJ SOLN
INTRAMUSCULAR | Status: DC | PRN
  Administered 2012-08-06: 0.2 mg via INTRAVENOUS
  Administered 2012-08-06: 0.4 mg via INTRAVENOUS

## 2012-08-06 MED ORDER — BUPIVACAINE-EPINEPHRINE 0.25% -1:200000 IJ SOLN
INTRAMUSCULAR | Status: DC | PRN
  Administered 2012-08-06: 8 mL

## 2012-08-06 MED ORDER — MIDAZOLAM HCL 5 MG/5ML IJ SOLN
INTRAMUSCULAR | Status: DC | PRN
  Administered 2012-08-06: 2 mg via INTRAVENOUS

## 2012-08-06 MED ORDER — LIDOCAINE HCL (CARDIAC) 20 MG/ML IV SOLN
INTRAVENOUS | Status: DC | PRN
  Administered 2012-08-06: 100 mg via INTRAVENOUS

## 2012-08-06 MED ORDER — SUFENTANIL CITRATE 50 MCG/ML IV SOLN
INTRAVENOUS | Status: DC | PRN
  Administered 2012-08-06: 20 ug via INTRAVENOUS

## 2012-08-06 MED ORDER — LIDOCAINE HCL 4 % MT SOLN
OROMUCOSAL | Status: DC | PRN
  Administered 2012-08-06: 4 mL via TOPICAL

## 2012-08-06 MED ORDER — KCL IN DEXTROSE-NACL 20-5-0.45 MEQ/L-%-% IV SOLN
INTRAVENOUS | Status: DC
  Administered 2012-08-07: 01:00:00 via INTRAVENOUS
  Filled 2012-08-06 (×2): qty 1000

## 2012-08-06 MED ORDER — HYDROMORPHONE HCL PF 1 MG/ML IJ SOLN: 0.2500 mg | INTRAMUSCULAR | Status: DC | PRN

## 2012-08-06 MED ORDER — PROPOFOL 10 MG/ML IV BOLUS
INTRAVENOUS | Status: DC | PRN
  Administered 2012-08-06: 200 mg via INTRAVENOUS

## 2012-08-06 MED ORDER — MORPHINE SULFATE 4 MG/ML IJ SOLN: 4.0000 mg | INTRAMUSCULAR | Status: DC | PRN

## 2012-08-06 MED ORDER — KETOROLAC TROMETHAMINE 30 MG/ML IJ SOLN
30.0000 mg | Freq: Four times a day (QID) | INTRAMUSCULAR | Status: DC
  Administered 2012-08-07: 30 mg via INTRAVENOUS
  Filled 2012-08-06 (×5): qty 1

## 2012-08-06 MED ORDER — ONDANSETRON HCL 4 MG/2ML IJ SOLN
INTRAMUSCULAR | Status: DC | PRN
  Administered 2012-08-06: 4 mg via INTRAVENOUS

## 2012-08-06 MED ORDER — SODIUM CHLORIDE 0.9 % IV SOLN
3.0000 g | Freq: Four times a day (QID) | INTRAVENOUS | Status: AC
  Administered 2012-08-07: 3 g via INTRAVENOUS
  Filled 2012-08-06: qty 3

## 2012-08-06 MED ORDER — SODIUM CHLORIDE 0.9 % IV SOLN
INTRAVENOUS | Status: DC
  Administered 2012-08-06: 100 mL/h via INTRAVENOUS
  Administered 2012-08-06: 22:00:00 via INTRAVENOUS

## 2012-08-06 MED ORDER — ACETAMINOPHEN 325 MG PO TABS: 650.0000 mg | ORAL_TABLET | ORAL | Status: DC | PRN

## 2012-08-06 MED ORDER — SODIUM CHLORIDE 0.9 % IV SOLN
3.0000 g | Freq: Four times a day (QID) | INTRAVENOUS | Status: DC
  Administered 2012-08-06: 3 g via INTRAVENOUS
  Filled 2012-08-06 (×3): qty 3

## 2012-08-06 MED ORDER — EPHEDRINE SULFATE 50 MG/ML IJ SOLN
INTRAMUSCULAR | Status: DC | PRN
  Administered 2012-08-06: 10 mg via INTRAVENOUS

## 2012-08-06 MED ORDER — ONDANSETRON HCL 4 MG PO TABS: 4.0000 mg | ORAL_TABLET | Freq: Four times a day (QID) | ORAL | Status: DC | PRN

## 2012-08-06 MED ORDER — MORPHINE SULFATE 4 MG/ML IJ SOLN
4.0000 mg | Freq: Once | INTRAMUSCULAR | Status: AC
  Administered 2012-08-06: 4 mg via INTRAVENOUS
  Filled 2012-08-06: qty 1

## 2012-08-06 MED ORDER — LISINOPRIL 5 MG PO TABS
5.0000 mg | ORAL_TABLET | Freq: Every day | ORAL | Status: DC
  Administered 2012-08-07: 5 mg via ORAL
  Filled 2012-08-06: qty 1

## 2012-08-06 MED ORDER — OXYCODONE-ACETAMINOPHEN 5-325 MG PO TABS: 1.0000 | ORAL_TABLET | ORAL | Status: DC | PRN

## 2012-08-06 SURGICAL SUPPLY — 41 items
APPLIER CLIP ROT 10 11.4 M/L (STAPLE)
BENZOIN TINCTURE PRP APPL 2/3 (GAUZE/BANDAGES/DRESSINGS) ×2 IMPLANT
BLADE SURG ROTATE 9660 (MISCELLANEOUS) ×2 IMPLANT
CANISTER SUCTION 2500CC (MISCELLANEOUS) ×2 IMPLANT
CHLORAPREP W/TINT 26ML (MISCELLANEOUS) ×2 IMPLANT
CLIP APPLIE ROT 10 11.4 M/L (STAPLE) IMPLANT
CLOTH BEACON ORANGE TIMEOUT ST (SAFETY) ×2 IMPLANT
CLSR STERI-STRIP ANTIMIC 1/2X4 (GAUZE/BANDAGES/DRESSINGS) ×2 IMPLANT
COVER SURGICAL LIGHT HANDLE (MISCELLANEOUS) ×2 IMPLANT
CUTTER FLEX LINEAR 45M (STAPLE) ×2 IMPLANT
DECANTER SPIKE VIAL GLASS SM (MISCELLANEOUS) ×2 IMPLANT
DRAPE UTILITY 15X26 W/TAPE STR (DRAPE) ×4 IMPLANT
DRSG TEGADERM 4X4.75 (GAUZE/BANDAGES/DRESSINGS) ×2 IMPLANT
ELECT REM PT RETURN 9FT ADLT (ELECTROSURGICAL) ×2
ELECTRODE REM PT RTRN 9FT ADLT (ELECTROSURGICAL) ×1 IMPLANT
ENDOLOOP SUT PDS II  0 18 (SUTURE)
ENDOLOOP SUT PDS II 0 18 (SUTURE) IMPLANT
FILTER SMOKE EVAC LAPAROSHD (FILTER) ×2 IMPLANT
GAUZE SPONGE 2X2 8PLY STRL LF (GAUZE/BANDAGES/DRESSINGS) ×1 IMPLANT
GLOVE BIO SURGEON STRL SZ7 (GLOVE) ×2 IMPLANT
GLOVE BIOGEL PI IND STRL 7.5 (GLOVE) ×1 IMPLANT
GLOVE BIOGEL PI INDICATOR 7.5 (GLOVE) ×1
GOWN STRL NON-REIN LRG LVL3 (GOWN DISPOSABLE) ×6 IMPLANT
KIT BASIN OR (CUSTOM PROCEDURE TRAY) ×2 IMPLANT
KIT ROOM TURNOVER OR (KITS) ×2 IMPLANT
NS IRRIG 1000ML POUR BTL (IV SOLUTION) IMPLANT
PAD ARMBOARD 7.5X6 YLW CONV (MISCELLANEOUS) ×4 IMPLANT
POUCH SPECIMEN RETRIEVAL 10MM (ENDOMECHANICALS) ×2 IMPLANT
RELOAD STAPLE TA45 3.5 REG BLU (ENDOMECHANICALS) ×2 IMPLANT
SCALPEL HARMONIC ACE (MISCELLANEOUS) ×2 IMPLANT
SET IRRIG TUBING LAPAROSCOPIC (IRRIGATION / IRRIGATOR) ×2 IMPLANT
SPECIMEN JAR SMALL (MISCELLANEOUS) ×2 IMPLANT
SPONGE GAUZE 2X2 STER 10/PKG (GAUZE/BANDAGES/DRESSINGS) ×1
SUT MNCRL AB 4-0 PS2 18 (SUTURE) ×2 IMPLANT
TOWEL OR 17X24 6PK STRL BLUE (TOWEL DISPOSABLE) ×2 IMPLANT
TOWEL OR 17X26 10 PK STRL BLUE (TOWEL DISPOSABLE) ×2 IMPLANT
TRAY FOLEY CATH 14FR (SET/KITS/TRAYS/PACK) ×2 IMPLANT
TRAY LAPAROSCOPIC (CUSTOM PROCEDURE TRAY) ×2 IMPLANT
TROCAR XCEL BLADELESS 5X75MML (TROCAR) ×4 IMPLANT
TROCAR XCEL BLUNT TIP 100MML (ENDOMECHANICALS) ×2 IMPLANT
WATER STERILE IRR 1000ML POUR (IV SOLUTION) IMPLANT

## 2012-08-06 NOTE — ED Provider Notes (Signed)
History     CSN: [id number]  Arrival date & time 08/06/12  1524   Chief Complaint  Patient presents with  . Abdominal Pain   HPI  52 y/o who presents with cc of abdominal pain. Symptoms began yesterday as periumbilical epigastric "burning" after eating a tangerine. Today the pain has migrated to his RLQ. He has had associated chills. He states that currently his pain is a 3/10. He states palpation makes the pain worse. He was seen at urgent care earlier today where a CT scan revealed acute appendicitis. The patient was sent here for further evaluation.   Past Medical History  Diagnosis Date  . CAD (coronary artery disease)     s/p CABG 01/07  . Gilbert's syndrome     possible. (elevated bilirubin)  . Benign positional vertigo   . Hx of adenomatous colonic polyps     Medoff  . Hyperlipidemia   . Hypertension   . Memory loss   . Hemochromatosis   . Allergy     seasonal  . Myocardial infarction 05/2005  . Internal hemorrhoid   . Diverticula of colon   . Tobacco abuse   . COPD (chronic obstructive pulmonary disease)   . Vertigo   . Amenia     postoperative    Past Surgical History  Procedure Laterality Date  . Coronary artery bypass graft    . Median sternotomy      for CABG x 4 (left internal mammary artery to distal left anterior descending coronary artery, right internal mammary artery to first circumflex marginal branch, saphenous vein graft to posterior descending coronary artery, saphenous vein graft to diagonal branch, endoscopic saphenous vein harvest from right thigh.) SURGEON: Salvatore Decent. Cornelius Moras, M.D. CHO/MEDQ D:06/08/05. T: 06/09/2005 Job: [id number]  . Left knee arthroscopy    . Colonoscopy  2003, 2007, 02/2011    small adenomas    Family History  Problem Relation Age of Onset  . Breast cancer Neg Hx   . Prostate cancer Neg Hx   . Colon polyps Mother   . Colon cancer Paternal Uncle     History  Substance Use Topics  . Smoking status: Former Smoker -- 1.00  packs/day for 36 years    Types: Cigarettes    Quit date: 03/07/2006  . Smokeless tobacco: Never Used     Comment: smoked since age 2 approx. 1 pact to 1 and a half packs a day. fortunately after his bypass surgery he completely discontinued smoking  . Alcohol Use: 2.4 oz/week    4 Cans of beer per week     Comment: socially    Review of Systems  Constitutional: Positive for chills. Negative for fever.  Respiratory: Negative for cough and shortness of breath.   Cardiovascular: Negative for chest pain.  Gastrointestinal: Positive for abdominal pain and diarrhea. Negative for nausea and vomiting.  All other systems reviewed and are negative.   Allergies  Simvastatin  Home Medications   Current Outpatient Rx  Name  Route  Sig  Dispense  Refill  . aspirin 325 MG tablet   Oral   Take 325 mg by mouth daily.         Marland Kitchen atorvastatin (LIPITOR) 10 MG tablet   Oral   Take 10 mg by mouth every 3 (three) days.          . fish oil-omega-3 fatty acids 1000 MG capsule   Oral   Take 1 g by mouth 2 (two) times daily.           Marland Kitchen  fluticasone (FLONASE) 50 MCG/ACT nasal spray   Nasal   Place 2 sprays into the nose daily.         . lansoprazole (PREVACID) 15 MG capsule   Oral   Take 15 mg by mouth every other day.         . lisinopril (PRINIVIL,ZESTRIL) 10 MG tablet   Oral   Take 5 mg by mouth daily.         Marland Kitchen loratadine (CLARITIN) 10 MG tablet   Oral   Take 10 mg by mouth daily as needed. For allergies         . metoprolol succinate (TOPROL-XL) 50 MG 24 hr tablet   Oral   Take 50 mg by mouth daily. Take with or immediately following a meal.           BP 108/69  Pulse 63  Temp(Src) 98.4 F (36.9 C) (Oral)  Resp 18  SpO2 99%  Physical Exam  Nursing note and vitals reviewed. Constitutional: He is oriented to person, place, and time. He appears well-developed and well-nourished. No distress.  HENT:  Head: Normocephalic and atraumatic.  Mouth/Throat: No  oropharyngeal exudate.  Eyes: Conjunctivae are normal. Pupils are equal, round, and reactive to light.  Neck: Normal range of motion. Neck supple.  Cardiovascular: Normal rate and regular rhythm.  Exam reveals no gallop and no friction rub.   No murmur heard. Pulmonary/Chest: Effort normal and breath sounds normal.  Abdominal: He exhibits no distension. There is tenderness in the right lower quadrant. There is tenderness at McBurney's point. There is no rigidity and no guarding.  Musculoskeletal: Normal range of motion. He exhibits no edema and no tenderness.  Neurological: He is alert and oriented to person, place, and time.  Skin: Skin is warm and dry.  Psychiatric: He has a normal mood and affect.    ED Course  Procedures (including critical care time)  Labs Reviewed  COMPREHENSIVE METABOLIC PANEL - Abnormal; Notable for the following:    Glucose, Bld 120 (*)    Total Bilirubin 2.1 (*)    GFR calc non Af Amer 75 (*)    GFR calc Af Amer 87 (*)    All other components within normal limits  CBC WITH DIFFERENTIAL - Abnormal; Notable for the following:    Neutrophils Relative 79 (*)    Neutro Abs 8.0 (*)    Lymphocytes Relative 11 (*)    All other components within normal limits  LIPASE, BLOOD   Ct Abdomen Pelvis Wo Contrast  08/06/2012  *RADIOLOGY REPORT*  Clinical Data: 52 year old male with right-sided abdominal and pelvic pain.  CT ABDOMEN AND PELVIS WITHOUT CONTRAST  Technique:  Multidetector CT imaging of the abdomen and pelvis was performed following the standard protocol without intravenous contrast.  Comparison: None  Findings: The appendix is enlarged with adjacent inflammation compatible with appendicitis.  There is no evidence of free fluid, abscess or pneumoperitoneum.  A hepatic cyst is noted but the remainder of the liver is unremarkable. Cholelithiasis identified without CT evidence of acute cholecystitis. The spleen, adrenal glands, kidneys and pancreas are unremarkable.   Please note that parenchymal abnormalities may be missed as intravenous contrast was not administered.  No free fluid, enlarged lymph nodes, biliary dilation or abdominal aortic aneurysm identified.  No other bowel abnormalities are identified. The bladder is within normal limits. No acute or suspicious bony abnormalities are present.  IMPRESSION: Appendicitis without evidence of rupture.  Cholelithiasis without CT evidence of acute cholecystitis.  These results were called to Sandford Craze on 08/06/2012 at 2:53 p.m.   Original Report Authenticated By: Harmon Pier, M.D.    1. Acute appendicitis     MDM  52 y/o male who presents with cc of abdominal pain. Afebrile. HDS. No peritonitis on exam.  exam. Was seen earlier today and had a CT scan performed which revealed acute appendicitis. General surgery consulted with plans to take to OR.         Shanon Ace, MD 08/06/12 2004

## 2012-08-06 NOTE — Telephone Encounter (Signed)
Patient Information:  Caller Name: Ameet  Phone: [phone]  Patient: Dylan Hudson, Dylan Hudson  Gender: Male  DOB: 04-02-1961  Age: 52 Years  PCP: Marguarite Arbour (Adults only)  Office Follow Up:  Does the office need to follow up with this patient?: No  Instructions For The Office: N/A   Symptoms  Reason For Call & Symptoms: Right lower quadrant pain that started on 3/18.  No bm today or yesterday.  No change in his urination.  Reviewed Health History In EMR: Yes  Reviewed Medications In EMR: Yes  Reviewed Allergies In EMR: Yes  Reviewed Surgeries / Procedures: Yes  Date of Onset of Symptoms: 08/05/2012  Guideline(s) Used:  Abdominal Pain - Male  Disposition Per Guideline:   Go to ED Now (or to Office with PCP Approval)  Reason For Disposition Reached:   Constant abdominal pain lasting > 2 hours  Advice Given:  N/A  Patient Will Follow Care Advice:  YES  Appointment Scheduled:  08/06/2012 11:15:00 Appointment Scheduled Provider:  Sandford Craze (Adults only)

## 2012-08-06 NOTE — Assessment & Plan Note (Signed)
Obtain CT abd/pelvis to further evaluate.  Need to rule out appendicitis.  Will obtain baseline laboratories as well.  EKG performed today is reviewed and compared to previous and appears stable/unchanged.

## 2012-08-06 NOTE — Anesthesia Postprocedure Evaluation (Signed)
  Anesthesia Post-op Note  Patient: Dylan Hudson.  Procedure(s) Performed: Procedure(s): APPENDECTOMY LAPAROSCOPIC (N/A)  Patient Location: PACU  Anesthesia Type:General  Level of Consciousness: awake, oriented and patient cooperative  Airway and Oxygen Therapy: Patient Spontanous Breathing  Post-op Pain: mild  Post-op Assessment: Post-op Vital signs reviewed, Patient's Cardiovascular Status Stable, Respiratory Function Stable, Patent Airway, No signs of Nausea or vomiting and Pain level controlled  Post-op Vital Signs: stable  Complications: No apparent anesthesia complications

## 2012-08-06 NOTE — Progress Notes (Signed)
Subjective:    Patient ID: Dylan Shorter., male    DOB: Mar 05, 1961, 52 y.o.   MRN: [id number]  HPI  Dylan Hudson is a 52 yr old male who presents today with chief complaint of right lower quadrant pain. Pain started yesterday morning. Has had associated "burning in his stomach" after eating a tangerine.  Reports he then developed right lower quadrant pain. He had chills, but denies associated fever.  He denies associated vomitting. Mild nausea. No BM yesterday or today, which is unusual.   Review of Systems See HPI  Past Medical History  Diagnosis Date  . CAD (coronary artery disease)     s/p CABG 01/07  . Gilbert's syndrome     possible. (elevated bilirubin)  . Benign positional vertigo   . Hx of adenomatous colonic polyps     Medoff  . Hyperlipidemia   . Hypertension   . Memory loss   . Hemochromatosis   . Allergy     seasonal  . Myocardial infarction 05/2005  . Internal hemorrhoid   . Diverticula of colon   . Tobacco abuse   . COPD (chronic obstructive pulmonary disease)   . Vertigo   . Amenia     postoperative    History   Social History  . Marital Status: Divorced    Spouse Name: N/A    Number of Children: N/A  . Years of Education: N/A   Occupational History  . Service Games developer   Social History Main Topics  . Smoking status: Former Smoker -- 1.00 packs/day for 36 years    Types: Cigarettes    Quit date: 03/07/2006  . Smokeless tobacco: Never Used     Comment: smoked since age 88 approx. 1 pact to 1 and a half packs a day. fortunately after his bypass surgery he completely discontinued smoking  . Alcohol Use: 2.4 oz/week    4 Cans of beer per week     Comment: socially  . Drug Use: No  . Sexually Active: Not on file   Other Topics Concern  . Not on file   Social History Narrative   Lives alone. Fishing is a hobby.     Past Surgical History  Procedure Laterality Date  . Coronary artery bypass graft    . Median sternotomy       for CABG x 4 (left internal mammary artery to distal left anterior descending coronary artery, right internal mammary artery to first circumflex marginal branch, saphenous vein graft to posterior descending coronary artery, saphenous vein graft to diagonal branch, endoscopic saphenous vein harvest from right thigh.) SURGEON: Salvatore Decent. Cornelius Moras, M.D. CHO/MEDQ D:06/08/05. T: 06/09/2005 Job: [id number]  . Left knee arthroscopy    . Colonoscopy  2003, 2007, 02/2011    small adenomas    Family History  Problem Relation Age of Onset  . Breast cancer Neg Hx   . Prostate cancer Neg Hx   . Colon polyps Mother   . Colon cancer Paternal Uncle     Allergies  Allergen Reactions  . Simvastatin     REACTION: Myalgias    Current Outpatient Prescriptions on File Prior to Visit  Medication Sig Dispense Refill  . aspirin (RA ASPIRIN) 325 MG tablet Take 325 mg by mouth daily.        Marland Kitchen atorvastatin (LIPITOR) 10 MG tablet Take 1 tablet every 3 days.      . fish oil-omega-3 fatty acids 1000 MG capsule Take 1  g by mouth 2 (two) times daily.        . fluticasone (FLONASE) 50 MCG/ACT nasal spray Place 2 sprays into the nose daily.      . hydrocortisone (ANUSOL-HC) 25 MG suppository Place 1 suppository (25 mg total) rectally at bedtime as needed for hemorrhoids (nightly x 7 nights then as needed for rectal bleeding).  24 suppository  0  . lansoprazole (PREVACID) 15 MG capsule Take 15 mg by mouth every other day.      . lisinopril (PRINIVIL,ZESTRIL) 10 MG tablet Take 5 mg by mouth daily.      Marland Kitchen loratadine (CLARITIN) 10 MG tablet Take 10 mg by mouth daily as needed. For allergies      . metoprolol succinate (TOPROL-XL) 50 MG 24 hr tablet TAKE 1 TABLET (50 MG TOTAL) BY MOUTH DAILY.  30 tablet  2   No current facility-administered medications on file prior to visit.    BP 110/80  Pulse 60  Temp(Src) 98.9 F (37.2 C) (Oral)  Resp 16  Ht 6' (1.829 m)  Wt 222 lb 1.9 oz (100.753 kg)  BMI 30.12 kg/m2  SpO2  99%       Objective:   Physical Exam  Constitutional: He is oriented to person, place, and time. He appears well-developed and well-nourished. No distress.  Cardiovascular: Normal rate and regular rhythm.   No murmur heard. Pulmonary/Chest: Effort normal and breath sounds normal. No respiratory distress. He has no wheezes. He has no rales. He exhibits no tenderness.  Abdominal: Soft. Bowel sounds are decreased.  Mild epigastric pain, + RLQ pain with mild guarding. Neg CVAT bilaterally  Musculoskeletal: He exhibits no edema.  Neurological: He is alert and oriented to person, place, and time.  Psychiatric: He has a normal mood and affect. His behavior is normal. Judgment and thought content normal.          Assessment & Plan:

## 2012-08-06 NOTE — Patient Instructions (Addendum)
Please complete lab work prior to leaving. Complete CT on the first floor- we will contact you with your results. Follow up in 1 week.

## 2012-08-06 NOTE — Transfer of Care (Signed)
Immediate Anesthesia Transfer of Care Note  Patient: Dylan Hudson.  Procedure(s) Performed: Procedure(s): APPENDECTOMY LAPAROSCOPIC (N/A)  Patient Location: PACU  Anesthesia Type:General  Level of Consciousness: awake, alert , oriented and patient cooperative  Airway & Oxygen Therapy: Patient Spontanous Breathing and Patient connected to nasal cannula oxygen  Post-op Assessment: Report given to PACU RN, Post -op Vital signs reviewed and stable and Patient moving all extremities X 4  Post vital signs: Reviewed and stable  Complications: No apparent anesthesia complications

## 2012-08-06 NOTE — Anesthesia Preprocedure Evaluation (Addendum)
Anesthesia Evaluation  Patient identified by MRN, date of birth, ID band Patient awake    Reviewed: Allergy & Precautions, H&P , NPO status , Patient's Chart, lab work & pertinent test results  Airway Mallampati: II TM Distance: >3 FB Neck ROM: Full   Comment: Small mouth Dental  (+) Dental Advisory Given,    Pulmonary COPDformer smoker,  breath sounds clear to auscultation        Cardiovascular + CAD, + Past MI and + CABG Rhythm:Regular Rate:Normal     Neuro/Psych    GI/Hepatic GERD-  Medicated,Patient received Oral Contrast Agents,  Endo/Other    Renal/GU      Musculoskeletal   Abdominal   Peds  Hematology   Anesthesia Other Findings Hemochromatosis  Reproductive/Obstetrics                          Anesthesia Physical Anesthesia Plan  ASA: III  Anesthesia Plan: General   Post-op Pain Management:    Induction: Intravenous, Cricoid pressure planned and Rapid sequence  Airway Management Planned: Oral ETT  Additional Equipment:   Intra-op Plan:   Post-operative Plan: Extubation in OR  Informed Consent:   Dental advisory given  Plan Discussed with: Anesthesiologist, Surgeon and CRNA  Anesthesia Plan Comments:        Anesthesia Quick Evaluation

## 2012-08-06 NOTE — Telephone Encounter (Signed)
Received call from Dr. Jena Gauss.  Pt has appendicitis- I advised pt to go to the ED at [facility] for evaluation. He verbalizes understanding and agrees to proceed. Report given to [facility]- charge nurse at [facility] ED.

## 2012-08-06 NOTE — H&P (Signed)
Dylan Dominic. is an 52 y.o. male.   Chief Complaint: abdominal pain HPI: 52 yo male presents with 1 1/2 days of "heartburn" that progressed to periumbilical pain, then RLQ pain.  He has had some chills.  No nausea or vomiting.  Ate lunch today.  Evaluated at Urgent Care - referred for CT scan which showed appendicitis.  Past Medical History  Diagnosis Date  . CAD (coronary artery disease)     s/p CABG 01/07  . Gilbert's syndrome     possible. (elevated bilirubin)  . Benign positional vertigo   . Hx of adenomatous colonic polyps     Medoff  . Hyperlipidemia   . Hypertension   . Memory loss   . Hemochromatosis   . Allergy     seasonal  . Myocardial infarction 05/2005  . Internal hemorrhoid   . Diverticula of colon   . Tobacco abuse   . COPD (chronic obstructive pulmonary disease)   . Vertigo   . Amenia     postoperative    Past Surgical History  Procedure Laterality Date  . Coronary artery bypass graft    . Median sternotomy      for CABG x 4 (left internal mammary artery to distal left anterior descending coronary artery, right internal mammary artery to first circumflex marginal branch, saphenous vein graft to posterior descending coronary artery, saphenous vein graft to diagonal branch, endoscopic saphenous vein harvest from right thigh.) SURGEON: Salvatore Decent. Cornelius Moras, M.D. CHO/MEDQ D:06/08/05. T: 06/09/2005 Job: [id number]  . Left knee arthroscopy    . Colonoscopy  2003, 2007, 02/2011    small adenomas    Family History  Problem Relation Age of Onset  . Breast cancer Neg Hx   . Prostate cancer Neg Hx   . Colon polyps Mother   . Colon cancer Paternal Uncle    Social History:  reports that he quit smoking about 6 years ago. His smoking use included Cigarettes. He has a 36 pack-year smoking history. He has never used smokeless tobacco. He reports that he drinks about 2.4 ounces of alcohol per week. He reports that he does not use illicit drugs.  Allergies:  Allergies   Allergen Reactions  . Simvastatin     REACTION: Myalgias    Meds:Current Outpatient Rx  Name  Route  Sig  Dispense  Refill  . aspirin (RA ASPIRIN) 325 MG tablet  Oral  Take 325 mg by mouth daily.  Marland Kitchen atorvastatin (LIPITOR) 10 MG tablet  Take 1 tablet every 3 days.  . fish oil-omega-3 fatty acids 1000 MG capsule  Oral  Take 1 g by mouth 2 (two) times daily.  . fluticasone (FLONASE) 50 MCG/ACT nasal spray  Nasal  Place 2 sprays into the nose daily.  . hydrocortisone (ANUSOL-HC) 25 MG suppository  Rectal  Place 1 suppository (25 mg total) rectally at bedtime as needed for hemorrhoids (nightly x 7 nights then as needed for rectal bleeding).  24 suppository  0  . lansoprazole (PREVACID) 15 MG capsule  Oral  Take 15 mg by mouth every other day.  . lisinopril (PRINIVIL,ZESTRIL) 10 MG tablet  Oral  Take 5 mg by mouth daily.  Marland Kitchen loratadine (CLARITIN) 10 MG tablet  Oral  Take 10 mg by mouth daily as needed. For allergies  . metoprolol succinate (TOPROL-XL) 50 MG 24 hr tablet  TAKE 1 TABLET (50 MG TOTAL) BY MOUTH DAILY.  30 tablet  2  No refills available   Results for orders placed  during the hospital encounter of 08/06/12 (from the past 48 hour(s))  LIPASE, BLOOD     Status: None   Collection Time    08/06/12  3:55 PM      Result Value Range   Lipase 18  11 - 59 U/L  COMPREHENSIVE METABOLIC PANEL     Status: Abnormal   Collection Time    08/06/12  3:55 PM      Result Value Range   Sodium 137  135 - 145 mEq/L   Potassium 3.7  3.5 - 5.1 mEq/L   Chloride 100  96 - 112 mEq/L   CO2 27  19 - 32 mEq/L   Glucose, Bld 120 (*) 70 - 99 mg/dL   BUN 12  6 - 23 mg/dL   Creatinine, Ser 4.54  0.50 - 1.35 mg/dL   Calcium 9.7  8.4 - 09.8 mg/dL   Total Protein 7.2  6.0 - 8.3 g/dL   Albumin 3.9  3.5 - 5.2 g/dL   AST 22  0 - 37 U/L   ALT 32  0 - 53 U/L   Alkaline Phosphatase 52  39 - 117 U/L   Total Bilirubin 2.1 (*) 0.3 - 1.2 mg/dL   GFR calc non Af Amer 75 (*) >90 mL/min   GFR  calc Af Amer 87 (*) >90 mL/min   Comment:            The eGFR has been calculated     using the CKD EPI equation.     This calculation has not been     validated in all clinical     situations.     eGFR's persistently     <90 mL/min signify     possible Chronic Kidney Disease.  CBC WITH DIFFERENTIAL     Status: Abnormal   Collection Time    08/06/12  3:55 PM      Result Value Range   WBC 10.1  4.0 - 10.5 K/uL   RBC 4.90  4.22 - 5.81 MIL/uL   Hemoglobin 14.5  13.0 - 17.0 g/dL   HCT 11.9  14.7 - 82.9 %   MCV 86.7  78.0 - 100.0 fL   MCH 29.6  26.0 - 34.0 pg   MCHC 34.1  30.0 - 36.0 g/dL   RDW 56.2  13.0 - 86.5 %   Platelets 163  150 - 400 K/uL   Neutrophils Relative 79 (*) 43 - 77 %   Neutro Abs 8.0 (*) 1.7 - 7.7 K/uL   Lymphocytes Relative 11 (*) 12 - 46 %   Lymphs Abs 1.2  0.7 - 4.0 K/uL   Monocytes Relative 10  3 - 12 %   Monocytes Absolute 1.0  0.1 - 1.0 K/uL   Eosinophils Relative 0  0 - 5 %   Eosinophils Absolute 0.0  0.0 - 0.7 K/uL   Basophils Relative 0  0 - 1 %   Basophils Absolute 0.0  0.0 - 0.1 K/uL   Ct Abdomen Pelvis Wo Contrast  08/06/2012  *RADIOLOGY REPORT*  Clinical Data: 51 year old male with right-sided abdominal and pelvic pain.  CT ABDOMEN AND PELVIS WITHOUT CONTRAST  Technique:  Multidetector CT imaging of the abdomen and pelvis was performed following the standard protocol without intravenous contrast.  Comparison: None  Findings: The appendix is enlarged with adjacent inflammation compatible with appendicitis.  There is no evidence of free fluid, abscess or pneumoperitoneum.  A hepatic cyst is noted but the remainder of the liver is  unremarkable. Cholelithiasis identified without CT evidence of acute cholecystitis. The spleen, adrenal glands, kidneys and pancreas are unremarkable.  Please note that parenchymal abnormalities may be missed as intravenous contrast was not administered.  No free fluid, enlarged lymph nodes, biliary dilation or abdominal aortic  aneurysm identified.  No other bowel abnormalities are identified. The bladder is within normal limits. No acute or suspicious bony abnormalities are present.  IMPRESSION: Appendicitis without evidence of rupture.  Cholelithiasis without CT evidence of acute cholecystitis.  These results were called to Sandford Craze on 08/06/2012 at 2:53 p.m.   Original Report Authenticated By: Harmon Pier, M.D.     Review of Systems  Eyes: Negative.   Gastrointestinal: Positive for heartburn and abdominal pain.  Neurological: Negative.   Endo/Heme/Allergies: Negative.     Blood pressure 116/66, pulse 75, temperature 98.1 F (36.7 C), temperature source Oral, SpO2 98.00%. Physical Exam  WDWN in NAD HEENT:  EOMI, sclera anicteric Neck:  No masses, no thyromegaly Lungs:  CTA bilaterally; normal respiratory effort CV:  Regular rate and rhythm; no murmurs Abd:  +bowel sounds, protuberant, tender in RLQ, no palpable masses Ext:  Well-perfused; no edema Skin:  Warm, dry; no sign of jaundice  Assessment/Plan Acute appendicitis Plan:  Laparoscopic appendectomy, possible open appendectomy.  The surgical procedure has been discussed with the patient.  Potential risks, benefits, alternative treatments, and expected outcomes have been explained.  All of the patient's questions at this time have been answered.  The likelihood of reaching the patient's treatment goal is good.  The patient understand the proposed surgical procedure and wishes to proceed.   Minal Stuller K. 08/06/2012, 6:24 PM

## 2012-08-06 NOTE — Op Note (Signed)
Appendectomy, Lap, Procedure Note  Indications: The patient presented with a history of right-sided abdominal pain. A CT scan revealed findings consistent with acute appendicitis.  Pre-operative Diagnosis: Acute appendicitis without mention of peritonitis  Post-operative Diagnosis: Same  Surgeon: Jessy Cybulski K.   Assistants: noen  Anesthesia: General endotracheal anesthesia  ASA Class: 2E  Procedure Details  The patient was seen again in the Holding Room. The risks, benefits, complications, treatment options, and expected outcomes were discussed with the patient and/or family. The possibilities of reaction to medication, perforation of viscus, bleeding, recurrent infection, finding a normal appendix, the need for additional procedures, failure to diagnose a condition, and creating a complication requiring transfusion or operation were discussed. There was concurrence with the proposed plan and informed consent was obtained. The site of surgery was properly noted. The patient was taken to Operating Room, identified as Dylan Hudson. and the procedure verified as Appendectomy. A Time Out was held and the above information confirmed.  The patient was placed in the supine position and general anesthesia was induced.  The abdomen was prepped and draped in a sterile fashion. A one centimeter supraumbilical incision was made.  Dissection was carried down to the fascia bluntly.  The fascia was incised vertically.  We entered the peritoneal cavity bluntly.  A pursestring suture was passed around the incision with a 0 Vicryl.  The Hasson cannula was introduced into the abdomen and the tails of the suture were used to hold the Hasson in place.   The pneumoperitoneum was then established maintaining a maximum pressure of 15 mmHg.  Additional 5 mm cannulas then placed in the left lower quadrant of the abdomen and the right upper quadrant under direct visualization. A careful evaluation of the entire  abdomen was carried out. The patient was placed in Trendelenburg and left lateral decubitus position.  The scope was moved to the right upper quadrant port site. The cecum was mobilized medially.  The appendix was edematous and adherent to the abdominal wall. The appendix was carefully dissected. The appendix was was skeletonized with the harmonic scalpel.   The appendix was divided at its base using an endo-GIA stapler. Minimal appendiceal stump was left in place. The appendix was placed in an endocatch sac and removed through the umbilical port site.  There was no evidence of bleeding, leakage, or complication after division of the appendix. Irrigation was also performed and irrigate suctioned from the abdomen as well.  The umbilical port site was closed with the purse string suture. There was no residual palpable fascial defect.  The trocar site skin wounds were closed with 4-0 Monocryl.  Instrument, sponge, and needle counts were correct at the conclusion of the case.   Findings: The appendix was found to be inflamed. There were not signs of necrosis.  There was not perforation. There was not abscess formation.  Estimated Blood Loss:  Minimal         Drains: none          Specimens: appendix         Complications:  None; patient tolerated the procedure well.         Disposition: PACU - hemodynamically stable.         Condition: stable  [name]. [name], MD, [facility]  08/06/2012 10:44 PM

## 2012-08-06 NOTE — ED Notes (Signed)
Pt reports abd pain that started yesterday after eating, pain is RLQ with nausea. Went for ct scan today and told to come here for appendicitis, no acute distress noted at this time.

## 2012-08-07 ENCOUNTER — Encounter (HOSPITAL_COMMUNITY): Payer: Self-pay | Admitting: Surgery

## 2012-08-07 MED ORDER — OXYCODONE-ACETAMINOPHEN 5-325 MG PO TABS: 1.0000 | ORAL_TABLET | ORAL | Status: DC | PRN

## 2012-08-07 NOTE — ED Provider Notes (Signed)
Medical screening examination/treatment/procedure(s) were conducted as a shared visit with non-physician practitioner(s) and myself.  I personally evaluated the patient during the encounter.  Patient seen for appendicitis diagnosed as an outpatient by CT. Patient has been experiencing abdominal pain since yesterday, first generalized, now today focal right lower quadrant. General surgery consult for, patient admitted for surgery.  Gilda Crease, MD 08/07/12 [phone]

## 2012-08-07 NOTE — Discharge Instructions (Signed)
[facility] ______[facility] °LAPAROSCOPIC SURGERY: POST OP INSTRUCTIONS °Always review your discharge instruction sheet given to you by the facility where your surgery was performed. °IF YOU HAVE DISABILITY OR FAMILY LEAVE FORMS, YOU MUST BRING THEM TO THE OFFICE FOR PROCESSING.   °DO NOT GIVE THEM TO YOUR DOCTOR. ° °1. A prescription for pain medication may be given to you upon discharge.  Take your pain medication as prescribed, if needed.  If narcotic pain medicine is not needed, then you may take acetaminophen (Tylenol) or ibuprofen (Advil) as needed. °2. Take your usually prescribed medications unless otherwise directed. °3. If you need a refill on your pain medication, please contact your pharmacy.  They will contact our office to request authorization. Prescriptions will not be filled after 5pm or on week-ends. °4. You should follow a light diet the first few days after arrival home, such as soup and crackers, etc.  Be sure to include lots of fluids daily. °5. Most patients will experience some swelling and bruising in the area of the incisions.  Ice packs will help.  Swelling and bruising can take several days to resolve.  °6. It is common to experience some constipation if taking pain medication after surgery.  Increasing fluid intake and taking a stool softener (such as Colace) will usually help or prevent this problem from occurring.  A mild laxative (Milk of Magnesia or Miralax) should be taken according to package instructions if there are no bowel movements after 48 hours. °7. Unless discharge instructions indicate otherwise, you may remove your bandages 24-48 hours after surgery, and you may shower at that time.  You may have steri-strips (small skin tapes) in place directly over the incision.  These strips should be left on the skin for 7-10 days.  If your surgeon used skin glue on the incision, you may shower in 24 hours.  The glue will flake off over the next 2-3 weeks.  Any sutures or  staples will be removed at the office during your follow-up visit. °8. ACTIVITIES:  You may resume regular (light) daily activities beginning the next day--such as daily self-care, walking, climbing stairs--gradually increasing activities as tolerated.  You may have sexual intercourse when it is comfortable.  Refrain from any heavy lifting or straining until approved by your doctor. °a. You may drive when you are no longer taking prescription pain medication, you can comfortably wear a seatbelt, and you can safely maneuver your car and apply brakes. °b. RETURN TO WORK:  __________________________________________________________ °9. You should see your doctor in the office for a follow-up appointment approximately 2-3 weeks after your surgery.  Make sure that you call for this appointment within a day or two after you arrive home to insure a convenient appointment time. °10. OTHER INSTRUCTIONS: __________________________________________________________________________________________________________________________ __________________________________________________________________________________________________________________________ °WHEN TO CALL YOUR DOCTOR: °1. Fever over 101.0 °2. Inability to urinate °3. Continued bleeding from incision. °4. Increased pain, redness, or drainage from the incision. °5. Increasing abdominal pain ° °The clinic staff is available to answer your questions during regular business hours.  Please don’t hesitate to call and ask to speak to one of the nurses for clinical concerns.  If you have a medical emergency, go to the nearest emergency room or call 911.  A surgeon from [facility] is always on call at the hospital. °[address], [address], [place], [place]  [address] ? [address] [address], [place], [place]   [address] °[phone] ? [phone] ? FAX [phone] °Web site:   [email or web address] °

## 2012-08-07 NOTE — Progress Notes (Signed)
Discharge to home with RX and FU care with MD wnt over S/S of infection / sites pt understood  Tolerated both meals today up walking the unit no complaints at this time home wife  IV out

## 2012-08-07 NOTE — Discharge Summary (Signed)
  Physician Discharge Summary  Patient ID: Dylan Hudson. MRN: [id number] DOB/AGE: March 19, 1961 52 y.o.  Admit date: 08/06/2012 Discharge date: 08/07/2012  Admitting Diagnosis: Acute Appendicitis  Discharge Diagnosis Acute Appendicitis  Consultants None  Procedures Laparoscopic Appendectomy  Hospital Course 52 yr old male who presented to [facility] with abdominal pain.  Workup showed appendicitis.  Patient was admitted and underwent procedure listed above.  Tolerated procedure well and was transferred to the floor.  Diet was advanced as tolerated.  On POD#1, the patient was voiding well, tolerating diet, ambulating well, pain well controlled, vital signs stable, incisions c/d/i and felt stable for discharge home.  Patient will follow up in our office in 2 weeks and knows to call with questions or concerns.    Medication List    TAKE these medications       aspirin 325 MG tablet  Take 325 mg by mouth daily.     atorvastatin 10 MG tablet  Commonly known as:  LIPITOR  Take 10 mg by mouth every 3 (three) days.     fish oil-omega-3 fatty acids 1000 MG capsule  Take 1 g by mouth 2 (two) times daily.     fluticasone 50 MCG/ACT nasal spray  Commonly known as:  FLONASE  Place 2 sprays into the nose daily.     lansoprazole 15 MG capsule  Commonly known as:  PREVACID  Take 15 mg by mouth every other day.     lisinopril 10 MG tablet  Commonly known as:  PRINIVIL,ZESTRIL  Take 5 mg by mouth daily.     loratadine 10 MG tablet  Commonly known as:  CLARITIN  Take 10 mg by mouth daily as needed. For allergies     metoprolol succinate 50 MG 24 hr tablet  Commonly known as:  TOPROL-XL  Take 50 mg by mouth daily. Take with or immediately following a meal.     oxyCODONE-acetaminophen 5-325 MG per tablet  Commonly known as:  PERCOCET/ROXICET  Take 1-2 tablets by mouth every 4 (four) hours as needed.             Follow-up Information   Follow up with [facility].  Schedule an appointment as soon as possible for a visit in 2 weeks. (our office will contact you with your appt day and time)    Contact information:   [address]   [place] [address] [phone]       Signed: Denny Levy [facility] [phone]  08/07/2012, 9:26 AM

## 2012-08-08 LAB — URINE CULTURE
Colony Count: NO GROWTH
Organism ID, Bacteria: NO GROWTH

## 2012-08-08 NOTE — Discharge Summary (Signed)
I did examine the patient prior to discharge and he was doing great.  Should be stable for discharge

## 2012-08-13 ENCOUNTER — Ambulatory Visit (INDEPENDENT_AMBULATORY_CARE_PROVIDER_SITE_OTHER): Payer: 59 | Admitting: Family

## 2012-08-13 ENCOUNTER — Encounter: Payer: Self-pay | Admitting: Family

## 2012-08-13 VITALS — BP 110/74 | HR 52 | Temp 97.6°F | Resp 16 | Ht 72.0 in | Wt 223.1 lb

## 2012-08-13 DIAGNOSIS — K3580 Unspecified acute appendicitis: Secondary | ICD-10-CM | POA: Insufficient documentation

## 2012-08-13 DIAGNOSIS — R7309 Other abnormal glucose: Secondary | ICD-10-CM

## 2012-08-13 DIAGNOSIS — R739 Hyperglycemia, unspecified: Secondary | ICD-10-CM

## 2012-08-13 LAB — HEMOGLOBIN A1C: Mean Plasma Glucose: 108 mg/dL (ref <117)

## 2012-08-13 NOTE — Patient Instructions (Addendum)
Please complete your lab work prior to leaving. Keep upcoming follow up with Dr. [name]. Follow up in 3 months.

## 2012-08-13 NOTE — Progress Notes (Signed)
Subjective:    Patient ID: Dylan Hudson., male    DOB: April 24, 1961, 52 y.o.   MRN: [id number]  HPI  Dylan Hudson is a 52 yr old male who presents today for follow up.  He was seen one week ago and diagnosed with acute appendicitis. He underwent laparoscopic appendectomy on 3/19.  Surgery and recovery have been uneventful so far. Denies fever, nausea, vomitting.    Review of Systems See HPi  Past Medical History  Diagnosis Date  . CAD (coronary artery disease)     s/p CABG 01/07  . Gilbert's syndrome     possible. (elevated bilirubin)  . Benign positional vertigo   . Hx of adenomatous colonic polyps     Medoff  . Hyperlipidemia   . Memory loss   . Hemochromatosis   . Allergy     seasonal  . Myocardial infarction 05/2005  . Internal hemorrhoid   . Diverticula of colon   . Tobacco abuse   . Vertigo   . Amenia     postoperative  . Hypertension     History   Social History  . Marital Status: Divorced    Spouse Name: N/A    Number of Children: N/A  . Years of Education: N/A   Occupational History  . Service Games developer   Social History Main Topics  . Smoking status: Former Smoker -- 1.00 packs/day for 36 years    Types: Cigarettes    Quit date: 03/07/2006  . Smokeless tobacco: Never Used     Comment: smoked since age 49 approx. 1 pact to 1 and a half packs a day. fortunately after his bypass surgery he completely discontinued smoking  . Alcohol Use: 2.4 oz/week    4 Cans of beer per week     Comment: socially  . Drug Use: No  . Sexually Active: Not on file   Other Topics Concern  . Not on file   Social History Narrative   Lives alone. Fishing is a hobby.     Past Surgical History  Procedure Laterality Date  . Coronary artery bypass graft    . Median sternotomy      for CABG x 4 (left internal mammary artery to distal left anterior descending coronary artery, right internal mammary artery to first circumflex marginal branch, saphenous  vein graft to posterior descending coronary artery, saphenous vein graft to diagonal branch, endoscopic saphenous vein harvest from right thigh.) SURGEON: Salvatore Decent. Cornelius Moras, M.D. CHO/MEDQ D:06/08/05. T: 06/09/2005 Job: [id number]  . Left knee arthroscopy    . Colonoscopy  2003, 2007, 02/2011    small adenomas  . Laparoscopic appendectomy N/A 08/06/2012    Procedure: APPENDECTOMY LAPAROSCOPIC;  Surgeon: [name]. [name], MD;  Location: [facility] OR;  Service: General;  Laterality: N/A;    Family History  Problem Relation Age of Onset  . Breast cancer Neg Hx   . Prostate cancer Neg Hx   . Colon polyps Mother   . Colon cancer Paternal Uncle     Allergies  Allergen Reactions  . Simvastatin     REACTION: Myalgias    Current Outpatient Prescriptions on File Prior to Visit  Medication Sig Dispense Refill  . aspirin 325 MG tablet Take 325 mg by mouth daily.      Marland Kitchen atorvastatin (LIPITOR) 10 MG tablet Take 10 mg by mouth every 3 (three) days.       . fish oil-omega-3 fatty acids 1000 MG capsule Take  1 g by mouth 2 (two) times daily.        . fluticasone (FLONASE) 50 MCG/ACT nasal spray Place 2 sprays into the nose daily.      . lansoprazole (PREVACID) 15 MG capsule Take 15 mg by mouth every other day.      . lisinopril (PRINIVIL,ZESTRIL) 10 MG tablet Take 5 mg by mouth daily.      Marland Kitchen loratadine (CLARITIN) 10 MG tablet Take 10 mg by mouth daily as needed. For allergies      . metoprolol succinate (TOPROL-XL) 50 MG 24 hr tablet Take 50 mg by mouth daily. Take with or immediately following a meal.      . oxyCODONE-acetaminophen (PERCOCET/ROXICET) 5-325 MG per tablet Take 1-2 tablets by mouth every 4 (four) hours as needed.  60 tablet  0   No current facility-administered medications on file prior to visit.    BP 110/74  Pulse 52  Temp(Src) 97.6 F (36.4 C) (Oral)  Resp 16  Ht 6' (1.829 m)  Wt 223 lb 1.9 oz (101.207 kg)  BMI 30.25 kg/m2  SpO2 98%       Objective:   Physical Exam   Constitutional: He appears well-developed and well-nourished. No distress.  Cardiovascular: Normal rate and regular rhythm.   No murmur heard. Pulmonary/Chest: Effort normal and breath sounds normal. No respiratory distress. He has no wheezes. He has no rales. He exhibits no tenderness.  Abdominal: Soft. Bowel sounds are normal. He exhibits no distension. There is no tenderness.  Lap incisions dry and intact. Steri strips in place with scant dry blood.          Assessment & Plan:

## 2012-08-13 NOTE — Assessment & Plan Note (Signed)
Glucose elevated while in hospital to 125. Will check A1C to exclude diabetes.

## 2012-08-13 NOTE — Assessment & Plan Note (Signed)
S/p Lap appendectomy.  Stable post op.  Pt instructed to keep upcoming follow up with surgeon and follow up here in 3 months.

## 2012-08-26 ENCOUNTER — Ambulatory Visit (INDEPENDENT_AMBULATORY_CARE_PROVIDER_SITE_OTHER): Payer: 59 | Admitting: General Surgery

## 2012-08-26 ENCOUNTER — Encounter (INDEPENDENT_AMBULATORY_CARE_PROVIDER_SITE_OTHER): Payer: Self-pay

## 2012-08-26 VITALS — BP 124/86 | HR 57 | Temp 97.3°F | Resp 16 | Ht 72.0 in | Wt 220.4 lb

## 2012-08-26 DIAGNOSIS — Z9889 Other specified postprocedural states: Secondary | ICD-10-CM

## 2012-08-26 DIAGNOSIS — Z9049 Acquired absence of other specified parts of digestive tract: Secondary | ICD-10-CM

## 2012-08-26 NOTE — Progress Notes (Signed)
Dylan Hudson 02-17-1961 [id number] 08/26/2012   History of Present Illness: Dylan Hudson. is a  52 y.o. male who presents today status post lap appy by Dr. [name] dated 08/06/12.  Pathology reveals acute full thickness appendicitis and serositis with periappendiceal abscess formation, no tumor was seen.  I discussed the findings with the patient.  The patient is tolerating a regular diet, having normal bowel movements, has good pain control.  He returned to work the Monday following his surgery.  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  He  is back to most normal activities. He denies fever, chills or sweats.  Denies incision site drainage, erythema.  Physical Exam: Abd: soft, nontender, active bowel sounds, nondistended.  All incisions are well healed.  Impression: 1.  Acute appendicitis, s/p lap appy  Plan: He is able to return to normal activities. He will avoid lifting >40lbs for an additional 3 weeks to prevent hernia.  He  may follow up on a prn basis. Verbalizes understanding and will call with any questions or concerns.

## 2012-08-26 NOTE — Patient Instructions (Signed)
Avoid lifting >40lbs for the next 3 weeks.  Thereafter, you do not have any restrictions.   You may resume physical activities such as walking or riding a bike and increase as tolerated. Your incision sites are well healed. Please do not hesitate to contact our clinic with any questions or concerns.

## 2012-09-02 ENCOUNTER — Other Ambulatory Visit: Payer: Self-pay | Admitting: Cardiology

## 2012-09-02 ENCOUNTER — Other Ambulatory Visit: Payer: Self-pay | Admitting: Internal Medicine

## 2012-09-02 NOTE — Telephone Encounter (Signed)
Rx request to pharmacy/SLS  

## 2012-10-07 ENCOUNTER — Other Ambulatory Visit: Payer: Self-pay

## 2012-10-08 ENCOUNTER — Encounter: Payer: Self-pay | Admitting: Cardiology

## 2012-10-08 ENCOUNTER — Ambulatory Visit (INDEPENDENT_AMBULATORY_CARE_PROVIDER_SITE_OTHER): Payer: 59 | Admitting: Cardiology

## 2012-10-08 VITALS — BP 115/80 | HR 60 | Wt 228.0 lb

## 2012-10-08 DIAGNOSIS — E785 Hyperlipidemia, unspecified: Secondary | ICD-10-CM

## 2012-10-08 DIAGNOSIS — I10 Essential (primary) hypertension: Secondary | ICD-10-CM

## 2012-10-08 DIAGNOSIS — I2510 Atherosclerotic heart disease of native coronary artery without angina pectoris: Secondary | ICD-10-CM

## 2012-10-08 NOTE — Progress Notes (Signed)
HPI: Dylan Hudson is a pleasant gentleman who has a history of coronary artery disease. In 2007, the patient had cardiac catheterization that showed an ejection fraction of 45% with severe anterior apical hypokinesis and severe coronary disease. Dylan Hudson ultimately underwent coronary artery bypassing graft on June 04, 2005. At that time, Dylan Hudson had a saphenous vein graft to the PDA, status vein graft to the diagonal, a RIMA to the OM1, and a LIMA to the LAD. Echocardiogram in March of 2012 showed normal LV function, mild left atrial enlargement, mild tricuspid regurgitation and trace mitral regurgitation. Myoview in March of 2013 showed an ejection fraction of 68%. There was prior infarct but no ischemia. Also with h/o hemchromatosis. I last saw him in March of 2013. Since then the patient denies any dyspnea on exertion, orthopnea, PND, pedal edema, palpitations, syncope or chest pain.   Current Outpatient Prescriptions  Medication Sig Dispense Refill  . aspirin 325 MG tablet Take 325 mg by mouth daily.      Marland Kitchen atorvastatin (LIPITOR) 10 MG tablet Take 10 mg by mouth every 3 (three) days.       . fish oil-omega-3 fatty acids 1000 MG capsule Take 1 g by mouth 2 (two) times daily.        . fluticasone (FLONASE) 50 MCG/ACT nasal spray USE 2 SPRAYS INTO THE NOSE DAILY  16 g  1  . lansoprazole (PREVACID) 15 MG capsule Take 15 mg by mouth every other day.      . lisinopril (PRINIVIL,ZESTRIL) 10 MG tablet Take 5 mg by mouth daily.      Marland Kitchen loratadine (CLARITIN) 10 MG tablet Take 10 mg by mouth daily as needed. For allergies      . metoprolol succinate (TOPROL-XL) 50 MG 24 hr tablet Take 50 mg by mouth daily. Take with or immediately following a meal.       No current facility-administered medications for this visit.     Past Medical History  Diagnosis Date  . CAD (coronary artery disease)     s/p CABG 01/07  . Gilbert's syndrome     possible. (elevated bilirubin)  . Benign positional vertigo   . Hx of  adenomatous colonic polyps     Medoff  . Hyperlipidemia   . Memory loss   . Hemochromatosis   . Allergy     seasonal  . Myocardial infarction 05/2005  . Internal hemorrhoid   . Diverticula of colon   . Tobacco abuse   . Vertigo   . Amenia     postoperative  . Hypertension     Past Surgical History  Procedure Laterality Date  . Coronary artery bypass graft    . Median sternotomy      for CABG x 4 (left internal mammary artery to distal left anterior descending coronary artery, right internal mammary artery to first circumflex marginal branch, saphenous vein graft to posterior descending coronary artery, saphenous vein graft to diagonal branch, endoscopic saphenous vein harvest from right thigh.) SURGEON: Salvatore Decent. Cornelius Moras, M.D. CHO/MEDQ D:06/08/05. T: 06/09/2005 Job: [id number]  . Left knee arthroscopy    . Colonoscopy  2003, 2007, 02/2011    small adenomas  . Laparoscopic appendectomy N/A 08/06/2012    Procedure: APPENDECTOMY LAPAROSCOPIC;  Surgeon: [name]. [name], MD;  Location: [facility] OR;  Service: General;  Laterality: N/A;  . Appendectomy  2014    lap appy    History   Social History  . Marital Status: Divorced    Spouse Name:  N/A    Number of Children: N/A  . Years of Education: N/A   Occupational History  . Service Games developer   Social History Main Topics  . Smoking status: Former Smoker -- 1.00 packs/day for 36 years    Types: Cigarettes    Quit date: 03/07/2006  . Smokeless tobacco: Never Used     Comment: smoked since age 34 approx. 1 pact to 1 and a half packs a day. fortunately after his bypass surgery Dylan Hudson completely discontinued smoking  . Alcohol Use: 2.4 oz/week    4 Cans of beer per week     Comment: socially  . Drug Use: No  . Sexually Active: Not on file   Other Topics Concern  . Not on file   Social History Narrative   Lives alone. Fishing is a hobby.     ROS: no fevers or chills, productive cough, hemoptysis, dysphasia,  odynophagia, melena, hematochezia, dysuria, hematuria, rash, seizure activity, orthopnea, PND, pedal edema, claudication. Remaining systems are negative.  Physical Exam: Well-developed well-nourished in no acute distress.  Skin is warm and dry.  HEENT is normal.  Neck is supple.  Chest is clear to auscultation with normal expansion.  Cardiovascular exam is regular rate and rhythm.  Abdominal exam nontender or distended. No masses palpated. Extremities show no edema. neuro grossly intact  ECG 08/06/2012-sinus rhythm with pause and no ST changes.

## 2012-10-08 NOTE — Assessment & Plan Note (Signed)
Continue aspirin and statin. Most recent nuclear study low risk.

## 2012-10-08 NOTE — Assessment & Plan Note (Signed)
Continue present dose of Lipitor. He has not tolerated higher doses in the past. Lipids and liver followed by primary care.

## 2012-10-08 NOTE — Patient Instructions (Addendum)
Your physician wants you to follow-up in: ONE YEAR WITH DR CRENSHAW You will receive a reminder letter in the mail two months in advance. If you don't receive a letter, please call our office to schedule the follow-up appointment.  

## 2012-10-08 NOTE — Assessment & Plan Note (Signed)
Blood pressure controlled. Continue present medications. 

## 2012-10-09 ENCOUNTER — Encounter (HOSPITAL_COMMUNITY)
Admission: RE | Admit: 2012-10-09 | Discharge: 2012-10-09 | Disposition: A | Discharge status: Home / Self Care | Payer: 59 | Source: Ambulatory Visit | Attending: Internal Medicine | Admitting: Internal Medicine

## 2012-10-09 ENCOUNTER — Encounter (HOSPITAL_COMMUNITY): Payer: Self-pay

## 2012-10-09 LAB — HEMOGLOBIN: Hemoglobin: 13.9 g/dL (ref 13.0–17.0)

## 2012-10-09 NOTE — Procedures (Signed)
Hgb. 13.9 today 10/09/12,  drawn as ordered.  Tolerated well.

## 2012-10-10 ENCOUNTER — Other Ambulatory Visit: Payer: Self-pay

## 2012-10-10 NOTE — Progress Notes (Signed)
Quick Note:  Have him repeat phlebotomy in 2 months Check a ferritin then also ______

## 2012-11-05 ENCOUNTER — Other Ambulatory Visit: Payer: Self-pay | Admitting: Cardiology

## 2012-11-12 ENCOUNTER — Ambulatory Visit (INDEPENDENT_AMBULATORY_CARE_PROVIDER_SITE_OTHER): Payer: 59 | Admitting: Family

## 2012-11-12 ENCOUNTER — Encounter: Payer: Self-pay | Admitting: Family

## 2012-11-12 VITALS — BP 100/72 | HR 52 | Temp 97.9°F | Resp 15 | Ht 72.0 in | Wt 230.1 lb

## 2012-11-12 DIAGNOSIS — I10 Essential (primary) hypertension: Secondary | ICD-10-CM

## 2012-11-12 DIAGNOSIS — E785 Hyperlipidemia, unspecified: Secondary | ICD-10-CM

## 2012-11-12 NOTE — Assessment & Plan Note (Addendum)
BP Readings from Last 3 Encounters:  11/12/12 100/72  10/09/12 119/88  10/08/12 115/80   BP stable on current meds. Continue same.

## 2012-11-12 NOTE — Progress Notes (Signed)
Subjective:    Patient ID: Dewayne Shorter., male    DOB: [birth year], 52 y.o.   MRN: [id number]  HPI  Mr. Heide is a 52 yr old male who presents today for follow up of his HTN.    Hemochromatosis- he follows with Dr. Leone Payor who has been managing his phlebotomy every other month.   Hyperlipidemia-  He is currently managed with atorvastatin and fish oil.    HTN- Currently maintained on lisinopril.denies sob/cp or swelling.    Review of Systems  See HPI  Past Medical History  Diagnosis Date  . CAD (coronary artery disease)     s/p CABG 01/07  . Gilbert's syndrome     possible. (elevated bilirubin)  . Benign positional vertigo   . Hx of adenomatous colonic polyps     Medoff  . Hyperlipidemia   . Memory loss   . Hemochromatosis   . Allergy     seasonal  . Myocardial infarction 05/2005  . Internal hemorrhoid   . Diverticula of colon   . Tobacco abuse   . Vertigo   . Amenia     postoperative  . Hypertension     History   Social History  . Marital Status: Divorced    Spouse Name: N/A    Number of Children: N/A  . Years of Education: N/A   Occupational History  . Service Games developer   Social History Main Topics  . Smoking status: Former Smoker -- 1.00 packs/day for 36 years    Types: Cigarettes    Quit date: 03/07/2006  . Smokeless tobacco: Never Used     Comment: smoked since age 16 approx. 1 pact to 1 and a half packs a day. fortunately after his bypass surgery he completely discontinued smoking  . Alcohol Use: 2.4 oz/week    4 Cans of beer per week     Comment: socially  . Drug Use: No  . Sexually Active: Not on file   Other Topics Concern  . Not on file   Social History Narrative   Lives alone. Fishing is a hobby.     Past Surgical History  Procedure Laterality Date  . Coronary artery bypass graft    . Median sternotomy      for CABG x 4 (left internal mammary artery to distal left anterior descending coronary artery,  right internal mammary artery to first circumflex marginal branch, saphenous vein graft to posterior descending coronary artery, saphenous vein graft to diagonal branch, endoscopic saphenous vein harvest from right thigh.) SURGEON: Salvatore Decent. Cornelius Moras, M.D. CHO/MEDQ D:06/08/05. T: 06/09/2005 Job: [id number]  . Left knee arthroscopy    . Colonoscopy  2003, 2007, 02/2011    small adenomas  . Laparoscopic appendectomy N/A 08/06/2012    Procedure: APPENDECTOMY LAPAROSCOPIC;  Surgeon: [name]. [name], MD;  Location: [facility] OR;  Service: General;  Laterality: N/A;  . Appendectomy  2014    lap appy    Family History  Problem Relation Age of Onset  . Breast cancer Neg Hx   . Prostate cancer Neg Hx   . Colon polyps Mother   . Colon cancer Paternal Uncle     Allergies  Allergen Reactions  . Simvastatin     REACTION: Myalgias    Current Outpatient Prescriptions on File Prior to Visit  Medication Sig Dispense Refill  . aspirin 325 MG tablet Take 325 mg by mouth daily.      Marland Kitchen atorvastatin (LIPITOR) 10 MG  tablet Take 10 mg by mouth every 3 (three) days.       . fish oil-omega-3 fatty acids 1000 MG capsule Take 1 g by mouth 2 (two) times daily.        . fluticasone (FLONASE) 50 MCG/ACT nasal spray USE 2 SPRAYS INTO THE NOSE DAILY  16 g  1  . lansoprazole (PREVACID) 15 MG capsule Take 15 mg by mouth every other day.      . lisinopril (PRINIVIL,ZESTRIL) 10 MG tablet Take 5 mg by mouth daily.      Marland Kitchen loratadine (CLARITIN) 10 MG tablet Take 10 mg by mouth daily as needed. For allergies      . metoprolol succinate (TOPROL-XL) 50 MG 24 hr tablet TAKE 1 TABLET (50 MG TOTAL) BY MOUTH DAILY.  30 tablet  0   No current facility-administered medications on file prior to visit.    BP 100/72  Pulse 52  Temp(Src) 97.9 F (36.6 C) (Oral)  Resp 15  Ht 6' (1.829 m)  Wt 230 lb 1.9 oz (104.382 kg)  BMI 31.2 kg/m2  SpO2 99%       Objective:   Physical Exam  Constitutional: He is oriented to person, place, and  time. He appears well-developed and well-nourished. No distress.  Cardiovascular: Normal rate and regular rhythm.   No murmur heard. Pulmonary/Chest: Effort normal and breath sounds normal. No respiratory distress. He has no wheezes. He has no rales. He exhibits no tenderness.  Neurological: He is alert and oriented to person, place, and time.  Psychiatric: He has a normal mood and affect. His behavior is normal. Judgment and thought content normal.          Assessment & Plan:

## 2012-11-12 NOTE — Patient Instructions (Addendum)
Please schedule fasting physical in 3 months. Complete fasting cholesterol tomorrow at the [facility].

## 2012-11-13 ENCOUNTER — Ambulatory Visit: Payer: 59

## 2012-11-13 DIAGNOSIS — E785 Hyperlipidemia, unspecified: Secondary | ICD-10-CM

## 2012-11-13 LAB — LIPID PANEL: HDL: 36.3 mg/dL — ABNORMAL LOW (ref >39.00)

## 2012-11-13 LAB — LDL CHOLESTEROL, DIRECT: Direct LDL: 138.6 mg/dL

## 2012-11-13 NOTE — Assessment & Plan Note (Signed)
Tolerating statin. He will return for flp.

## 2012-11-13 NOTE — Assessment & Plan Note (Signed)
Managed by GI with phlebotomy.

## 2012-11-17 ENCOUNTER — Telehealth: Payer: Self-pay | Admitting: Family

## 2012-11-17 DIAGNOSIS — E785 Hyperlipidemia, unspecified: Secondary | ICD-10-CM

## 2012-11-17 MED ORDER — ATORVASTATIN CALCIUM 20 MG PO TABS: 20.0000 mg | ORAL_TABLET | Freq: Every day | ORAL | Status: DC

## 2012-11-17 NOTE — Telephone Encounter (Signed)
LDL remains above goal.  I would like him to increase his lipitor to 20mg  and repeat FLP/LFT in 2 weeks.  Continue fish oil, low fat/low cholesterol diet.

## 2012-11-18 ENCOUNTER — Other Ambulatory Visit: Payer: Self-pay | Admitting: Family

## 2012-11-18 MED ORDER — PITAVASTATIN CALCIUM 2 MG PO TABS: 2.0000 mg | ORAL_TABLET | Freq: Every day | ORAL | Status: DC

## 2012-11-18 NOTE — Telephone Encounter (Signed)
Pt reports that he cannot take the Lipitor d/t muscle pain; per provider VO, changed to Livalo 2 mg daily, samples available w/savings card & Rx to pharmacy; also return for FLP/LFT for 45-months out [not 2 wks]. Patient informed, understood & agreed; new Rx to pharmacy & future lab orders placed/SLS

## 2012-11-19 NOTE — Telephone Encounter (Signed)
Please make sure that pt has a copay card and activates.  If he does so, then hopefully will bring cost down to 17 dollars. If not, let me know.

## 2012-11-19 NOTE — Telephone Encounter (Signed)
COPAY IS $200/MONTH. PATIENT WOULD LIKE AN ALTERNATIVE. THANKS. BR 11/18/12

## 2012-11-19 NOTE — Telephone Encounter (Signed)
Left message for patient to return call concerning copay card.

## 2012-11-25 NOTE — Telephone Encounter (Signed)
Patient has not as of yet picked up Samples and/or Savings card left at front desk 06.30.14 after phone conversation: Regis Bill, CMA at 11/18/2012 3:01 PM   Status: Signed         Pt reports that he cannot take the Lipitor d/t muscle pain; per provider VO, changed to Livalo 2 mg daily, samples available w/savings card & Rx to pharmacy; also return for FLP/LFT for 38-months out [not 2 wks]. Patient informed, understood & agreed; new Rx to pharmacy & future lab orders placed/S

## 2012-11-28 NOTE — Telephone Encounter (Signed)
Spoke with patient again RE: Livalo and pt reports that Dr. Jens Som wanted him back on Atorvastatin, so he resumed taking medication and is continuing to have joint pain, worse in the morning, some days w/cramping "so bad he can hardly walk". Explained to patient that we want him to comply with his providers, but we don't want him to have to deal with those side effects either and he states that "if we can clear it with Cardiology to stop the statin that he will abide"; pt also reports that he has moved the dosing back from QOD to Every third day and has had some relie of side effects, but is going to try QOD again at Dr. Ludwig Clarks request/SLS Informed pt that information would be forwarded to PCP; please advise.

## 2012-12-01 NOTE — Telephone Encounter (Signed)
Spoke with pt. He told me that he is taking atorvastatin every third day.  Reports that he is tolerating this dose without myalgia.  Plans to try every other day and will contact us if he develops myalgia on this dose.

## 2012-12-03 ENCOUNTER — Other Ambulatory Visit: Payer: Self-pay | Admitting: Cardiology

## 2012-12-10 ENCOUNTER — Encounter (HOSPITAL_COMMUNITY): Payer: Self-pay

## 2012-12-10 ENCOUNTER — Encounter (HOSPITAL_COMMUNITY)
Admission: RE | Admit: 2012-12-10 | Discharge: 2012-12-10 | Disposition: A | Discharge status: Home / Self Care | Payer: 59 | Source: Ambulatory Visit | Attending: Internal Medicine | Admitting: Internal Medicine

## 2012-12-10 LAB — HEMATOCRIT: HCT: 40.8 % (ref 39.0–52.0)

## 2012-12-10 LAB — HEMOGLOBIN: Hemoglobin: 13.6 g/dL (ref 13.0–17.0)

## 2012-12-10 MED ORDER — SODIUM CHLORIDE 0.9 % IJ SOLN
10.0000 mL | INTRAMUSCULAR | Status: DC | PRN
  Administered 2012-12-10: 10 mL via INTRAVENOUS

## 2012-12-10 NOTE — Procedures (Signed)
Phlebotomized 250 ml of blood. Tolerated well. No signs of adverse reaction noted. Verbalizes understanding of discharge instructions. Instructed to call Dr. Leone Payor for problems and concerns once discharged.

## 2012-12-14 NOTE — Progress Notes (Signed)
Quick Note:  Continue current phlebotomy regimen ______

## 2013-02-03 ENCOUNTER — Other Ambulatory Visit: Payer: Self-pay

## 2013-02-04 ENCOUNTER — Encounter (HOSPITAL_COMMUNITY)
Admission: RE | Admit: 2013-02-04 | Discharge: 2013-02-04 | Disposition: A | Discharge status: Home / Self Care | Payer: 59 | Source: Ambulatory Visit | Attending: Internal Medicine | Admitting: Internal Medicine

## 2013-02-04 ENCOUNTER — Other Ambulatory Visit: Payer: Self-pay

## 2013-02-04 LAB — HEMOGLOBIN AND HEMATOCRIT, BLOOD
HCT: 43.6 % (ref 39.0–52.0)
Hemoglobin: 14.6 g/dL (ref 13.0–17.0)

## 2013-02-04 MED ORDER — SODIUM CHLORIDE 0.9 % IJ SOLN
10.0000 mL | INTRAMUSCULAR | Status: DC | PRN
  Administered 2013-02-04: 10 mL via INTRAVENOUS

## 2013-02-04 NOTE — Procedures (Signed)
250cc withdrawn. Patient tolerated well.

## 2013-02-04 NOTE — Discharge Instructions (Signed)

## 2013-02-06 ENCOUNTER — Other Ambulatory Visit: Payer: Self-pay

## 2013-02-06 NOTE — Progress Notes (Signed)
Quick Note:  Repeat same phlebotomy in 2 months Get a ferritin then please ______

## 2013-02-09 ENCOUNTER — Encounter: Payer: 59 | Admitting: Family

## 2013-02-16 ENCOUNTER — Encounter: Payer: 59 | Admitting: Family

## 2013-02-25 ENCOUNTER — Other Ambulatory Visit: Payer: Self-pay | Admitting: Cardiology

## 2013-03-26 ENCOUNTER — Other Ambulatory Visit: Payer: Self-pay

## 2013-04-01 ENCOUNTER — Encounter (HOSPITAL_COMMUNITY)
Admission: RE | Admit: 2013-04-01 | Discharge: 2013-04-01 | Disposition: A | Discharge status: Home / Self Care | Payer: 59 | Source: Ambulatory Visit | Attending: Internal Medicine | Admitting: Internal Medicine

## 2013-04-01 ENCOUNTER — Encounter (HOSPITAL_COMMUNITY): Payer: Self-pay

## 2013-04-01 LAB — FERRITIN: Ferritin: 20 ng/mL — ABNORMAL LOW (ref 22–322)

## 2013-04-01 LAB — HEMOGLOBIN AND HEMATOCRIT, BLOOD: Hemoglobin: 14.3 g/dL (ref 13.0–17.0)

## 2013-04-01 MED ORDER — SODIUM CHLORIDE 0.9 % IJ SOLN
10.0000 mL | INTRAMUSCULAR | Status: DC | PRN
  Administered 2013-04-01: 10 mL via INTRAVENOUS

## 2013-04-01 NOTE — Procedures (Signed)
250 gm phlebotomy performed. Patient became light headed for a moment during procedure, but passed quickly.

## 2013-04-08 ENCOUNTER — Encounter: Payer: Self-pay | Admitting: Internal Medicine

## 2013-04-08 NOTE — Progress Notes (Signed)
Quick Note:  Continue current phlebotomy regimen - wait until I ask for a ferritin again to order ______

## 2013-05-29 ENCOUNTER — Other Ambulatory Visit: Payer: Self-pay

## 2013-06-03 ENCOUNTER — Encounter (HOSPITAL_COMMUNITY): Payer: Self-pay

## 2013-06-03 ENCOUNTER — Encounter (HOSPITAL_COMMUNITY)
Admission: RE | Admit: 2013-06-03 | Discharge: 2013-06-03 | Disposition: A | Discharge status: Home / Self Care | Payer: 59 | Source: Ambulatory Visit | Attending: Internal Medicine | Admitting: Internal Medicine

## 2013-06-03 NOTE — Procedures (Signed)
Confirmed with Barb Merino that pt does not need lab work prior to this phlebotomy and he is to be scheduled for next one in 8 weeks.. 250cc blood right a/c with 20 ga x 1" over 10 minutes. Pt tolerated this well.

## 2013-06-03 NOTE — Discharge Instructions (Signed)

## 2013-06-22 IMAGING — CT CT ABD-PELV W/O CM
2 of 4 series · 16 of 46 positions shown, 18 images · non-contrast
Comparison: None

CLINICAL DATA: 51-year-old male with right-sided abdominal and
pelvic pain.

CT ABDOMEN AND PELVIS WITHOUT CONTRAST
TECHNIQUE: Multidetector CT imaging of the abdomen and pelvis was
performed following the standard protocol without intravenous
contrast.

[Series 2: abd/pelvis 5.0 b31f · axial · 0.78mm/px · z∈[-510,-50]mm · 13 of 102 slices shown, 15 images]
[im 5/102  soft-tissue]
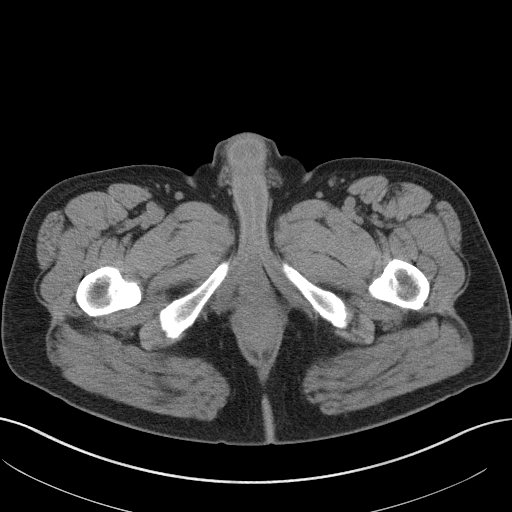
[im 5/102  bone]
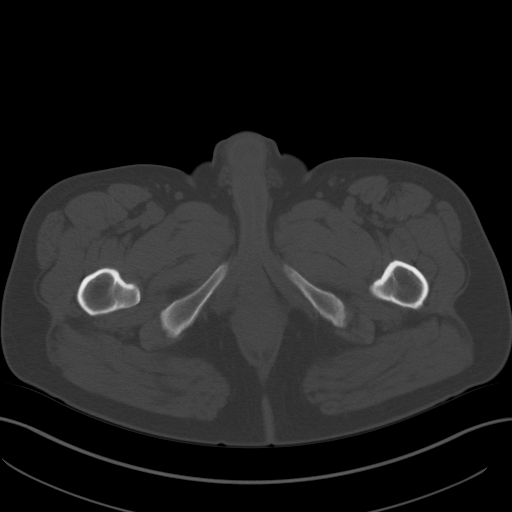
[im 13/102  soft-tissue]
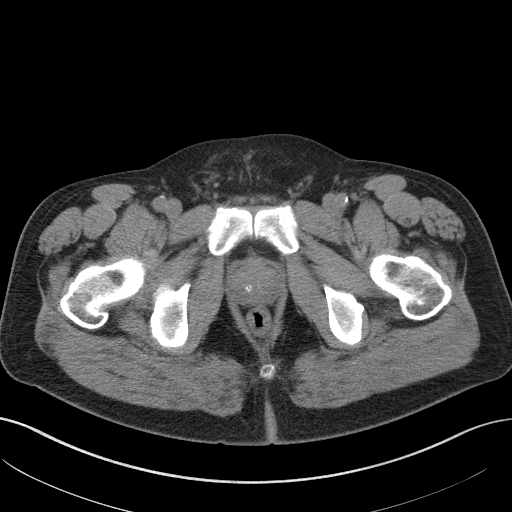
[im 21/102  soft-tissue]
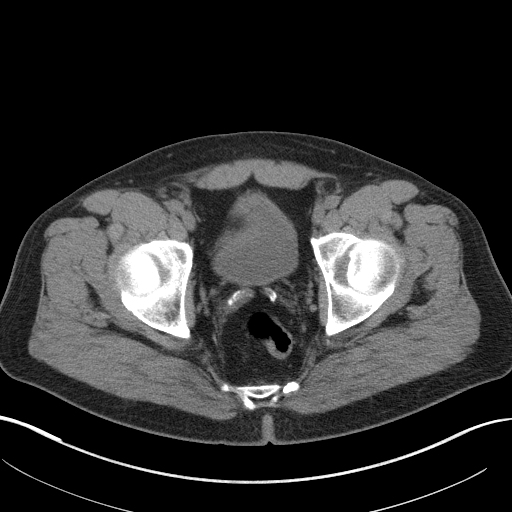
[im 29/102  soft-tissue]
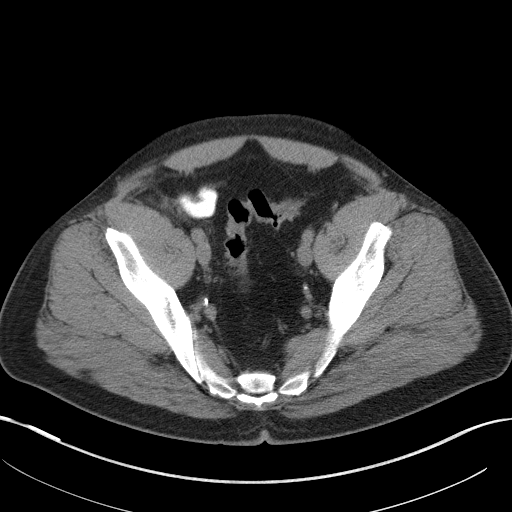
[im 37/102  soft-tissue]
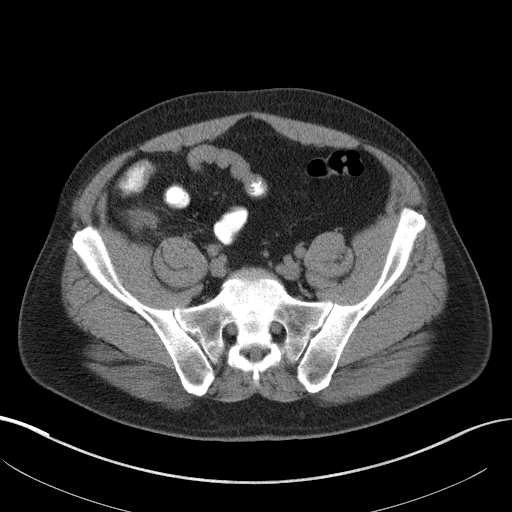
[im 45/102  soft-tissue]
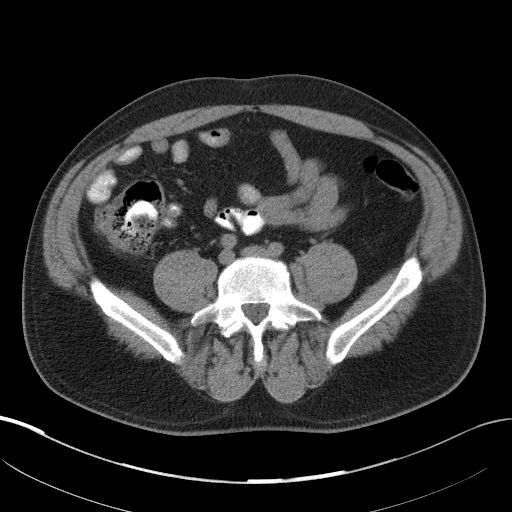
[im 53/102  soft-tissue]
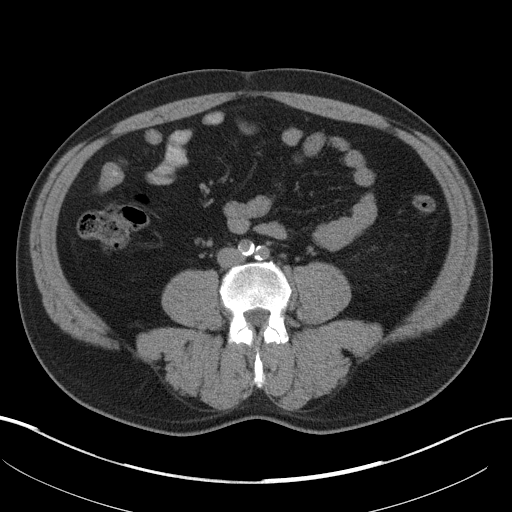
[im 57/102  soft-tissue]
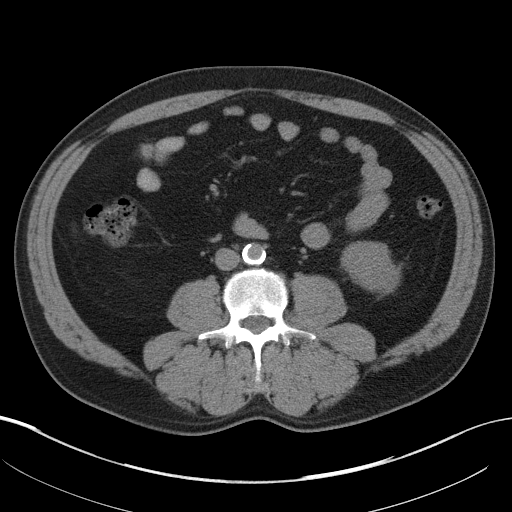
[im 65/102  soft-tissue]
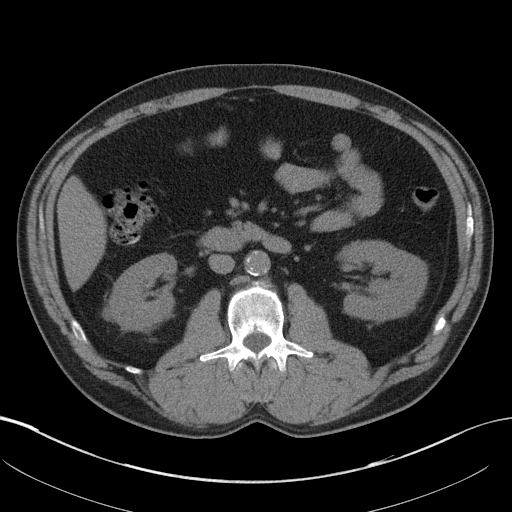
[im 65/102  bone]
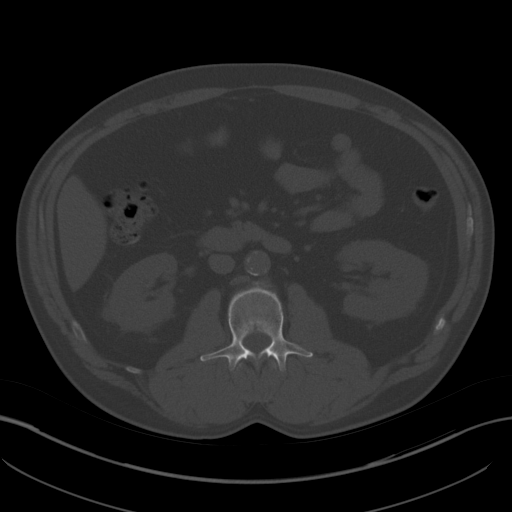
[im 73/102  soft-tissue]
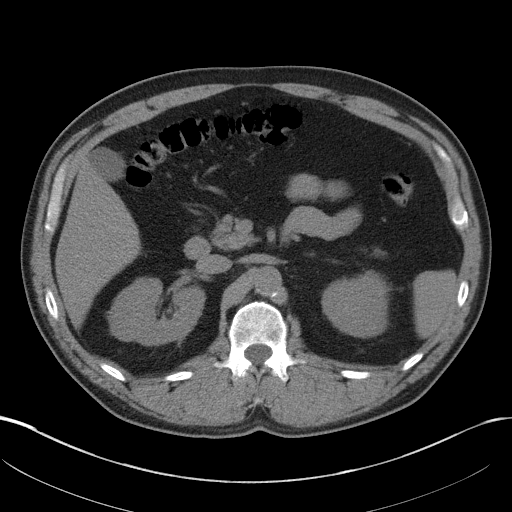
[im 81/102  soft-tissue]
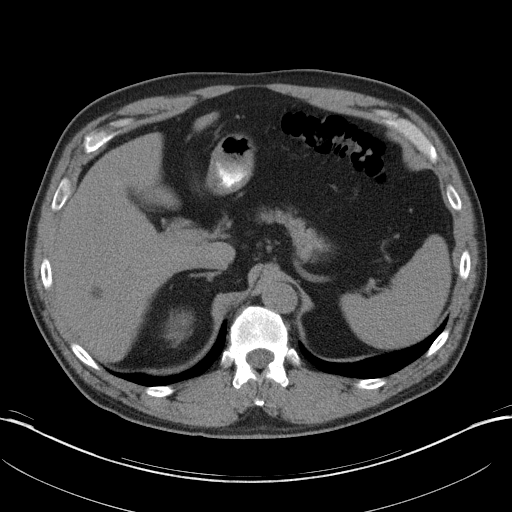
[im 89/102  soft-tissue]
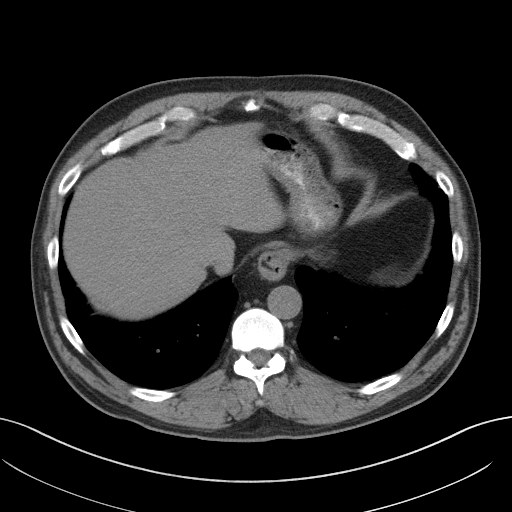
[im 97/102  soft-tissue]
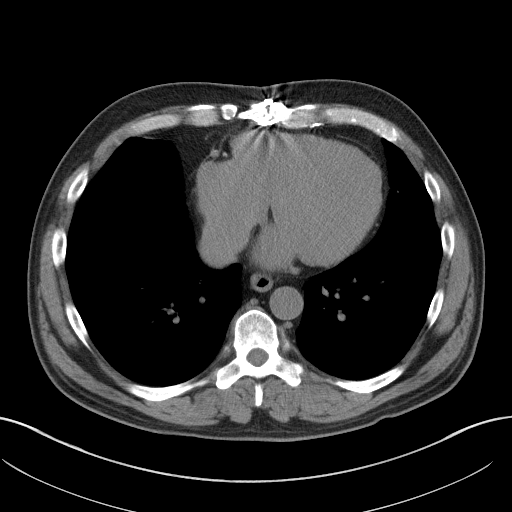

[Series 4: abd/pelvis 3.0 coronal · coronal · 0.77mm/px · 3 of 95 slices shown]
[im 32/95  soft-tissue]
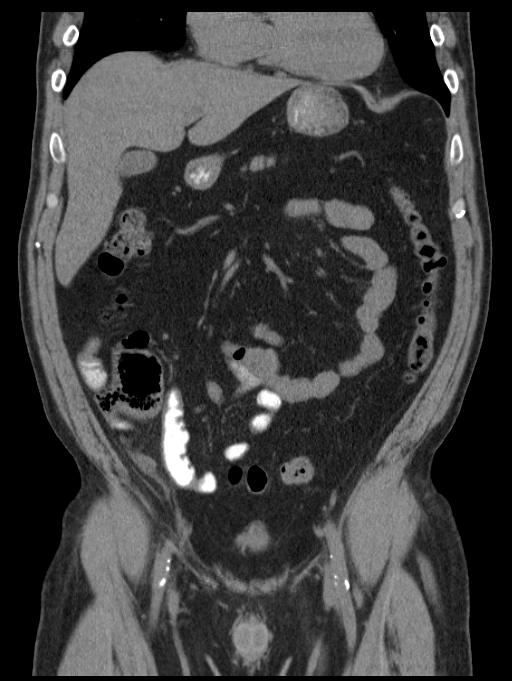
[im 42/95  soft-tissue]
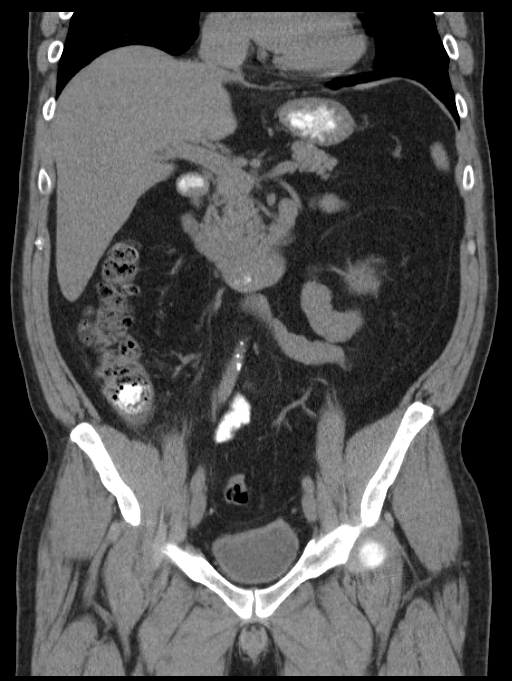
[im 53/95  soft-tissue]
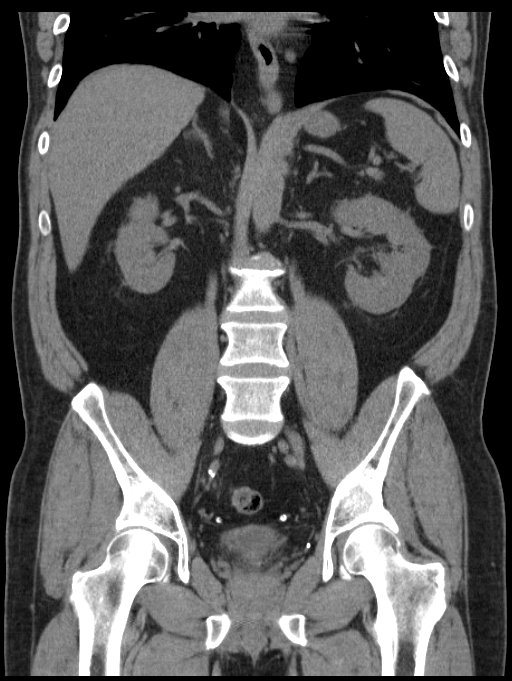

[16 of 46 positions shown; findings below may reference images not displayed]

FINDINGS: The appendix is enlarged with adjacent inflammation
compatible with appendicitis.  There is no evidence of free fluid,
abscess or pneumoperitoneum.

A hepatic cyst is noted but the remainder of the liver is
unremarkable.
Cholelithiasis identified without CT evidence of acute
cholecystitis.
The spleen, adrenal glands, kidneys and pancreas are unremarkable.

Please note that parenchymal abnormalities may be missed as
intravenous contrast was not administered.

No free fluid, enlarged lymph nodes, biliary dilation or abdominal
aortic aneurysm identified.

No other bowel abnormalities are identified.
The bladder is within normal limits.
No acute or suspicious bony abnormalities are present.
IMPRESSION: Appendicitis without evidence of rupture.

Cholelithiasis without CT evidence of acute cholecystitis.

These results were called to Oeteletse Bolotsang on 08/06/2012 at
[DATE] p.m..

## 2013-06-24 ENCOUNTER — Telehealth: Payer: Self-pay | Admitting: Internal Medicine

## 2013-06-24 NOTE — Telephone Encounter (Signed)
Patient calling to report reflux that is not helped by Prevacid or Pepcid. States he ate a small bite of chicken today and it hurt. States this started about a year ago but has gotten much worse int he last few weeks. Scheduled with Dr. Carlean Purl on 06/26/13 at 3:45 PM.

## 2013-06-26 ENCOUNTER — Encounter: Payer: Self-pay | Admitting: Internal Medicine

## 2013-06-26 ENCOUNTER — Ambulatory Visit (INDEPENDENT_AMBULATORY_CARE_PROVIDER_SITE_OTHER): Payer: 59 | Admitting: Internal Medicine

## 2013-06-26 VITALS — BP 110/70 | HR 57 | Ht 71.5 in | Wt 232.0 lb

## 2013-06-26 DIAGNOSIS — R1110 Vomiting, unspecified: Secondary | ICD-10-CM

## 2013-06-26 DIAGNOSIS — IMO0001 Reserved for inherently not codable concepts without codable children: Secondary | ICD-10-CM

## 2013-06-26 DIAGNOSIS — R079 Chest pain, unspecified: Secondary | ICD-10-CM

## 2013-06-26 MED ORDER — PANTOPRAZOLE SODIUM 40 MG PO TBEC: 40.0000 mg | DELAYED_RELEASE_TABLET | Freq: Every day | ORAL | Status: DC

## 2013-06-26 NOTE — Progress Notes (Signed)
Subjective:    Patient ID: Dylan Hudson, male    DOB: 1961/03/25, 53 y.o.   MRN: [id number]  HPI Weinreb is here with a several month history of intermittent indigestion and regurgitation problem. It started intermittently last year, when he or after eating food. He does not describe dysphagia but he has had episodes were he has regurgitated partially digested food during a meal. He has some chest pressure in the lower chest There is no nausea or vomiting. There is no classic heartburn but he tends to belch and regurgitate. He tried over-the-counter Prevacid did seem to help a little bit but stopped helping, he has tried over-the-counter Nexium which has worked somewhat better. He has not started any new medications. At one point he seems to type of fish oil he was taking but changed back to the prior type and there has been no change in the symptoms. Wt Readings from Last 3 Encounters:  06/26/13 232 lb (105.235 kg)  11/12/12 230 lb 1.9 oz (104.382 kg)  10/08/12 228 lb (103.42 kg)   Allergies  Allergen Reactions  . Simvastatin     REACTION: Myalgias   Outpatient Prescriptions Prior to Visit  Medication Sig Dispense Refill  . aspirin 325 MG tablet Take 325 mg by mouth daily.      Marland Kitchen atorvastatin (LIPITOR) 10 MG tablet Take 10 mg by mouth every other day.      . fish oil-omega-3 fatty acids 1000 MG capsule Take 1 g by mouth 2 (two) times daily.        . fluticasone (FLONASE) 50 MCG/ACT nasal spray USE 2 SPRAYS INTO THE NOSE DAILY  16 g  1  . lisinopril (PRINIVIL,ZESTRIL) 10 MG tablet TAKE 1/2 TABLETS (5 MG TOTAL) BY MOUTH DAILY.  30 tablet  2  . loratadine (CLARITIN) 10 MG tablet Take 10 mg by mouth daily as needed. For allergies      . metoprolol succinate (TOPROL-XL) 50 MG 24 hr tablet TAKE 1 TABLET (50 MG TOTAL) BY MOUTH DAILY.  30 tablet  5  . lansoprazole (PREVACID) 15 MG capsule Take 15 mg by mouth every other day.      . lisinopril (PRINIVIL,ZESTRIL) 10 MG tablet Take 5 mg  by mouth daily.       No facility-administered medications prior to visit.   Past Medical History  Diagnosis Date  . CAD (coronary artery disease)     s/p CABG 01/07  . Gilbert's syndrome     possible. (elevated bilirubin)  . Benign positional vertigo   . Hx of adenomatous colonic polyps     Medoff  . Hyperlipidemia   . Memory loss   . Hemochromatosis   . Allergy     seasonal  . Myocardial infarction 05/2005  . Internal hemorrhoid   . Diverticula of colon   . Tobacco abuse   . Vertigo   . Amenia     postoperative  . Hypertension    Past Surgical History  Procedure Laterality Date  . Coronary artery bypass graft    . Median sternotomy      for CABG x 4 (left internal mammary artery to distal left anterior descending coronary artery, right internal mammary artery to first circumflex marginal branch, saphenous vein graft to posterior descending coronary artery, saphenous vein graft to diagonal branch, endoscopic saphenous vein harvest from right thigh.) SURGEON: Valentina Gu. Roxy Manns, M.D. CHO/MEDQ D:06/08/05. T: 06/09/2005 Job: [id number]  . Left knee arthroscopy    .  Colonoscopy  2003, 2007, 02/2011    small adenomas  . Laparoscopic appendectomy N/A 08/06/2012    Procedure: APPENDECTOMY LAPAROSCOPIC;  Surgeon: Imogene Burn. Georgette Dover, MD;  Location: [facility];  Service: General;  Laterality: N/A;  . Appendectomy  2014    lap appy   History   Social History  . Marital Status: Divorced    Spouse Name: N/A    Number of Children: N/A  . Years of Education: N/A   Occupational History  . Service Occupational psychologist   Social History Main Topics  . Smoking status: Former Smoker -- 1.00 packs/day for 36 years    Types: Cigarettes    Quit date: 03/07/2006  . Smokeless tobacco: Never Used     Comment: smoked since age 89 approx. 1 pact to 1 and a half packs a day. fortunately after his bypass surgery he completely discontinued smoking  . Alcohol Use: 2.4 oz/week    4 Cans of beer  per week     Comment: socially  . Drug Use: No  . Sexual Activity: None   Other Topics Concern  . None   Social History Narrative   Lives alone. Fishing is a hobby.    Family History  Problem Relation Age of Onset  . Breast cancer Neg Hx   . Prostate cancer Neg Hx   . Colon polyps Mother   . Colon cancer Paternal Uncle    Review of Systems As above    Objective:   Physical Exam General:  NAD Eyes:   anicteric Lungs:  clear Heart:  S1S2 no rubs, murmurs or gallops Abdomen:  soft and nontender, BS+   Data Reviewed:  He has not had a previous EGD        Assessment & Plan:   1. Regurgitation   2. Chest pain    1. This sounds most like GERD. I wonder if these regurgitation episodes of food could actually be a manifestation of dysphagia that is somewhat atypical. 2. Will start pantoprazole 40 mg daily and stop over-the-counter PPI. 3. Schedule for upper GI endoscopy with possible esophageal dilation.The risks and benefits as well as alternatives of endoscopic procedure(s) have been discussed and reviewed. All questions answered. The patient agrees to proceed. 4. Should his symptoms completely resolve on a PPI I advised him to call me and let me know he might cancel the upper endoscopy.

## 2013-06-26 NOTE — Patient Instructions (Signed)
You have been scheduled for an endoscopy with propofol. Please follow written instructions given to you at your visit today. If you use inhalers (even only as needed), please bring them with you on the day of your procedure. Your physician has requested that you go to www.startemmi.com and enter the access code given to you at your visit today. This web site gives a general overview about your procedure. However, you should still follow specific instructions given to you by our office regarding your preparation for the procedure.  We have sent the following medications to your pharmacy for you to pick up at your convenience: pantoprazole    I appreciate the opportunity to care for you.

## 2013-07-02 ENCOUNTER — Encounter: Payer: Self-pay | Admitting: Internal Medicine

## 2013-07-28 ENCOUNTER — Other Ambulatory Visit: Payer: Self-pay | Admitting: Family

## 2013-07-29 ENCOUNTER — Encounter (HOSPITAL_COMMUNITY)
Admission: RE | Admit: 2013-07-29 | Discharge: 2013-07-29 | Disposition: A | Discharge status: Home / Self Care | Payer: 59 | Source: Ambulatory Visit | Attending: Internal Medicine | Admitting: Internal Medicine

## 2013-07-29 ENCOUNTER — Encounter (HOSPITAL_COMMUNITY): Payer: Self-pay

## 2013-07-29 NOTE — Telephone Encounter (Signed)
30 day supply atorvastatin sent to pharmacy. Pt is past due for follow up. Pt last seen 11/12/12 and advised physical in 3 months.  Pt will need to be seen before further refills can be given.

## 2013-07-29 NOTE — Discharge Instructions (Signed)
Therapeutic Phlebotomy Therapeutic phlebotomy is the controlled removal of blood from your body for the purpose of treating a medical condition. It is similar to donating blood. Usually, about a pint (470 mL) of blood is removed. The average adult has 9 to 12 pints (4.3 to 5.7 L) of blood. Therapeutic phlebotomy may be used to treat the following medical conditions:  Hemochromatosis. This is a condition in which there is too much iron in the blood.  Polycythemia vera. This is a condition in which there are too many red cells in the blood.  Porphyria cutanea tarda. This is a disease usually passed from one generation to the next (inherited). It is a condition in which an important part of hemoglobin is not made properly. This results in the build up of abnormal amounts of porphyrins in the body.  Sickle cell disease. This is an inherited disease. It is a condition in which the red blood cells form an abnormal crescent shape rather than a round shape. LET YOUR CAREGIVER KNOW ABOUT:  Allergies.  Medicines taken including herbs, eyedrops, over-the-counter medicines, and creams.  Use of steroids (by mouth or creams).  Previous problems with anesthetics or numbing medicine.  History of blood clots.  History of bleeding or blood problems.  Previous surgery.  Possibility of pregnancy, if this applies. RISKS AND COMPLICATIONS This is a simple and safe procedure. Problems are unlikely. However, problems can occur and may include:  Nausea or lightheadedness.  Low blood pressure.  Soreness, bleeding, swelling, or bruising at the needle insertion site.  Infection. BEFORE THE PROCEDURE  This is a procedure that can be done as an outpatient. Confirm the time that you need to arrive for your procedure. Confirm whether there is a need to fast or withhold any medications. It is helpful to wear clothing with sleeves that can be raised above the elbow. A blood sample may be done to determine the  amount of red blood cells or iron in your blood. Plan ahead of time to have someone drive you home after the procedure. PROCEDURE The entire procedure from preparation through recovery takes about 1 hour. The actual collection takes about 10 to 15 minutes.  A needle will be inserted into your vein.  Tubing and a collection bag will be attached to that needle.  Blood will flow through the needle and tubing into the collection bag.  You may be asked to open and close your hand slowly and continuously during the entire collection.  Once the specified amount of blood has been removed from your body, the collection bag and tubing will be clamped.  The needle will be removed.  Pressure will be held on the site of the needle insertion to stop the bleeding. Then a bandage will be placed over the needle insertion site. AFTER THE PROCEDURE  Your recovery will be assessed and monitored. If there are no problems, as an outpatient, you should be able to go home shortly after the procedure.  Document Released: 10/09/2010 Document Revised: 07/30/2011 Document Reviewed: 10/09/2010 [facility] Patient Information 2014 [place], [place]. IF YOU ARE GOING TO BE 15 OR MINUTES LATE FOR YOUR APPOINTMENT, PLEASE CALL [phone] TO MAKE OTHER ARRANGEMENTS FOR YOUR TREATMENT  IF YOU ARRIVE EARLY FOR YOUR SCHEDULED APPOINTMENT , YOU MAY HAVE TO WAIT UNTIL YOUR SCHEDULED TIME.

## 2013-07-29 NOTE — Procedures (Signed)
Therapeutic phlebotomy performed per order, 234ml of blood removed per order, pt. Tolerated well.

## 2013-07-29 NOTE — Telephone Encounter (Signed)
cpe scheduled for 08/14/13

## 2013-08-03 ENCOUNTER — Encounter: Payer: Self-pay | Admitting: Internal Medicine

## 2013-08-03 ENCOUNTER — Ambulatory Visit (AMBULATORY_SURGERY_CENTER): Payer: 59 | Admitting: Internal Medicine

## 2013-08-03 VITALS — BP 100/68 | HR 47 | Temp 96.5°F | Resp 16 | Ht 71.0 in | Wt 232.0 lb

## 2013-08-03 DIAGNOSIS — R079 Chest pain, unspecified: Secondary | ICD-10-CM

## 2013-08-03 DIAGNOSIS — K222 Esophageal obstruction: Secondary | ICD-10-CM

## 2013-08-03 DIAGNOSIS — R1110 Vomiting, unspecified: Secondary | ICD-10-CM

## 2013-08-03 DIAGNOSIS — IMO0001 Reserved for inherently not codable concepts without codable children: Secondary | ICD-10-CM

## 2013-08-03 HISTORY — DX: Esophageal obstruction: K22.2

## 2013-08-03 MED ORDER — PANTOPRAZOLE SODIUM 40 MG PO TBEC: 40.0000 mg | DELAYED_RELEASE_TABLET | Freq: Every day | ORAL | Status: DC

## 2013-08-03 MED ORDER — SODIUM CHLORIDE 0.9 % IV SOLN: 500.0000 mL | INTRAVENOUS | Status: DC

## 2013-08-03 NOTE — Patient Instructions (Addendum)
I found a stricture (narrow area of scar tissue) where the esophagus and stomach meet - I dilated this. I believe this will fix your problems but you need to stay on the pantoprazole.  If you are still having problems in May or they come back please call me.  I appreciate the opportunity to care for you. Gatha Mayer, MD, FACG   FOLLOW DILATATION DIET GIVEN TO YOU TODAY ,LIQUIDS UNTIL 5PM THEN SOFT FOODS THE REST OF TODAY  YOU HAD AN ENDOSCOPIC PROCEDURE TODAY AT [facility]: Refer to the procedure report that was given to you for any specific questions about what was found during the examination.  If the procedure report does not answer your questions, please call your gastroenterologist to clarify.  If you requested that your care partner not be given the details of your procedure findings, then the procedure report has been included in a sealed envelope for you to review at your convenience later.  YOU SHOULD EXPECT: Some feelings of bloating in the abdomen. Passage of more gas than usual.  Walking can help get rid of the air that was put into your GI tract during the procedure and reduce the bloating. If you had a lower endoscopy (such as a colonoscopy or flexible sigmoidoscopy) you may notice spotting of blood in your stool or on the toilet paper. If you underwent a bowel prep for your procedure, then you may not have a normal bowel movement for a few days.  DIET: FOLLOW DILATATION DIET    ACTIVITY: Your care partner should take you home directly after the procedure.  You should plan to take it easy, moving slowly for the rest of the day.  You can resume normal activity the day after the procedure however you should NOT DRIVE or use heavy machinery for 24 hours (because of the sedation medicines used during the test).    SYMPTOMS TO REPORT IMMEDIATELY: A gastroenterologist can be reached at any hour.  During normal business hours, 8:30 AM to 5:00 PM Monday through  Friday, call [phone].  After hours and on weekends, please call the GI answering service at [phone] who will take a message and have the physician on call contact you.     Following upper endoscopy (EGD)  Vomiting of blood or coffee ground material  New chest pain or pain under the shoulder blades  Painful or persistently difficult swallowing  New shortness of breath  Fever of 100F or higher  Black, tarry-looking stools  FOLLOW UP: If any biopsies were taken you will be contacted by phone or by letter within the next 1-3 weeks.  Call your gastroenterologist if you have not heard about the biopsies in 3 weeks.  Our staff will call the home number listed on your records the next business day following your procedure to check on you and address any questions or concerns that you may have at that time regarding the information given to you following your procedure. This is a courtesy call and so if there is no answer at the home number and we have not heard from you through the emergency physician on call, we will assume that you have returned to your regular daily activities without incident.  SIGNATURES/CONFIDENTIALITY: You and/or your care partner have signed paperwork which will be entered into your electronic medical record.  These signatures attest to the fact that that the information above on your After Visit Summary has been reviewed and is understood.  Full responsibility of the confidentiality of this discharge information lies with you and/or your care-partner.

## 2013-08-03 NOTE — Progress Notes (Signed)
Procedure ends, to recovery, report given and VSS. 

## 2013-08-03 NOTE — Op Note (Signed)
[place]  [address] [place], [address]   ENDOSCOPY PROCEDURE REPORT  PATIENT: Muscab, Brenneman  MR#: #[id number] BIRTHDATE: [birth year] , 77  yrs. old GENDER: Male ENDOSCOPIST: Gatha Mayer, MD, [facility] PROCEDURE DATE:  08/03/2013 PROCEDURE:  EGD, and balloon dilation < 30 mm ASA CLASS:     Class II INDICATIONS:  regurgitation, ? dysphagia MEDICATIONS: Propofol (Diprivan) 340 mg IV, MAC sedation, administered by CRNA, and These medications were titrated to patient response per physician's verbal order TOPICAL ANESTHETIC: none  DESCRIPTION OF PROCEDURE: After the risks benefits and alternatives of the procedure were thoroughly explained, informed consent was obtained.  The LB [id number] endoscope was introduced through the mouth and advanced to the second portion of the duodenum. Without limitations.  The instrument was slowly withdrawn as the mucosa was fully examined.    ESOPHAGUS: A stricture was found at the gastroesophageal junction. The stenosis was traversable with the endoscope.   It was dilated with a balloon, dilation effects at 19 and 20 mm, slight heme.  The remainder of the upper endoscopy exam was otherwise normal. Retroflexed views revealed no abnormalities.     The scope was then withdrawn from the patient and the procedure completed.  COMPLICATIONS: There were no complications. ENDOSCOPIC IMPRESSION: 1.   Stricture was found at the gastroesophageal junction and dilated to 20 mm 2.   The remainder of the upper endoscopy exam was otherwise normal  RECOMMENDATIONS: Clear liquids until 5 PM , then soft foods rest of day.  Resume prior diet tomorrow. If still having regurgitation/swallowing problems in May call back Stay on PPI   eSigned:  Gatha Mayer, MD, [facility] 08/03/2013 3:57 PM   CC:The Patient

## 2013-08-03 NOTE — Progress Notes (Signed)
Called to room to assist during endoscopic procedure.  Patient ID and intended procedure confirmed with present staff. Received instructions for my participation in the procedure from the performing physician.  

## 2013-08-04 ENCOUNTER — Telehealth: Payer: Self-pay | Admitting: *Deleted

## 2013-08-04 NOTE — Telephone Encounter (Signed)
Message left

## 2013-08-14 ENCOUNTER — Encounter: Payer: Self-pay | Admitting: Family

## 2013-08-14 ENCOUNTER — Ambulatory Visit (INDEPENDENT_AMBULATORY_CARE_PROVIDER_SITE_OTHER): Payer: 59 | Admitting: Family

## 2013-08-14 ENCOUNTER — Other Ambulatory Visit: Payer: Self-pay | Admitting: Family

## 2013-08-14 VITALS — BP 130/90 | HR 52 | Temp 97.4°F | Resp 18 | Ht 71.0 in | Wt 230.1 lb

## 2013-08-14 DIAGNOSIS — Z23 Encounter for immunization: Secondary | ICD-10-CM

## 2013-08-14 DIAGNOSIS — M791 Myalgia, unspecified site: Secondary | ICD-10-CM

## 2013-08-14 DIAGNOSIS — E785 Hyperlipidemia, unspecified: Secondary | ICD-10-CM

## 2013-08-14 DIAGNOSIS — Z0000 Encounter for general adult medical examination without abnormal findings: Secondary | ICD-10-CM

## 2013-08-14 LAB — LIPID PANEL
Cholesterol: 154 mg/dL (ref 0–200)
HDL: 31 mg/dL — ABNORMAL LOW (ref >39)
LDL CALC: 98 mg/dL (ref 0–99)
Total CHOL/HDL Ratio: 5 Ratio
Triglycerides: 123 mg/dL (ref <150)
VLDL: 25 mg/dL (ref 0–40)

## 2013-08-14 LAB — CBC WITH DIFFERENTIAL/PLATELET
Basophils Absolute: 0.1 10*3/uL (ref 0.0–0.1)
Basophils Relative: 1 % (ref 0–1)
Eosinophils Absolute: 0.1 10*3/uL (ref 0.0–0.7)
Eosinophils Relative: 2 % (ref 0–5)
HEMATOCRIT: 42.1 % (ref 39.0–52.0)
HEMOGLOBIN: 14.2 g/dL (ref 13.0–17.0)
LYMPHS ABS: 1.6 10*3/uL (ref 0.7–4.0)
LYMPHS PCT: 30 % (ref 12–46)
MCH: 27.6 pg (ref 26.0–34.0)
MCHC: 33.7 g/dL (ref 30.0–36.0)
MCV: 81.9 fL (ref 78.0–100.0)
MONO ABS: 0.6 10*3/uL (ref 0.1–1.0)
Monocytes Relative: 12 % (ref 3–12)
Neutro Abs: 2.9 10*3/uL (ref 1.7–7.7)
Neutrophils Relative %: 55 % (ref 43–77)
Platelets: 216 10*3/uL (ref 150–400)
RBC: 5.14 MIL/uL (ref 4.22–5.81)
RDW: 14.6 % (ref 11.5–15.5)
WBC: 5.2 10*3/uL (ref 4.0–10.5)

## 2013-08-14 LAB — BASIC METABOLIC PANEL WITH GFR
BUN: 13 mg/dL (ref 6–23)
CHLORIDE: 102 meq/L (ref 96–112)
CO2: 28 mEq/L (ref 19–32)
Calcium: 9.5 mg/dL (ref 8.4–10.5)
Creat: 1.01 mg/dL (ref 0.50–1.35)
GFR, Est African American: >89 mL/min
GFR, Est Non African American: 84 mL/min
Glucose, Bld: 86 mg/dL (ref 70–99)
POTASSIUM: 4.5 meq/L (ref 3.5–5.3)
Sodium: 138 mEq/L (ref 135–145)

## 2013-08-14 LAB — CK: Total CK: 71 U/L (ref 7–232)

## 2013-08-14 LAB — HEPATIC FUNCTION PANEL
ALT: 46 U/L (ref 0–53)
AST: 29 U/L (ref 0–37)
Albumin: 4.4 g/dL (ref 3.5–5.2)
Alkaline Phosphatase: 54 U/L (ref 39–117)
BILIRUBIN DIRECT: 0.5 mg/dL — AB (ref 0.0–0.3)
BILIRUBIN INDIRECT: 1.3 mg/dL — AB (ref 0.2–1.2)
Total Bilirubin: 1.8 mg/dL — ABNORMAL HIGH (ref 0.2–1.2)
Total Protein: 7.1 g/dL (ref 6.0–8.3)

## 2013-08-14 MED ORDER — PITAVASTATIN CALCIUM 2 MG PO TABS: 1.0000 | ORAL_TABLET | Freq: Every day | ORAL | Status: DC

## 2013-08-14 NOTE — Progress Notes (Signed)
Pre visit review using our clinic review tool, if applicable. No additional management support is needed unless otherwise documented below in the visit note. 

## 2013-08-14 NOTE — Assessment & Plan Note (Addendum)
Check FLP, trial of livalo 2mg  samples (#14 given). Gave copay card. Check CK level today.  Add [id number] to see if this helps with myalgias.

## 2013-08-14 NOTE — Assessment & Plan Note (Signed)
Discussed healthy diet, exercise, weight loss.  Obtain fasting lab work.  Pneumovax booster given today.

## 2013-08-14 NOTE — Progress Notes (Addendum)
Subjective:    Patient ID: Dylan Hudson, male    DOB: 1961/04/12, 53 y.o.   MRN: [id number]  HPI  Dylan Hudson is a 53 yr old male who presents today for cpx.  Patient presents today for complete physical.  Immunizations: up to date Diet: Reports healthy diet Exercise: 35 minutes a day 5-6 days a week on elliptical Colonoscopy:up to date Body mass index is 32.1 kg/(m^2).  Hyperlipidemia- reports intolerance to all statins that he tries. Has myalgia and muscle fatigue.  Only able to tolerate atorvastatin every other day.  In the past he has tried the following meds: Crestor, simvastatin, atorvastatin, pravastatin.  He has experienced myalagia with each of these medications.    Review of Systems  Constitutional: Negative for unexpected weight change.  HENT: Positive for postnasal drip. Negative for dental problem and hearing loss.   Eyes: Negative for visual disturbance.  Respiratory: Negative for cough and shortness of breath.   Cardiovascular: Negative for chest pain and leg swelling.  Gastrointestinal: Negative for nausea, vomiting and diarrhea.       Occasional blood with wiping due to hemorrhoid- "Dr. Carlean Purl is aware."   Genitourinary: Negative for dysuria and frequency.  Musculoskeletal: Positive for myalgias. Negative for arthralgias.  Skin: Negative for rash.  Neurological: Negative for headaches.  Hematological: Negative for adenopathy.  Psychiatric/Behavioral:       Denies depression/anxiety   Past Medical History  Diagnosis Date  . CAD (coronary artery disease)     s/p CABG 01/07  . Gilbert's syndrome     possible. (elevated bilirubin)  . Benign positional vertigo   . Hx of adenomatous colonic polyps     Medoff  . Hyperlipidemia   . Memory loss   . Hemochromatosis   . Allergy     seasonal  . Myocardial infarction 05/2005  . Internal hemorrhoid   . Diverticula of colon   . Tobacco abuse   . Vertigo   . Amenia     postoperative  . Hypertension   .  Esophageal stricture 08/03/2013    History   Social History  . Marital Status: Divorced    Spouse Name: N/A    Number of Children: N/A  . Years of Education: N/A   Occupational History  . Service Occupational psychologist   Social History Main Topics  . Smoking status: Former Smoker -- 1.00 packs/day for 36 years    Types: Cigarettes    Quit date: 03/07/2006  . Smokeless tobacco: Never Used     Comment: smoked since age 76 approx. 1 pact to 1 and a half packs a day. fortunately after his bypass surgery he completely discontinued smoking  . Alcohol Use: 2.4 oz/week    4 Cans of beer per week     Comment: socially  . Drug Use: No  . Sexual Activity: Not on file   Other Topics Concern  . Not on file   Social History Narrative   Lives with girlfriend. Fishing is a hobby.    Works as a Science writer company   1 son age 84, lives in [place]   Completed 12th grade          Past Surgical History  Procedure Laterality Date  . Coronary artery bypass graft    . Median sternotomy      for CABG x 4 (left internal mammary artery to distal left anterior descending coronary artery, right internal mammary  artery to first circumflex marginal branch, saphenous vein graft to posterior descending coronary artery, saphenous vein graft to diagonal branch, endoscopic saphenous vein harvest from right thigh.) SURGEON: Dylan Hudson, M.D. CHO/MEDQ D:06/08/05. T: 06/09/2005 Job: [id number]  . Left knee arthroscopy    . Colonoscopy  2003, 2007, 02/2011    small adenomas  . Laparoscopic appendectomy N/A 08/06/2012    Procedure: APPENDECTOMY LAPAROSCOPIC;  Surgeon: Dylan Burn. Georgette Dover, Dylan Hudson;  Location: [facility];  Service: General;  Laterality: N/A;  . Appendectomy  2014    lap appy    Family History  Problem Relation Age of Onset  . Breast cancer Neg Hx   . Prostate cancer Neg Hx   . Colon polyps Mother   . Colon cancer Paternal Uncle     Allergies    Allergen Reactions  . Simvastatin     REACTION: Myalgias    Current Outpatient Prescriptions on File Prior to Visit  Medication Sig Dispense Refill  . aspirin 325 MG tablet Take 325 mg by mouth daily.      . fish oil-omega-3 fatty acids 1000 MG capsule Take 1 g by mouth 2 (two) times daily.        . fluticasone (FLONASE) 50 MCG/ACT nasal spray USE 2 SPRAYS INTO THE NOSE DAILY  16 g  1  . lisinopril (PRINIVIL,ZESTRIL) 10 MG tablet TAKE 1/2 TABLETS (5 MG TOTAL) BY MOUTH DAILY.  30 tablet  2  . loratadine (CLARITIN) 10 MG tablet Take 10 mg by mouth daily as needed. For allergies      . metoprolol succinate (TOPROL-XL) 50 MG 24 hr tablet TAKE 1 TABLET (50 MG TOTAL) BY MOUTH DAILY.  30 tablet  5  . pantoprazole (PROTONIX) 40 MG tablet Take 1 tablet (40 mg total) by mouth daily before breakfast.  30 tablet  11   No current facility-administered medications on file prior to visit.    BP 130/90  Pulse 52  Temp(Src) 97.4 F (36.3 C) (Oral)  Resp 18  Ht 5\' 11"  (1.803 m)  Wt 230 lb 1.3 oz (104.364 kg)  BMI 32.10 kg/m2  SpO2 99%        Objective:   Physical Exam  Physical Exam  Constitutional: He is oriented to person, place, and time. He appears well-developed and well-nourished. No distress.  HENT:  Head: Normocephalic and atraumatic.  Right Ear: Tympanic membrane and ear canal normal.  Left Ear: Tympanic membrane and ear canal normal.  Mouth/Throat: Oropharynx is clear and moist.  Eyes: Pupils are equal, round, and reactive to light. No scleral icterus.  Neck: Normal range of motion. No thyromegaly present.  Cardiovascular: Normal rate and regular rhythm.   No murmur heard. Pulmonary/Chest: Effort normal and breath sounds normal. No respiratory distress. He has no wheezes. He has no rales. He exhibits no tenderness.  Abdominal: Soft. Bowel sounds are normal. He exhibits no distension and no mass. There is no tenderness. There is no rebound and no guarding.  Musculoskeletal:  He exhibits no edema.  Lymphadenopathy:    He has no cervical adenopathy.  Neurological: He is alert and oriented to person, place, and time. He has normal patellar reflexes. He exhibits normal muscle tone. Coordination normal.  Skin: Skin is warm and dry. skin lesion on mid back and left forearm are noted.   Psychiatric: He has a normal mood and affect. His behavior is normal. Judgment and thought content normal.          Assessment & Plan:  Assessment & Plan:  Pt with 2 raised skin lesions- advised him to schedule a follow up visit for biopsy of these two lesions.

## 2013-08-14 NOTE — Addendum Note (Signed)
Addended by: Kelle Darting A on: 08/14/2013 12:56 PM   Modules accepted: Orders

## 2013-08-14 NOTE — Patient Instructions (Signed)
Please go to the [facility] lab in 6 weeks for fasting cholesterol and liver tests. Complete lab work prior to leaving today. Stop atorvastatin.  Start coenzyme Q10, Start livalo 2mg  once daily in the evening. Continue healthy diet and exercise. Work on weight loss. Follow up in 6 months.

## 2013-08-15 LAB — URINALYSIS, ROUTINE W REFLEX MICROSCOPIC
BILIRUBIN URINE: NEGATIVE
Glucose, UA: NEGATIVE mg/dL
Hgb urine dipstick: NEGATIVE
Ketones, ur: NEGATIVE mg/dL
LEUKOCYTES UA: NEGATIVE
Nitrite: NEGATIVE
PROTEIN: NEGATIVE mg/dL
Specific Gravity, Urine: 1.008 (ref 1.005–1.030)
UROBILINOGEN UA: 1 mg/dL (ref 0.0–1.0)
pH: 6.5 (ref 5.0–8.0)

## 2013-08-15 LAB — TSH: TSH: 1.884 u[IU]/mL (ref 0.350–4.500)

## 2013-08-15 LAB — PSA: PSA: 0.34 ng/mL (ref <=4.00)

## 2013-08-16 ENCOUNTER — Encounter: Payer: Self-pay | Admitting: Family

## 2013-08-17 ENCOUNTER — Telehealth: Payer: Self-pay | Admitting: *Deleted

## 2013-08-17 DIAGNOSIS — E785 Hyperlipidemia, unspecified: Secondary | ICD-10-CM

## 2013-08-17 NOTE — Telephone Encounter (Signed)
Message copied by Ronny Flurry on Mon Aug 17, 2013  3:42 PM ------      Message from: O'SULLIVAN, MELISSA      Created: Fri Aug 14, 2013 12:00 PM       Please enter flp/lft in 6 weeks for [facility], dx is hyperlipidemia ------

## 2013-08-17 NOTE — Telephone Encounter (Signed)
Lab order entered.

## 2013-09-02 ENCOUNTER — Other Ambulatory Visit: Payer: Self-pay | Admitting: Cardiology

## 2013-09-23 ENCOUNTER — Encounter (HOSPITAL_COMMUNITY): Payer: 59

## 2013-09-24 ENCOUNTER — Telehealth: Payer: Self-pay | Admitting: Family

## 2013-09-24 DIAGNOSIS — E785 Hyperlipidemia, unspecified: Secondary | ICD-10-CM

## 2013-09-24 NOTE — Telephone Encounter (Signed)
Patient left message requesting to go back to his previous cholesterol medications because the one Melissa wanted him to try is too expensive, call patient with questions

## 2013-09-25 MED ORDER — PRAVASTATIN SODIUM 40 MG PO TABS: 40.0000 mg | ORAL_TABLET | Freq: Every day | ORAL | Status: DC

## 2013-09-25 NOTE — Telephone Encounter (Signed)
Left message for pt to return my call.

## 2013-09-25 NOTE — Telephone Encounter (Signed)
Simvastatin caused him myalgia.  I would recommend that he try pravastatin. Call if myalgia develops. Repeat FLP/LFT in 6 weeks.

## 2013-09-28 NOTE — Telephone Encounter (Signed)
Patient returned phone call. Best # [phone]

## 2013-09-28 NOTE — Telephone Encounter (Signed)
Left message for pt to return my call and let us know if we can leave detailed message on his voicemail.

## 2013-09-29 NOTE — Telephone Encounter (Signed)
Notified pt and he voices understanding. Lab order entered. 

## 2013-10-01 ENCOUNTER — Other Ambulatory Visit: Payer: Self-pay | Admitting: Cardiology

## 2013-10-05 ENCOUNTER — Other Ambulatory Visit: Payer: Self-pay

## 2013-10-07 ENCOUNTER — Encounter (HOSPITAL_COMMUNITY)
Admission: RE | Admit: 2013-10-07 | Discharge: 2013-10-07 | Disposition: A | Discharge status: Home / Self Care | Payer: 59 | Source: Ambulatory Visit | Attending: Internal Medicine | Admitting: Internal Medicine

## 2013-10-07 ENCOUNTER — Encounter (HOSPITAL_COMMUNITY): Payer: Self-pay

## 2013-10-07 NOTE — Procedures (Signed)
Therapeutic phlebotomy performed. Patient became light headed moments after started. Rate slowed and patient then felt fine. Tolerated the remainder of phlebotomy well. 250 cc removed

## 2013-10-20 ENCOUNTER — Ambulatory Visit (INDEPENDENT_AMBULATORY_CARE_PROVIDER_SITE_OTHER): Payer: 59 | Admitting: Cardiology

## 2013-10-20 ENCOUNTER — Encounter: Payer: Self-pay | Admitting: Cardiology

## 2013-10-20 VITALS — BP 110/86 | HR 57 | Ht 72.0 in | Wt 233.8 lb

## 2013-10-20 DIAGNOSIS — I2510 Atherosclerotic heart disease of native coronary artery without angina pectoris: Secondary | ICD-10-CM

## 2013-10-20 NOTE — Assessment & Plan Note (Signed)
Continue present dose of Pravachol. He did not tolerate high-dose statin previously.

## 2013-10-20 NOTE — Assessment & Plan Note (Signed)
Continue aspirin and statin. 

## 2013-10-20 NOTE — Assessment & Plan Note (Signed)
Blood pressure controlled. Continue present medications. Potassium and renal function monitored by primary care. 

## 2013-10-20 NOTE — Patient Instructions (Signed)
Your physician wants you to follow-up in: [place] will receive a reminder letter in the mail two months in advance. If you don't receive a letter, please call our office to schedule the follow-up appointment.   [facility]IMARY [facility] AT [name]=[phone]

## 2013-10-20 NOTE — Progress Notes (Signed)
HPI: FU coronary artery disease. In 2007, the patient had cardiac catheterization that showed an ejection fraction of 45% with severe anterior apical hypokinesis and severe coronary disease. He ultimately underwent coronary artery bypassing graft on June 04, 2005. At that time, he had a saphenous vein graft to the PDA, status vein graft to the diagonal, a RIMA to the OM1, and a LIMA to the LAD. Echocardiogram in March of 2012 showed normal LV function, mild left atrial enlargement, mild tricuspid regurgitation and trace mitral regurgitation. Myoview in March of 2013 showed an ejection fraction of 68%. There was prior infarct but no ischemia. Also with h/o hemchromatosis. I last saw him in May 2014. Since then the patient denies any dyspnea on exertion, orthopnea, PND, pedal edema, palpitations, syncope or chest pain.   Current Outpatient Prescriptions  Medication Sig Dispense Refill  . aspirin 325 MG tablet Take 325 mg by mouth daily.      . Coenzyme Q10 100 MG TABS Take 1 tablet by mouth daily.      . fish oil-omega-3 fatty acids 1000 MG capsule Take 1 g by mouth 2 (two) times daily.        . fluticasone (FLONASE) 50 MCG/ACT nasal spray USE 2 SPRAYS INTO THE NOSE DAILY  16 g  1  . lisinopril (PRINIVIL,ZESTRIL) 10 MG tablet TAKE 1/2 TABLETS (5 MG TOTAL) BY MOUTH DAILY.  30 tablet  0  . loratadine (CLARITIN) 10 MG tablet Take 10 mg by mouth daily as needed. For allergies      . metoprolol succinate (TOPROL-XL) 50 MG 24 hr tablet TAKE 1 TABLET (50 MG TOTAL) BY MOUTH DAILY.  30 tablet  1  . pantoprazole (PROTONIX) 40 MG tablet Take 1 tablet (40 mg total) by mouth daily before breakfast.  30 tablet  11  . pravastatin (PRAVACHOL) 40 MG tablet Take 1 tablet (40 mg total) by mouth daily.  30 tablet  1   No current facility-administered medications for this visit.     Past Medical History  Diagnosis Date  . CAD (coronary artery disease)     s/p CABG 01/07  . Gilbert's syndrome    possible. (elevated bilirubin)  . Benign positional vertigo   . Hx of adenomatous colonic polyps     Medoff  . Hyperlipidemia   . Memory loss   . Hemochromatosis   . Allergy     seasonal  . Myocardial infarction 05/2005  . Internal hemorrhoid   . Diverticula of colon   . Tobacco abuse   . Vertigo   . Amenia     postoperative  . Hypertension   . Esophageal stricture 08/03/2013    Past Surgical History  Procedure Laterality Date  . Coronary artery bypass graft    . Median sternotomy      for CABG x 4 (left internal mammary artery to distal left anterior descending coronary artery, right internal mammary artery to first circumflex marginal branch, saphenous vein graft to posterior descending coronary artery, saphenous vein graft to diagonal branch, endoscopic saphenous vein harvest from right thigh.) SURGEON: Valentina Gu. Roxy Manns, M.D. CHO/MEDQ D:06/08/05. T: 06/09/2005 Job: [id number]  . Left knee arthroscopy    . Colonoscopy  2003, 2007, 02/2011    small adenomas  . Laparoscopic appendectomy N/A 08/06/2012    Procedure: APPENDECTOMY LAPAROSCOPIC;  Surgeon: Imogene Burn. Georgette Dover, MD;  Location: [facility];  Service: General;  Laterality: N/A;  . Appendectomy  2014    lap appy  History   Social History  . Marital Status: Divorced    Spouse Name: N/A    Number of Children: N/A  . Years of Education: N/A   Occupational History  . Service Occupational psychologist   Social History Main Topics  . Smoking status: Former Smoker -- 1.00 packs/day for 36 years    Types: Cigarettes    Quit date: 03/07/2006  . Smokeless tobacco: Never Used     Comment: smoked since age 48 approx. 1 pact to 1 and a half packs a day. fortunately after his bypass surgery he completely discontinued smoking  . Alcohol Use: 2.4 oz/week    4 Cans of beer per week     Comment: socially  . Drug Use: No  . Sexual Activity: Not on file   Other Topics Concern  . Not on file   Social History Narrative   Lives  with girlfriend. Fishing is a hobby.    Works as a Science writer company   1 son age 84, lives in [place]   Completed 12th grade          ROS: no fevers or chills, productive cough, hemoptysis, dysphasia, odynophagia, melena, hematochezia, dysuria, hematuria, rash, seizure activity, orthopnea, PND, pedal edema, claudication. Remaining systems are negative.  Physical Exam: Well-developed well-nourished in no acute distress.  Skin is warm and dry.  HEENT is normal.  Neck is supple.  Chest is clear to auscultation with normal expansion.  Cardiovascular exam is regular rate and rhythm.  Abdominal exam nontender or distended. No masses palpated. Extremities show no edema. neuro grossly intact  ECG Sinus rhythm at a rate of 57. No ST changes.

## 2013-11-03 ENCOUNTER — Other Ambulatory Visit: Payer: Self-pay | Admitting: Cardiology

## 2013-11-16 ENCOUNTER — Telehealth: Payer: Self-pay | Admitting: Internal Medicine

## 2013-11-16 NOTE — Telephone Encounter (Signed)
Sorry it did not last  Will need another EGD wnd dilation  Can do this Thursday but may be weeks before I can do it after that

## 2013-11-16 NOTE — Telephone Encounter (Signed)
Pt had EGD with dil in March. States he is feeling like he did prior to the procedure, having difficulty swallowing. Dr. Carlean Purl do you want to see the pt for an OV or do you want him to have a direct procedure? Please advise.

## 2013-11-17 ENCOUNTER — Ambulatory Visit (AMBULATORY_SURGERY_CENTER): Payer: Self-pay | Admitting: *Deleted

## 2013-11-17 VITALS — Ht 72.0 in | Wt 235.0 lb

## 2013-11-17 DIAGNOSIS — K222 Esophageal obstruction: Secondary | ICD-10-CM

## 2013-11-17 NOTE — Telephone Encounter (Signed)
Pt scheduled for previsit today at 10:30am. Pt scheduled for EGD with dil in the [facility] 11/19/13@2pm . Pt aware of appt date and time.

## 2013-11-17 NOTE — Progress Notes (Signed)
No allergies to eggs or soy. No problems with anesthesia.  Pt given Emmi instructions for EGD  No oxygen use  No diet drug use  

## 2013-11-19 ENCOUNTER — Encounter: Payer: Self-pay | Admitting: Internal Medicine

## 2013-11-19 ENCOUNTER — Ambulatory Visit (AMBULATORY_SURGERY_CENTER): Payer: 59 | Admitting: Internal Medicine

## 2013-11-19 VITALS — BP 115/69 | HR 51 | Temp 96.8°F | Resp 12 | Ht 72.0 in | Wt 235.0 lb

## 2013-11-19 DIAGNOSIS — K222 Esophageal obstruction: Secondary | ICD-10-CM

## 2013-11-19 DIAGNOSIS — IMO0001 Reserved for inherently not codable concepts without codable children: Secondary | ICD-10-CM

## 2013-11-19 DIAGNOSIS — R1110 Vomiting, unspecified: Secondary | ICD-10-CM

## 2013-11-19 HISTORY — PX: ESOPHAGEAL DILATION: SHX303

## 2013-11-19 MED ORDER — PANTOPRAZOLE SODIUM 40 MG PO TBEC: 40.0000 mg | DELAYED_RELEASE_TABLET | Freq: Every day | ORAL | Status: DC

## 2013-11-19 MED ORDER — SODIUM CHLORIDE 0.9 % IV SOLN: 500.0000 mL | INTRAVENOUS | Status: DC

## 2013-11-19 NOTE — Op Note (Signed)
[facility]  [address] [place], [address]   ENDOSCOPY PROCEDURE REPORT  PATIENT: Dylan Hudson, Dylan Hudson  MR#: [id number] BIRTHDATE: [birth year] , 23  yrs. old GENDER: Male ENDOSCOPIST: Gatha Mayer, MD, [facility] PROCEDURE DATE:  11/19/2013 PROCEDURE:  Esophagoscopy  and balloon dilation < 30 mm ASA CLASS:     Class II INDICATIONS:  Dysphagia.   Therapeutic procedure.  stricture dilation MEDICATIONS: propofol (Diprivan) 250mg  IV, MAC sedation, administered by CRNA, and These medications were titrated to patient response per physician's verbal order TOPICAL ANESTHETIC: none  DESCRIPTION OF PROCEDURE: After the risks benefits and alternatives of the procedure were thoroughly explained, informed consent was obtained.  The LB [id number] endoscope was introduced through the mouth and advanced to the stomach antrum. Without limitations.  The instrument was slowly withdrawn as the mucosa was fully examined.        ESOPHAGUS: A stricture was found at the gastroesophageal junction. The stenosis was traversable with the endoscope.  The remainder of the upper endoscopy exam was otherwise normal. Retroflexed views revealed a hiatal hernia.   The stricture was dilated 18,19 and 20 mm with a balloon. Slight heme, good effect. The scope was then withdrawn from the patient and the procedure completed.  COMPLICATIONS: There were no complications. ENDOSCOPIC IMPRESSION: 1.   Stricture was found at the gastroesophageal junction 2.   Hiatus hernia  RECOMMENDATIONS: 1.  Clear liquids until 330 PM , then soft foods rest of day. Resume prior diet tomorrow. 2.  PPI qam - restart and stay on pantoprazole 40 mg every AM   eSigned:  Gatha Mayer, MD, [facility] 11/19/2013 2:16 PM   CC:The Patient

## 2013-11-19 NOTE — Progress Notes (Signed)
Procedure ends, to recovery, report given and VSS. 

## 2013-11-19 NOTE — Patient Instructions (Addendum)
I dilated the stricture. Please start and stay on pantoprazole to block acid and reduce chance of recurrent swallowing problems. I sent a prescription to your pharmacy.  [place]  I appreciate the opportunity to care for you. Gatha Mayer, MD, FACG   YOU HAD AN ENDOSCOPIC PROCEDURE TODAY AT [facility]: Refer to the procedure report that was given to you for any specific questions about what was found during the examination.  If the procedure report does not answer your questions, please call your gastroenterologist to clarify.  If you requested that your care partner not be given the details of your procedure findings, then the procedure report has been included in a sealed envelope for you to review at your convenience later.  YOU SHOULD EXPECT: Some feelings of bloating in the abdomen. Passage of more gas than usual.  Walking can help get rid of the air that was put into your GI tract during the procedure and reduce the bloating. If you had a lower endoscopy (such as a colonoscopy or flexible sigmoidoscopy) you may notice spotting of blood in your stool or on the toilet paper. If you underwent a bowel prep for your procedure, then you may not have a normal bowel movement for a few days.  DIET: Your first meal following the procedure should be a light meal and then it is ok to progress to your normal diet.  A half-sandwich or bowl of soup is an example of a good first meal.  Heavy or fried foods are harder to digest and may make you feel nauseous or bloated.  Likewise meals heavy in dairy and vegetables can cause extra gas to form and this can also increase the bloating.  Drink plenty of fluids but you should avoid alcoholic beverages for 24 hours.  ACTIVITY: Your care partner should take you home directly after the procedure.  You should plan to take it easy, moving slowly for the rest of the day.  You can resume normal activity the day after the  procedure however you should NOT DRIVE or use heavy machinery for 24 hours (because of the sedation medicines used during the test).    SYMPTOMS TO REPORT IMMEDIATELY: A gastroenterologist can be reached at any hour.  During normal business hours, 8:30 AM to 5:00 PM Monday through Friday, call [phone].  After hours and on weekends, please call the GI answering service at [phone] who will take a message and have the physician on call contact you.   Following lower endoscopy (colonoscopy or flexible sigmoidoscopy):  Excessive amounts of blood in the stool  Significant tenderness or worsening of abdominal pains  Swelling of the abdomen that is new, acute  Fever of 100F or higher  Following upper endoscopy (EGD)  Vomiting of blood or coffee ground material  New chest pain or pain under the shoulder blades  Painful or persistently difficult swallowing  New shortness of breath  Fever of 100F or higher  Black, tarry-looking stools  FOLLOW UP: If any biopsies were taken you will be contacted by phone or by letter within the next 1-3 weeks.  Call your gastroenterologist if you have not heard about the biopsies in 3 weeks.  Our staff will call the home number listed on your records the next business day following your procedure to check on you and address any questions or concerns that you may have at that time regarding the information given to you following  your procedure. This is a courtesy call and so if there is no answer at the home number and we have not heard from you through the emergency physician on call, we will assume that you have returned to your regular daily activities without incident.  SIGNATURES/CONFIDENTIALITY: You and/or your care partner have signed paperwork which will be entered into your electronic medical record.  These signatures attest to the fact that that the information above on your After Visit Summary has been reviewed and is understood.  Full  responsibility of the confidentiality of this discharge information lies with you and/or your care-partner.   sticture and hiatal hernia information given.  Clear liquids juntil 3:30, then soft foodes rest of day,  Soft foods rest of day. After dilation diet sheet given to pt. With explanation.

## 2013-12-01 ENCOUNTER — Other Ambulatory Visit: Payer: Self-pay | Admitting: Cardiology

## 2013-12-01 ENCOUNTER — Other Ambulatory Visit: Payer: Self-pay

## 2013-12-02 ENCOUNTER — Encounter (HOSPITAL_COMMUNITY): Payer: Self-pay

## 2013-12-02 ENCOUNTER — Encounter (HOSPITAL_COMMUNITY)
Admission: RE | Admit: 2013-12-02 | Discharge: 2013-12-02 | Disposition: A | Discharge status: Home / Self Care | Payer: 59 | Source: Ambulatory Visit | Attending: Internal Medicine | Admitting: Internal Medicine

## 2013-12-02 NOTE — Procedures (Signed)
Patient tolerated 250 ml phlebotomy. Denied any dizziness. VS stable upon DC

## 2014-01-13 ENCOUNTER — Other Ambulatory Visit: Payer: Self-pay | Admitting: Family

## 2014-01-13 NOTE — Telephone Encounter (Signed)
Left message for patient to return my call.

## 2014-01-13 NOTE — Telephone Encounter (Signed)
Rx request to pharmacy, 30-day only/SLS **PATIENT DUE FOR APPOINTMENT & LABS PRIOR TO FUTURE REFILLS**  Please call patient and have him scheduleF/U appointment [and he will need to be fasting for labs]/SLS Thanks.

## 2014-01-15 NOTE — Telephone Encounter (Signed)
appoitment scheduled for last september

## 2014-01-18 ENCOUNTER — Telehealth: Payer: Self-pay | Admitting: Family

## 2014-01-18 NOTE — Telephone Encounter (Addendum)
Caller name: Jj  Relation to pt: self  Call back number: [phone]   Reason for call: pt requesting refill does not know the name of medication. Pt does have a f/u appointment for 02/15/14

## 2014-01-18 NOTE — Telephone Encounter (Signed)
Pt

## 2014-01-18 NOTE — Telephone Encounter (Signed)
Spoke with Loletha Grayer at [facility] and verified receipt of pravastatin refill from last week. Also verified that all Rxs have refills remaining. Notified pt.

## 2014-01-26 ENCOUNTER — Other Ambulatory Visit: Payer: Self-pay

## 2014-01-27 ENCOUNTER — Encounter (HOSPITAL_COMMUNITY): Admission: RE | Admit: 2014-01-27 | Payer: 59 | Source: Ambulatory Visit

## 2014-02-10 ENCOUNTER — Encounter (HOSPITAL_COMMUNITY): Payer: Self-pay

## 2014-02-10 ENCOUNTER — Encounter (HOSPITAL_COMMUNITY)
Admission: RE | Admit: 2014-02-10 | Discharge: 2014-02-10 | Disposition: A | Discharge status: Home / Self Care | Payer: 59 | Source: Ambulatory Visit | Attending: Internal Medicine | Admitting: Internal Medicine

## 2014-02-10 NOTE — Progress Notes (Signed)
Dylan Hudson presents today for phlebotomy per MD orders. HGB/HCT: not required/done in MD office and not ordered before each procedure her Phlebotomy procedure started at 1405 and ended at 1410 250 cc removed. Patient tolerated procedure well. IV needle removed intact.

## 2014-02-10 NOTE — Discharge Instructions (Signed)
IF YOU ARE GOING TO BE 15 OR MINUTES LATE FOR YOUR APPOINTMENT, PLEASE CALL [phone] TO MAKE OTHER ARRANGEMENTS FOR YOUR TREATMENT  IF YOU ARRIVE EARLY FOR YOUR SCHEDULED APPOINTMENT , YOU MAY HAVE TO WAIT UNTIL YOUR SCHEDULED TIME.Therapeutic Phlebotomy, Care After Refer to this sheet in the next few weeks. These instructions provide you with information on caring for yourself after your procedure. Your caregiver may also give you more specific instructions. Your treatment has been planned according to current medical practices, but problems sometimes occur. Call your caregiver if you have any problems or questions after your procedure. HOME CARE INSTRUCTIONS Most people can go back to their normal activities right away. Before you leave, be sure to ask if there is anything you should or should not do. In general, it would be wise to:  Keep the bandage dry. You can remove the bandage after about 5 hours.  Eat well-balanced meals for the next 24 hours.  Drink enough fluids to keep your urine clear or pale yellow.  Avoid drinking alcohol minimally until after eating.  Avoid smoking for at least 30 minutes after the procedure.  Avoid strenuous physical activity or heavy lifting or pulling for about 5 hours after the procedure.  Athletes should avoid strenuous exercise for 12 hours or more.  Change positions slowly for the remainder of the day to prevent light-headedness or fainting.  If you feel light-headed, lie down until the feeling subsides.  If you have bleeding from the needle insertion site, elevate your arm and press firmly on the site until the bleeding stops.  If bruising or bleeding appears under the skin, apply ice to the area for 15 to 20 minutes, 3 to 4 times per day. Put the ice in a plastic bag and place a towel between the bag of ice and your skin. Do this while you are awake for the first 24 hours. The ice packs can be stopped before 24 hours if the swelling goes away. If  swelling persists after 24 hours, a warm, moist washcloth can be applied to the area for 15 to 20 minutes, 3 to 4 times per day. The warm, moist treatments can be stopped when the swelling goes away.  It is important to continue further therapeutic phlebotomy as directed by your caregiver. SEEK MEDICAL CARE IF:  There is bleeding or fluid leaking from the needle insertion site.  The needle insertion site becomes swollen, red, or sore.  You feel light-headed, dizzy or nauseated, and the feeling does not go away.  You notice new bruising at the needle insertion site.  You feel more weak or tired than normal.  You develop a fever. SEEK IMMEDIATE MEDICAL CARE IF:   There is increased bleeding, pain, or swelling from the needle insertion site.  You have severe nausea or vomiting.  You have chest pain.  You have trouble breathing. MAKE SURE YOU:  Understand these instructions.  Will watch your condition.  Will get help right away if you are not doing well or get worse. Document Released: 10/09/2010 Document Revised: 09/21/2013 Document Reviewed: 10/09/2010 [facility] Patient Information 2015 [place], [place]. This information is not intended to replace advice given to you by your health care provider. Make sure you discuss any questions you have with your health care provider.

## 2014-02-15 ENCOUNTER — Ambulatory Visit (INDEPENDENT_AMBULATORY_CARE_PROVIDER_SITE_OTHER): Payer: 59 | Admitting: Family

## 2014-02-15 ENCOUNTER — Encounter: Payer: Self-pay | Admitting: Family

## 2014-02-15 VITALS — BP 130/80 | HR 59 | Temp 97.5°F | Resp 16 | Ht 71.0 in | Wt 239.0 lb

## 2014-02-15 DIAGNOSIS — R17 Unspecified jaundice: Secondary | ICD-10-CM

## 2014-02-15 DIAGNOSIS — I10 Essential (primary) hypertension: Secondary | ICD-10-CM

## 2014-02-15 DIAGNOSIS — E785 Hyperlipidemia, unspecified: Secondary | ICD-10-CM

## 2014-02-15 DIAGNOSIS — Z23 Encounter for immunization: Secondary | ICD-10-CM

## 2014-02-15 DIAGNOSIS — R739 Hyperglycemia, unspecified: Secondary | ICD-10-CM

## 2014-02-15 DIAGNOSIS — R7309 Other abnormal glucose: Secondary | ICD-10-CM

## 2014-02-15 LAB — LIPID PANEL
CHOL/HDL RATIO: 7
Cholesterol: 233 mg/dL — ABNORMAL HIGH (ref 0–200)
HDL: 31.7 mg/dL — ABNORMAL LOW (ref >39.00)
NonHDL: 201.3
Triglycerides: 226 mg/dL — ABNORMAL HIGH (ref 0.0–149.0)
VLDL: 45.2 mg/dL — AB (ref 0.0–40.0)

## 2014-02-15 LAB — BASIC METABOLIC PANEL
BUN: 12 mg/dL (ref 6–23)
CHLORIDE: 101 meq/L (ref 96–112)
CO2: 28 meq/L (ref 19–32)
Calcium: 9 mg/dL (ref 8.4–10.5)
Creatinine, Ser: 1.1 mg/dL (ref 0.4–1.5)
GFR: 76.96 mL/min (ref >60.00)
GLUCOSE: 82 mg/dL (ref 70–99)
Potassium: 3.8 mEq/L (ref 3.5–5.1)
SODIUM: 135 meq/L (ref 135–145)

## 2014-02-15 LAB — HEPATIC FUNCTION PANEL
ALBUMIN: 3.9 g/dL (ref 3.5–5.2)
ALT: 60 U/L — ABNORMAL HIGH (ref 0–53)
AST: 37 U/L (ref 0–37)
Alkaline Phosphatase: 44 U/L (ref 39–117)
Bilirubin, Direct: 0.3 mg/dL (ref 0.0–0.3)
Total Bilirubin: 1.3 mg/dL — ABNORMAL HIGH (ref 0.2–1.2)
Total Protein: 6.8 g/dL (ref 6.0–8.3)

## 2014-02-15 LAB — HEMOGLOBIN A1C: HEMOGLOBIN A1C: 5.2 % (ref 4.6–6.5)

## 2014-02-15 MED ORDER — EZETIMIBE 10 MG PO TABS: 10.0000 mg | ORAL_TABLET | Freq: Every day | ORAL | Status: DC

## 2014-02-15 NOTE — Assessment & Plan Note (Signed)
He is receiving phlebotomy at hematology per Dr. Carlean Purl.

## 2014-02-15 NOTE — Assessment & Plan Note (Signed)
Wants to stop statin due to intolerance.  Trial of zetia. Reinforced importance of heart healthy diet.

## 2014-02-15 NOTE — Assessment & Plan Note (Signed)
Obtain follow up A1C.   

## 2014-02-15 NOTE — Assessment & Plan Note (Signed)
BP stable on current meds.  Obtain bmet. 

## 2014-02-15 NOTE — Progress Notes (Signed)
Pre visit review using our clinic review tool, if applicable. No additional management support is needed unless otherwise documented below in the visit note. 

## 2014-02-15 NOTE — Patient Instructions (Signed)
Please complete lab work prior to leaving. Start zetia in place of pravastatin. Schedule a lab appointment at the front desk for 6 weeks. Follow up for office visit in 4 months.

## 2014-02-15 NOTE — Progress Notes (Signed)
Subjective:    Patient ID: Dylan Hudson, male    DOB: [birth year], 53 y.o.   MRN: [id number]  HPI  Dylan Hudson is a 53 yr old male who presents today for follow up.  1) HTN- mantained on lisinopril and metoprolol. Denies cp/sob or swelling BP Readings from Last 3 Encounters:  02/15/14 130/80  02/10/14 115/78  12/02/13 126/78     2) Hyperlipidemia-maintained on pravastatin. Reports + severe cramping bilateral legs with walking which he attributes to statin use.  He is taking every 3rd day without improvement.  Feels he can no longer tolerate statins.  Lab Results  Component Value Date   CHOL 154 08/14/2013   HDL 31* 08/14/2013   LDLCALC 98 08/14/2013   LDLDIRECT 138.6 11/13/2012   TRIG 123 08/14/2013   CHOLHDL 5.0 08/14/2013    3) Hyperglycemia-  Lab Results  Component Value Date   HGBA1C 5.4 08/13/2012   4) hemachromatosis- Dr. Carlean Purl is managing his phlebotomy.   Review of Systems See HPI  Past Medical History  Diagnosis Date  . CAD (coronary artery disease)     s/p CABG 01/07  . Gilbert's syndrome     possible. (elevated bilirubin)  . Benign positional vertigo   . Hx of adenomatous colonic polyps     Medoff  . Hyperlipidemia   . Memory loss   . Hemochromatosis   . Allergy     seasonal  . Myocardial infarction 05/2005  . Internal hemorrhoid   . Diverticula of colon   . Tobacco abuse   . Vertigo   . Amenia     postoperative  . Hypertension   . Esophageal stricture 08/03/2013    History   Social History  . Marital Status: Divorced    Spouse Name: N/A    Number of Children: N/A  . Years of Education: N/A   Occupational History  . Service Occupational psychologist   Social History Main Topics  . Smoking status: Former Smoker -- 1.00 packs/day for 36 years    Types: Cigarettes    Quit date: 03/07/2006  . Smokeless tobacco: Never Used     Comment: smoked since age 36 approx. 1 pact to 1 and a half packs a day. fortunately after his bypass  surgery he completely discontinued smoking  . Alcohol Use: 2.4 oz/week    4 Cans of beer per week     Comment: socially  . Drug Use: No  . Sexual Activity: Not on file   Other Topics Concern  . Not on file   Social History Narrative   Lives with girlfriend. Fishing is a hobby.    Works as a Science writer company   1 son age 37, lives in [place]   Completed 12th grade          Past Surgical History  Procedure Laterality Date  . Coronary artery bypass graft    . Median sternotomy      for CABG x 4 (left internal mammary artery to distal left anterior descending coronary artery, right internal mammary artery to first circumflex marginal branch, saphenous vein graft to posterior descending coronary artery, saphenous vein graft to diagonal branch, endoscopic saphenous vein harvest from right thigh.) SURGEON: Valentina Gu. Roxy Manns, M.D. CHO/MEDQ D:06/08/05. T: 06/09/2005 Job: [id number]  . Left knee arthroscopy    . Colonoscopy  2003, 2007, 02/2011    small adenomas  . Laparoscopic appendectomy N/A 08/06/2012  Procedure: APPENDECTOMY LAPAROSCOPIC;  Surgeon: Imogene Burn. Georgette Dover, MD;  Location: [facility];  Service: General;  Laterality: N/A;  . Appendectomy  2014    lap appy  . Esophageal dilation  11/19/13    Family History  Problem Relation Age of Onset  . Breast cancer Neg Hx   . Prostate cancer Neg Hx   . Colon polyps Mother   . Colon cancer Paternal Uncle     Allergies  Allergen Reactions  . Pravastatin     Myalgia   . Simvastatin     REACTION: Myalgias    Current Outpatient Prescriptions on File Prior to Visit  Medication Sig Dispense Refill  . aspirin 325 MG tablet Take 325 mg by mouth daily.      . fish oil-omega-3 fatty acids 1000 MG capsule Take 1 g by mouth 2 (two) times daily.        . fluticasone (FLONASE) 50 MCG/ACT nasal spray USE 2 SPRAYS INTO THE NOSE DAILY  16 g  1  . lisinopril (PRINIVIL,ZESTRIL) 10 MG tablet TAKE 1/2 TABLETS (5 MG  TOTAL) BY MOUTH DAILY.  30 tablet  5  . loratadine (CLARITIN) 10 MG tablet Take 10 mg by mouth daily as needed. For allergies      . metoprolol succinate (TOPROL-XL) 50 MG 24 hr tablet TAKE 1 TABLET (50 MG TOTAL) BY MOUTH DAILY.  30 tablet  5  . pantoprazole (PROTONIX) 40 MG tablet Take 1 tablet (40 mg total) by mouth daily before breakfast.  90 tablet  3   No current facility-administered medications on file prior to visit.    BP 130/80  Pulse 59  Temp(Src) 97.5 F (36.4 C) (Oral)  Resp 16  Ht 5\' 11"  (1.803 m)  Wt 239 lb (108.41 kg)  BMI 33.35 kg/m2  SpO2 98%       Objective:   Physical Exam  Constitutional: He is oriented to person, place, and time. He appears well-developed and well-nourished. No distress.  HENT:  Head: Normocephalic and atraumatic.  Eyes: Scleral icterus is present.  Cardiovascular: Normal rate and regular rhythm.   No murmur heard. Pulmonary/Chest: Effort normal and breath sounds normal. No respiratory distress. He has no wheezes. He has no rales. He exhibits no tenderness.  Musculoskeletal: He exhibits no edema.  Neurological: He is alert and oriented to person, place, and time.  Skin: Skin is warm and dry.  Psychiatric: He has a normal mood and affect. His behavior is normal. Judgment and thought content normal.          Assessment & Plan:

## 2014-02-16 LAB — LDL CHOLESTEROL, DIRECT: LDL DIRECT: 159.7 mg/dL

## 2014-02-17 ENCOUNTER — Other Ambulatory Visit: Payer: Self-pay | Admitting: Family

## 2014-02-17 NOTE — Telephone Encounter (Signed)
Pravastatin request denied as pt stated at last visit that he could not tolerate side effects (muscle cramps). Med was d/c'd.

## 2014-02-18 ENCOUNTER — Telehealth: Payer: Self-pay | Admitting: Family

## 2014-02-18 DIAGNOSIS — E785 Hyperlipidemia, unspecified: Secondary | ICD-10-CM

## 2014-02-18 MED ORDER — COLESEVELAM HCL 625 MG PO TABS: 1875.0000 mg | ORAL_TABLET | Freq: Two times a day (BID) | ORAL | Status: DC

## 2014-02-18 NOTE — Telephone Encounter (Signed)
Please let pt know that cholesterol remains elevated.  I would like him to continue zetia and add welchol (not a statin).  Continue low fat/low cholesterol diet, exercise. Repeat lft/flp in 3 months, dx hyperlipidemia

## 2014-02-19 NOTE — Telephone Encounter (Signed)
Notified pt and he states he will go to [facility] in 3 months for blood draw. Future order entered. Pt also states that zetia copay is $125 and he cannot afford this.  Please advise.

## 2014-03-06 ENCOUNTER — Telehealth: Payer: Self-pay | Admitting: Family

## 2014-03-06 NOTE — Telephone Encounter (Signed)
Please contact pt re: unread mychart message:  I received your message re: zetia cost. I would recommend that you go to the [employer] website and print off a coupon card. The company claims that you can get 12 months at $25 per rx. Hope that helps.

## 2014-03-08 NOTE — Telephone Encounter (Signed)
Notified pt and he voices understanding. 

## 2014-03-30 ENCOUNTER — Telehealth: Payer: Self-pay | Admitting: Family

## 2014-03-30 NOTE — Telephone Encounter (Signed)
Is pt referring to welchol or zetia?  Did pt try to get the Zetia coupon online for $25 copay per month for 1 year?  Left message for pt to return my call.

## 2014-03-30 NOTE — Telephone Encounter (Signed)
Caller name: Naif Relation to pt: self Call back number: [phone] Pharmacy:  Reason for call:   Patient states that the cholesterol medication is too expensive and would like something else sent.

## 2014-04-01 NOTE — Telephone Encounter (Signed)
Did he go to zetia.com or another web site?  The other idea I have for him would be to try livalo which is another statin that is the best tolerated in terms of muscle pain.  Otherwise, we could try something like niacin, but it is not as effective.

## 2014-04-01 NOTE — Telephone Encounter (Signed)
Spoke with pt. He printed coupon for zetia and with discount his cost will be $150. Will $200 without coupon. Rx still too expensive and pt would like cheaper alternative?  Please advise.

## 2014-04-02 MED ORDER — PITAVASTATIN CALCIUM 2 MG PO TABS: 1.0000 | ORAL_TABLET | Freq: Every day | ORAL | Status: DC

## 2014-04-02 NOTE — Telephone Encounter (Signed)
Spoke with pt. He states he went to the website we gave him. Also states that he tried Niacin in the past and it made him feel like he was having a heart attack.  Updated pt's allergy list. He is agreeable to try Livalo and requests Rx go to [facility].

## 2014-04-14 ENCOUNTER — Encounter (HOSPITAL_COMMUNITY): Payer: 59

## 2014-04-20 ENCOUNTER — Encounter (HOSPITAL_COMMUNITY): Payer: Self-pay

## 2014-04-20 ENCOUNTER — Encounter (HOSPITAL_COMMUNITY)
Admission: RE | Admit: 2014-04-20 | Discharge: 2014-04-20 | Disposition: A | Discharge status: Home / Self Care | Payer: 59 | Source: Ambulatory Visit | Attending: Internal Medicine | Admitting: Internal Medicine

## 2014-04-20 NOTE — Progress Notes (Signed)
Spoke to [employer] with  Dr Lorayne Bender  re Orders for phlebotomy, phlebotomy is to be done every 8 weeks,as previously ordered 250cc to be removed. Last done on September 23

## 2014-04-20 NOTE — Progress Notes (Signed)
Dylan Hudson presents today for phlebotomy per MD orders. HGB/HCT: Phlebotomy procedure started at 1317 and ended at 1326. 250 cc removed. Patient tolerated procedure well. IV needle removed intact.

## 2014-04-20 NOTE — Discharge Instructions (Signed)

## 2014-05-06 ENCOUNTER — Other Ambulatory Visit: Payer: Self-pay | Admitting: Cardiology

## 2014-06-18 ENCOUNTER — Ambulatory Visit (INDEPENDENT_AMBULATORY_CARE_PROVIDER_SITE_OTHER): Payer: 59 | Admitting: Family

## 2014-06-18 ENCOUNTER — Encounter: Payer: Self-pay | Admitting: Family

## 2014-06-18 DIAGNOSIS — E785 Hyperlipidemia, unspecified: Secondary | ICD-10-CM

## 2014-06-18 DIAGNOSIS — I10 Essential (primary) hypertension: Secondary | ICD-10-CM

## 2014-06-18 MED ORDER — PITAVASTATIN CALCIUM 2 MG PO TABS: 1.0000 | ORAL_TABLET | Freq: Every day | ORAL | Status: DC

## 2014-06-18 NOTE — Progress Notes (Signed)
Subjective:    Patient ID: Dylan Hudson, male    DOB: 21-Apr-1961, 54 y.o.   MRN: [id number]  HPI  Patient here for follow up of multiple medical problems.   1) Hyperlipidemia- Patient is currently maintained on the following medication for hyperlipidemia:none, agreeable to start livalo. Last lipid panel as follows:  Lab Results  Component Value Date   CHOL 233* 02/15/2014   HDL 31.70* 02/15/2014   LDLCALC 98 08/14/2013   LDLDIRECT 159.7 02/15/2014   TRIG 226.0* 02/15/2014   CHOLHDL 7 02/15/2014  Patient denies myalgia. Patient reports good compliance with low fat/low cholesterol diet.   HTN- Patient is currently maintained on the following medications for blood pressure: lisinopril, toprol xl.  Patient reports good compliance with blood pressure medications. Patient denies chest pain, shortness of breath or swelling. Last 3 blood pressure readings in our office are as follows: BP Readings from Last 3 Encounters:  06/18/14 112/70  02/15/14 130/80  11/19/13 115/69    Hemochromatosis- he continues phlebotomy- with Dr. Silvano Rusk.    Past Medical History  Diagnosis Date  . CAD (coronary artery disease)     s/p CABG 01/07  . Gilbert's syndrome     possible. (elevated bilirubin)  . Benign positional vertigo   . Hx of adenomatous colonic polyps     Medoff  . Hyperlipidemia   . Memory loss   . Hemochromatosis   . Allergy     seasonal  . Myocardial infarction 05/2005  . Internal hemorrhoid   . Diverticula of colon   . Tobacco abuse   . Vertigo   . Amenia     postoperative  . Hypertension   . Esophageal stricture 08/03/2013    Review of Systems See HPI     Objective:    Physical Exam  Constitutional: He is oriented to person, place, and time. He appears well-developed and well-nourished. No distress.  HENT:  Head: Normocephalic and atraumatic.  Cardiovascular: Normal rate and regular rhythm.   No murmur heard. Pulmonary/Chest: Effort normal and breath  sounds normal. No respiratory distress. He has no wheezes. He has no rales.  Musculoskeletal: He exhibits no edema.  Neurological: He is alert and oriented to person, place, and time.  Skin: Skin is warm and dry.  Psychiatric: He has a normal mood and affect. His behavior is normal. Thought content normal.    BP 112/70 mmHg  Pulse 56  Temp(Src) 97.6 F (36.4 C) (Oral)  Resp 16  Ht 5\' 11"  (1.803 m)  Wt 235 lb (106.595 kg)  BMI 32.79 kg/m2  SpO2 99% Wt Readings from Last 3 Encounters:  06/18/14 235 lb (106.595 kg)  02/15/14 239 lb (108.41 kg)  11/19/13 235 lb (106.595 kg)     Lab Results  Component Value Date   WBC 5.2 08/14/2013   HGB 14.2 08/14/2013   HCT 42.1 08/14/2013   PLT 216 08/14/2013   GLUCOSE 82 02/15/2014   CHOL 233* 02/15/2014   TRIG 226.0* 02/15/2014   HDL 31.70* 02/15/2014   LDLDIRECT 159.7 02/15/2014   LDLCALC 98 08/14/2013   ALT 60* 02/15/2014   AST 37 02/15/2014   NA 135 02/15/2014   K 3.8 02/15/2014   CL 101 02/15/2014   CREATININE 1.1 02/15/2014   BUN 12 02/15/2014   CO2 28 02/15/2014   TSH 1.884 08/14/2013   PSA 0.34 08/14/2013   INR 1.03 03/03/2012   HGBA1C 5.2 02/15/2014    No results found.     Assessment &  Plan:   Problem List Items Addressed This Visit    None       Nance Pear., NP

## 2014-06-18 NOTE — Assessment & Plan Note (Signed)
Continue phlebotomy, with Dr. Carlean Purl

## 2014-06-18 NOTE — Assessment & Plan Note (Signed)
BP stable on current meds, continue same.  

## 2014-06-18 NOTE — Patient Instructions (Signed)
Follow up in 3 months. Start livalo.

## 2014-06-18 NOTE — Progress Notes (Signed)
Pre visit review using our clinic review tool, if applicable. No additional management support is needed unless otherwise documented below in the visit note. 

## 2014-06-18 NOTE — Assessment & Plan Note (Signed)
Never started welchol, zetia too expensive. Trial of livalo (never started)- gave pt coupon card today.  Plan repeat flp in 3 months.

## 2014-07-13 ENCOUNTER — Other Ambulatory Visit: Payer: Self-pay

## 2014-07-14 ENCOUNTER — Encounter (HOSPITAL_COMMUNITY): Payer: Self-pay

## 2014-07-14 ENCOUNTER — Encounter (HOSPITAL_COMMUNITY)
Admission: RE | Admit: 2014-07-14 | Discharge: 2014-07-14 | Disposition: A | Discharge status: Home / Self Care | Payer: 59 | Source: Ambulatory Visit | Attending: Internal Medicine | Admitting: Internal Medicine

## 2014-07-14 NOTE — Procedures (Signed)
250 cc phlebotomy completed. Patient tolerated well

## 2014-09-14 ENCOUNTER — Telehealth: Payer: Self-pay | Admitting: Family

## 2014-09-14 DIAGNOSIS — E785 Hyperlipidemia, unspecified: Secondary | ICD-10-CM

## 2014-09-14 NOTE — Telephone Encounter (Signed)
Your prescribed a cholesterol med for him that is $200 a month.  He needs a different med called in to [facility].  He cancelled his follow up for this week and will schedule 3 months from when he starts taking the new med you call in

## 2014-09-14 NOTE — Telephone Encounter (Signed)
Spoke with pt. He states that his cost with the coupon is $200. Would be $300 without coupon. Pt states he has been out of the medication for 1 month. Wants cheaper alternative and to r/s f/u 3 months after starting new med.  Please advise.

## 2014-09-14 NOTE — Telephone Encounter (Signed)
My records indicate that he has not tolerated other statins.  The last one he tried was pravastatin, and he did not tolerate.  Zetia was a non-statin that was too expensive for him.  At this point, my best recommendation is that we refer him to lipid clinic to see if he is a candidate for some of the newer cholesterol lowering meds such as Praluent.

## 2014-09-14 NOTE — Telephone Encounter (Signed)
Did he present 25$ coupon for livalo? Shouldn't cost him any more than that with coupon.

## 2014-09-14 NOTE — Telephone Encounter (Signed)
Left message on voicemail to return my call.  

## 2014-09-15 ENCOUNTER — Encounter (HOSPITAL_COMMUNITY): Payer: Self-pay

## 2014-09-15 ENCOUNTER — Encounter (HOSPITAL_COMMUNITY)
Admission: RE | Admit: 2014-09-15 | Discharge: 2014-09-15 | Disposition: A | Discharge status: Home / Self Care | Payer: 59 | Source: Ambulatory Visit | Attending: Internal Medicine | Admitting: Internal Medicine

## 2014-09-15 NOTE — Procedures (Deleted)
No note

## 2014-09-15 NOTE — Telephone Encounter (Signed)
Notified pt and he is agreeable to proceed with referral. 

## 2014-09-15 NOTE — Procedures (Signed)
1325-Therapeutic phlebotomy started. 1332-Procedure completed, 296ml removed per order, pt. Tolerated well.

## 2014-09-17 ENCOUNTER — Ambulatory Visit: Payer: 59 | Admitting: Family

## 2014-10-06 ENCOUNTER — Other Ambulatory Visit: Payer: Self-pay | Admitting: Cardiology

## 2014-10-20 NOTE — Progress Notes (Signed)
HPI: FU coronary artery disease. In 2007, the patient had cardiac catheterization that showed an ejection fraction of 45% with severe anterior apical hypokinesis and severe coronary disease. He ultimately underwent coronary artery bypassing graft on June 04, 2005. At that time, he had a saphenous vein graft to the PDA, status vein graft to the diagonal, a RIMA to the OM1, and a LIMA to the LAD. Echocardiogram in March of 2012 showed normal LV function, mild left atrial enlargement, mild tricuspid regurgitation and trace mitral regurgitation. Myoview in March of 2013 showed an ejection fraction of 68%. There was prior infarct but no ischemia. Also with h/o hemchromatosis. Since I last saw him, the patient denies any dyspnea on exertion, orthopnea, PND, pedal edema, palpitations, syncope or chest pain.`   Current Outpatient Prescriptions  Medication Sig Dispense Refill  . aspirin 325 MG tablet Take 325 mg by mouth daily.    . fish oil-omega-3 fatty acids 1000 MG capsule Take 1 g by mouth 2 (two) times daily.      . fluticasone (FLONASE) 50 MCG/ACT nasal spray USE 2 SPRAYS INTO THE NOSE DAILY 16 g 1  . lisinopril (PRINIVIL,ZESTRIL) 10 MG tablet TAKE 1/2 TABLET (5 MG TOTAL) BY MOUTH DAILY. 30 tablet 1  . loratadine (CLARITIN) 10 MG tablet Take 10 mg by mouth daily as needed. For allergies    . metoprolol succinate (TOPROL-XL) 50 MG 24 hr tablet TAKE 1 TABLET (50 MG TOTAL) BY MOUTH DAILY. 30 tablet 1  . pantoprazole (PROTONIX) 40 MG tablet Take 1 tablet (40 mg total) by mouth daily before breakfast. 90 tablet 3  . Red Yeast Rice 600 MG CAPS Take 1 capsule by mouth daily.     No current facility-administered medications for this visit.     Past Medical History  Diagnosis Date  . CAD (coronary artery disease)     s/p CABG 01/07  . Gilbert's syndrome     possible. (elevated bilirubin)  . Benign positional vertigo   . Hx of adenomatous colonic polyps     Medoff  . Hyperlipidemia   .  Memory loss   . Hemochromatosis   . Allergy     seasonal  . Myocardial infarction 05/2005  . Internal hemorrhoid   . Diverticula of colon   . Tobacco abuse   . Vertigo   . Amenia     postoperative  . Hypertension   . Esophageal stricture 08/03/2013    Past Surgical History  Procedure Laterality Date  . Coronary artery bypass graft    . Median sternotomy      for CABG x 4 (left internal mammary artery to distal left anterior descending coronary artery, right internal mammary artery to first circumflex marginal branch, saphenous vein graft to posterior descending coronary artery, saphenous vein graft to diagonal branch, endoscopic saphenous vein harvest from right thigh.) SURGEON: Valentina Gu. Roxy Manns, M.D. CHO/MEDQ D:06/08/05. T: 06/09/2005 Job: [id number]  . Left knee arthroscopy    . Colonoscopy  2003, 2007, 02/2011    small adenomas  . Laparoscopic appendectomy N/A 08/06/2012    Procedure: APPENDECTOMY LAPAROSCOPIC;  Surgeon: Imogene Burn. Georgette Dover, MD;  Location: [facility];  Service: General;  Laterality: N/A;  . Appendectomy  2014    lap appy  . Esophageal dilation  11/19/13    History   Social History  . Marital Status: Divorced    Spouse Name: N/A  . Number of Children: N/A  . Years of Education: N/A  Occupational History  . Service Occupational psychologist   Social History Main Topics  . Smoking status: Former Smoker -- 1.00 packs/day for 36 years    Types: Cigarettes    Quit date: 03/07/2006  . Smokeless tobacco: Never Used     Comment: smoked since age 58 approx. 1 pact to 1 and a half packs a day. fortunately after his bypass surgery he completely discontinued smoking  . Alcohol Use: 2.4 oz/week    4 Cans of beer per week     Comment: socially  . Drug Use: No  . Sexual Activity: Not on file   Other Topics Concern  . Not on file   Social History Narrative   Lives with girlfriend. Fishing is a hobby.    Works as a Science writer company     1 son age 62, lives in [place]   Completed 12th grade          ROS: no fevers or chills, productive cough, hemoptysis, dysphasia, odynophagia, melena, hematochezia, dysuria, hematuria, rash, seizure activity, orthopnea, PND, pedal edema, claudication. Remaining systems are negative.  Physical Exam: Well-developed well-nourished in no acute distress.  Skin is warm and dry.  HEENT is normal.  Neck is supple.  Chest is clear to auscultation with normal expansion.  Cardiovascular exam is regular rate and rhythm.  Abdominal exam nontender or distended. No masses palpated. Extremities show no edema. neuro grossly intact  ECG sinus rhythm at a rate of 68. No ST changes.

## 2014-10-22 ENCOUNTER — Ambulatory Visit (INDEPENDENT_AMBULATORY_CARE_PROVIDER_SITE_OTHER): Payer: 59 | Admitting: Cardiology

## 2014-10-22 ENCOUNTER — Encounter: Payer: Self-pay | Admitting: Cardiology

## 2014-10-22 VITALS — BP 124/70 | HR 68 | Ht 72.0 in | Wt 233.7 lb

## 2014-10-22 DIAGNOSIS — I2510 Atherosclerotic heart disease of native coronary artery without angina pectoris: Secondary | ICD-10-CM | POA: Diagnosis not present

## 2014-10-22 NOTE — Assessment & Plan Note (Signed)
Continue diet and present medications. He has not tolerated statins because of myalgias. Check lipids and liver.

## 2014-10-22 NOTE — Patient Instructions (Signed)
Your physician wants you to follow-up in: [place] will receive a reminder letter in the mail two months in advance. If you don't receive a letter, please call our office to schedule the follow-up appointment.   Your physician recommends that you return for lab work PRIOR TO EATING= [place]

## 2014-10-22 NOTE — Assessment & Plan Note (Signed)
Continue aspirin and statin. 

## 2014-10-22 NOTE — Assessment & Plan Note (Signed)
Blood pressure controlled. Continue present medications. Check potassium and renal function. 

## 2014-11-09 ENCOUNTER — Other Ambulatory Visit: Payer: Self-pay

## 2014-11-10 ENCOUNTER — Encounter (HOSPITAL_COMMUNITY)
Admission: RE | Admit: 2014-11-10 | Discharge: 2014-11-10 | Disposition: A | Discharge status: Home / Self Care | Payer: 59 | Source: Ambulatory Visit | Attending: Internal Medicine | Admitting: Internal Medicine

## 2014-11-10 ENCOUNTER — Encounter (HOSPITAL_COMMUNITY): Payer: Self-pay

## 2014-11-10 NOTE — Progress Notes (Signed)
Dylan Hudson presents today for phlebotomy per MD orders.  Phlebotomy procedure started at 1328 and ended at 1338. 250cc removed. Patient tolerated procedure well. IV needle removed intact.

## 2014-11-10 NOTE — Procedures (Deleted)
No note

## 2014-11-16 ENCOUNTER — Other Ambulatory Visit: Payer: Self-pay | Admitting: Internal Medicine

## 2014-11-30 ENCOUNTER — Telehealth: Payer: Self-pay | Admitting: *Deleted

## 2014-11-30 NOTE — Telephone Encounter (Signed)
Received email from [facility] that [facility] is requiring prior authorization. Per 09/14/14 phone note. Med was too expensive even with co-pay card and pt was referred to the lipid clinic.  Left message for pt to return my call. Need to verify Livalo request as pt was referred to lipid clinic in 08/2014.  ?They should be managing medication.

## 2014-12-01 NOTE — Telephone Encounter (Signed)
Attempted to reach pt and left message for him to check mychart message that I sent him re: below questions.

## 2015-01-08 ENCOUNTER — Other Ambulatory Visit: Payer: Self-pay | Admitting: Cardiology

## 2015-01-11 ENCOUNTER — Encounter (HOSPITAL_COMMUNITY)
Admission: RE | Admit: 2015-01-11 | Discharge: 2015-01-11 | Disposition: A | Discharge status: Home / Self Care | Payer: Commercial Managed Care - HMO | Source: Ambulatory Visit | Attending: Internal Medicine | Admitting: Internal Medicine

## 2015-01-11 ENCOUNTER — Encounter (HOSPITAL_COMMUNITY): Payer: Self-pay

## 2015-01-11 NOTE — Discharge Instructions (Signed)

## 2015-01-11 NOTE — Progress Notes (Signed)
Dylan Hudson presents today for phlebotomy per MD order Phlebotomy procedure started at 1315 and ended at 1330 250 cc removed. Patient tolerated procedure well. IV needle removed intact.

## 2015-02-04 ENCOUNTER — Other Ambulatory Visit: Payer: Self-pay | Admitting: Cardiology

## 2015-02-04 NOTE — Telephone Encounter (Signed)
Rx has been sent to the pharmacy electronically. ° °

## 2015-02-05 ENCOUNTER — Other Ambulatory Visit: Payer: Self-pay | Admitting: Internal Medicine

## 2015-03-07 ENCOUNTER — Other Ambulatory Visit: Payer: Self-pay

## 2015-03-09 ENCOUNTER — Encounter (HOSPITAL_COMMUNITY)
Admission: RE | Admit: 2015-03-09 | Payer: Commercial Managed Care - HMO | Source: Ambulatory Visit | Attending: Internal Medicine | Admitting: Internal Medicine

## 2015-03-21 ENCOUNTER — Encounter (HOSPITAL_COMMUNITY)
Admission: RE | Admit: 2015-03-21 | Discharge: 2015-03-21 | Disposition: A | Discharge status: Home / Self Care | Payer: Commercial Managed Care - HMO | Source: Ambulatory Visit | Attending: Internal Medicine | Admitting: Internal Medicine

## 2015-03-21 ENCOUNTER — Encounter (HOSPITAL_COMMUNITY): Payer: Self-pay

## 2015-03-21 HISTORY — PX: THERAPEUTIC PHLEBOTOMY: LAB6004

## 2015-03-21 NOTE — Procedures (Signed)
Dylan Hudson presents today for phlebotomy per MD order Phlebotomy procedure started at 1315 and ended at 1324 250 cc removed. Patient tolerated procedure well. IV needle removed intact.

## 2015-04-02 ENCOUNTER — Other Ambulatory Visit: Payer: Self-pay | Admitting: Internal Medicine

## 2015-05-03 ENCOUNTER — Telehealth: Payer: Self-pay | Admitting: Family

## 2015-05-03 NOTE — Telephone Encounter (Signed)
OK with me.

## 2015-05-03 NOTE — Telephone Encounter (Signed)
Due to location pt would like to transfer care to [facility].  Is this ok with you?

## 2015-05-07 ENCOUNTER — Other Ambulatory Visit: Payer: Self-pay | Admitting: Internal Medicine

## 2015-05-25 ENCOUNTER — Encounter (HOSPITAL_COMMUNITY)
Admission: RE | Admit: 2015-05-25 | Discharge: 2015-05-25 | Disposition: A | Discharge status: Home / Self Care | Payer: Commercial Managed Care - HMO | Source: Ambulatory Visit | Attending: Internal Medicine | Admitting: Internal Medicine

## 2015-05-25 ENCOUNTER — Encounter (HOSPITAL_COMMUNITY): Payer: Self-pay

## 2015-05-25 NOTE — Procedures (Signed)
250 cc therapeutic phlebotomy completed and patient tolerated well. Drank Sprite, ate graham crackers during procedure. Denies dizziness upon arrival

## 2015-07-14 ENCOUNTER — Other Ambulatory Visit: Payer: Self-pay

## 2015-07-20 ENCOUNTER — Encounter (HOSPITAL_COMMUNITY)
Admission: RE | Admit: 2015-07-20 | Discharge: 2015-07-20 | Disposition: A | Discharge status: Home / Self Care | Payer: Commercial Managed Care - HMO | Source: Ambulatory Visit | Attending: Internal Medicine | Admitting: Internal Medicine

## 2015-07-20 ENCOUNTER — Encounter (HOSPITAL_COMMUNITY): Payer: Self-pay

## 2015-07-20 NOTE — Procedures (Signed)
Pt here for Therapeutic phlebotomy: #20 iv cath started to left hand, excellent blood return.  250 cc drawn out as ordered.  Started phlebotomy at 1450 and finished at 1515.  Vss, afebrile post phlebotomy.  Pt drinking a soda and eating graham crackers.  Pt sitting in recliner with feet up.  Pt stayed about 15 minutes after therapeutic phlebotomy complete.  No dizziness or lightheadedness.  Pt stood up in the room and walked around also to make sure he felt okay.  Pt d/c ambulatory to lobby.

## 2015-08-23 ENCOUNTER — Other Ambulatory Visit: Payer: Self-pay | Admitting: Internal Medicine

## 2015-09-07 ENCOUNTER — Other Ambulatory Visit: Payer: Self-pay

## 2015-09-14 ENCOUNTER — Encounter (HOSPITAL_COMMUNITY)
Admission: RE | Admit: 2015-09-14 | Discharge: 2015-09-14 | Disposition: A | Discharge status: Home / Self Care | Payer: Commercial Managed Care - HMO | Source: Ambulatory Visit | Attending: Internal Medicine | Admitting: Internal Medicine

## 2015-09-14 ENCOUNTER — Encounter (HOSPITAL_COMMUNITY): Payer: Self-pay

## 2015-09-14 ENCOUNTER — Other Ambulatory Visit: Payer: Self-pay

## 2015-09-14 ENCOUNTER — Telehealth: Payer: Self-pay

## 2015-09-14 NOTE — Telephone Encounter (Signed)
Patient is in Short Stay at [facility]. He needs to schedule the next phlebotomy. Most recent orders are indicating he is to have this done every month. Previously for the past 2 years it has been every other month. Please clarify the orders. Thanks

## 2015-09-14 NOTE — Progress Notes (Signed)
Dylan Hudson presents today for phlebotomy per MD orders.  Phlebotomy procedure started at 1412 and ended at 1425. 257 cc removed. Patient tolerated procedure well. IV needle removed intact.

## 2015-09-14 NOTE — Telephone Encounter (Signed)
I am not seeing recent phlebotomy Ok to do one but I need a CBC AND a ferritin drawn when they do it and then I will tell him interval  I think he used to have a half phlebotomy so he wouldn't pass out  Dx hemocchromatosis

## 2015-09-14 NOTE — Discharge Instructions (Signed)
Therapeutic Phlebotomy, Care After  Refer to this sheet in the next few weeks. These instructions provide you with information about caring for yourself after your procedure. Your health care provider may also give you more specific instructions. Your treatment has been planned according to current medical practices, but problems sometimes occur. Call your health care provider if you have any problems or questions after your procedure.  WHAT TO EXPECT AFTER THE PROCEDURE  After your procedure, it is common to have:   Light-headedness or dizziness. You may feel faint.   Nausea.   Tiredness.  HOME CARE INSTRUCTIONS  Activities   Return to your normal activities as directed by your health care provider. Most people can go back to their normal activities right away.   Avoid strenuous physical activity and heavy lifting or pulling for about 5 hours after the procedure. Do not lift anything that is heavier than 10 lb (4.5 kg).   Athletes should avoid strenuous exercise for at least 12 hours.   Change positions slowly for the remainder of the day. This will help to prevent light-headedness or fainting.   If you feel light-headed, lie down until the feeling goes away.  Eating and Drinking   Be sure to eat well-balanced meals for the next 24 hours.   Drink enough fluid to keep your urine clear or pale yellow.   Avoid drinking alcohol on the day that you had the procedure.  Care of the Needle Insertion Site   Keep your bandage dry. You can remove the bandage after about 5 hours or as directed by your health care provider.   If you have bleeding from the needle insertion site, elevate your arm and press firmly on the site until the bleeding stops.   If you have bruising at the site, apply ice to the area:   Put ice in a plastic bag.   Place a towel between your skin and the bag.   Leave the ice on for 20 minutes, 2-3 times a day for the first 24 hours.   If the swelling does not go away after 24 hours, apply  a warm, moist washcloth to the area for 20 minutes, 2-3 times a day.  General Instructions   Avoid smoking for at least 30 minutes after the procedure.   Keep all follow-up visits as directed by your health care provider. It is important to continue with further therapeutic phlebotomy treatments as directed.  SEEK MEDICAL CARE IF:   You have redness, swelling, or pain at the needle insertion site.   You have fluid, blood, or pus coming from the needle insertion site.   You feel light-headed, dizzy, or nauseated, and the feeling does not go away.   You notice new bruising at the needle insertion site.   You feel weaker than normal.   You have a fever or chills.  SEEK IMMEDIATE MEDICAL CARE IF:   You have severe nausea or vomiting.   You have chest pain.   You have trouble breathing.    This information is not intended to replace advice given to you by your health care provider. Make sure you discuss any questions you have with your health care provider.    Document Released: 10/09/2010 Document Revised: 09/21/2014 Document Reviewed: 05/03/2014  Elsevier Interactive Patient Education 2016 [employer]

## 2015-09-15 ENCOUNTER — Other Ambulatory Visit: Payer: Self-pay | Admitting: *Deleted

## 2015-09-15 MED ORDER — METOPROLOL SUCCINATE ER 50 MG PO TB24: 50.0000 mg | ORAL_TABLET | Freq: Every day | ORAL | Status: DC

## 2015-09-21 ENCOUNTER — Other Ambulatory Visit (INDEPENDENT_AMBULATORY_CARE_PROVIDER_SITE_OTHER): Payer: Commercial Managed Care - HMO

## 2015-09-21 LAB — CBC WITH DIFFERENTIAL/PLATELET
Basophils Absolute: 0 10*3/uL (ref 0.0–0.1)
Basophils Relative: 1 % (ref 0.0–3.0)
EOS ABS: 0.2 10*3/uL (ref 0.0–0.7)
Eosinophils Relative: 5.5 % — ABNORMAL HIGH (ref 0.0–5.0)
HEMATOCRIT: 31.6 % — AB (ref 39.0–52.0)
Hemoglobin: 10 g/dL — ABNORMAL LOW (ref 13.0–17.0)
Lymphocytes Relative: 31.8 % (ref 12.0–46.0)
Lymphs Abs: 1.3 10*3/uL (ref 0.7–4.0)
MCHC: 31.7 g/dL (ref 30.0–36.0)
MONOS PCT: 18 % — AB (ref 3.0–12.0)
Monocytes Absolute: 0.8 10*3/uL (ref 0.1–1.0)
NEUTROS ABS: 1.8 10*3/uL (ref 1.4–7.7)
Neutrophils Relative %: 43.7 % (ref 43.0–77.0)
PLATELETS: 198 10*3/uL (ref 150.0–400.0)
RBC: 4.61 Mil/uL (ref 4.22–5.81)
RDW: 18.1 % — ABNORMAL HIGH (ref 11.5–15.5)
WBC: 4.2 10*3/uL (ref 4.0–10.5)

## 2015-09-21 LAB — FERRITIN: Ferritin: 5.6 ng/mL — ABNORMAL LOW (ref 22.0–322.0)

## 2015-09-23 NOTE — Progress Notes (Signed)
Quick Note:  He is anemic so we will hold phlebotomy Is he bleeding?  Have him see me please - should not be urgent ______

## 2015-10-26 ENCOUNTER — Other Ambulatory Visit: Payer: Self-pay | Admitting: Internal Medicine

## 2015-11-09 ENCOUNTER — Inpatient Hospital Stay (HOSPITAL_COMMUNITY): Admission: RE | Admit: 2015-11-09 | Payer: 59 | Source: Ambulatory Visit

## 2015-11-24 ENCOUNTER — Encounter: Payer: Self-pay | Admitting: Internal Medicine

## 2015-11-24 ENCOUNTER — Ambulatory Visit (INDEPENDENT_AMBULATORY_CARE_PROVIDER_SITE_OTHER): Payer: 59 | Admitting: Internal Medicine

## 2015-11-24 ENCOUNTER — Other Ambulatory Visit (INDEPENDENT_AMBULATORY_CARE_PROVIDER_SITE_OTHER): Payer: 59

## 2015-11-24 VITALS — BP 114/70 | HR 60 | Ht 70.75 in | Wt 236.0 lb

## 2015-11-24 DIAGNOSIS — D500 Iron deficiency anemia secondary to blood loss (chronic): Secondary | ICD-10-CM

## 2015-11-24 DIAGNOSIS — K648 Other hemorrhoids: Secondary | ICD-10-CM

## 2015-11-24 LAB — CBC WITH DIFFERENTIAL/PLATELET
BASOS PCT: 0.9 % (ref 0.0–3.0)
Basophils Absolute: 0 10*3/uL (ref 0.0–0.1)
Eosinophils Absolute: 0.2 10*3/uL (ref 0.0–0.7)
Eosinophils Relative: 6.3 % — ABNORMAL HIGH (ref 0.0–5.0)
HCT: 33.4 % — ABNORMAL LOW (ref 39.0–52.0)
Hemoglobin: 10.5 g/dL — ABNORMAL LOW (ref 13.0–17.0)
Lymphocytes Relative: 31.6 % (ref 12.0–46.0)
Lymphs Abs: 1.2 10*3/uL (ref 0.7–4.0)
MCHC: 31.4 g/dL (ref 30.0–36.0)
MCV: 66.8 fl — ABNORMAL LOW (ref 78.0–100.0)
MONO ABS: 0.8 10*3/uL (ref 0.1–1.0)
MONOS PCT: 20.3 % — AB (ref 3.0–12.0)
Neutro Abs: 1.6 10*3/uL (ref 1.4–7.7)
Neutrophils Relative %: 40.9 % — ABNORMAL LOW (ref 43.0–77.0)
Platelets: 198 10*3/uL (ref 150.0–400.0)
RBC: 5.01 Mil/uL (ref 4.22–5.81)
RDW: 18.2 % — AB (ref 11.5–15.5)
WBC: 3.9 10*3/uL — AB (ref 4.0–10.5)

## 2015-11-24 LAB — FERRITIN: FERRITIN: 6.5 ng/mL — AB (ref 22.0–322.0)

## 2015-11-24 NOTE — Progress Notes (Signed)
Subjective:    Patient ID: Dylan Hudson, male    DOB: 11-Dec-1960, 55 y.o.   MRN: [id number] Cc anemia HPI He is here after finding anemia and low ferrition. Had been getting regular phlebotomy but no Hgb checks x 2 yrs. CBC Latest Ref Rng 11/24/2015 09/21/2015 08/14/2013  WBC 4.0 - 10.5 K/uL 3.9(L) 4.2 5.2  Hemoglobin 13.0 - 17.0 g/dL 10.5(L) 10.0(L) 14.2  Hematocrit 39.0 - 52.0 % 33.4(L) 31.6(L) 42.1  Platelets 150.0 - 400.0 K/uL 198.0 198.0 216    He wants a PCP closer to home in [place]  Has chronic rare red blood on paper - no other bleeding Has had egd and clonoscopy in past and is UTD on colonoscopy - it shows hemorrhoids Allergies  Allergen Reactions  . Niacin And Related Other (See Comments)    Chest pain  . Pravachol [Pravastatin]     Myalgia   . Red Yeast Rice [Cholestin]     Jitters   . Zocor [Simvastatin]     REACTION: Myalgias   Outpatient Prescriptions Prior to Visit  Medication Sig Dispense Refill  . aspirin 325 MG tablet Take 325 mg by mouth daily.    . fish oil-omega-3 fatty acids 1000 MG capsule Take 1 g by mouth 2 (two) times daily.      . fluticasone (FLONASE) 50 MCG/ACT nasal spray USE 2 SPRAYS INTO THE NOSE DAILY 16 g 1  . lisinopril (PRINIVIL,ZESTRIL) 10 MG tablet TAKE 1/2 TABLET (5 MG TOTAL) BY MOUTH DAILY. 30 tablet 6  . loratadine (CLARITIN) 10 MG tablet Take 10 mg by mouth daily as needed. For allergies    . metoprolol succinate (TOPROL-XL) 50 MG 24 hr tablet Take 1 tablet (50 mg total) by mouth daily. Take with or immediately following a meal. 30 tablet 3  . pantoprazole (PROTONIX) 40 MG tablet TAKE 1 TABLET (40 MG TOTAL) BY MOUTH DAILY BEFORE BREAKFAST.  -- NEEDS APPOINTMENTS FOR ADDITIONAL REFILLS, PLEASE CALL DR. 30 tablet 0  . Red Yeast Rice 600 MG CAPS Take 1 capsule by mouth daily.     No facility-administered medications prior to visit.   Past Medical History  Diagnosis Date  . CAD (coronary artery disease)     s/p CABG 01/07  .  Gilbert's syndrome     possible. (elevated bilirubin)  . Benign positional vertigo   . Hx of adenomatous colonic polyps     Medoff  . Hyperlipidemia   . Memory loss   . Hemochromatosis   . Allergy     seasonal  . Myocardial infarction ([place]) 05/2005  . Internal hemorrhoid   . Diverticula of colon   . Tobacco abuse   . Vertigo   . Amenia     postoperative  . Hypertension   . Esophageal stricture 08/03/2013   Past Surgical History  Procedure Laterality Date  . Coronary artery bypass graft    . Median sternotomy      for CABG x 4 (left internal mammary artery to distal left anterior descending coronary artery, right internal mammary artery to first circumflex marginal branch, saphenous vein graft to posterior descending coronary artery, saphenous vein graft to diagonal branch, endoscopic saphenous vein harvest from right thigh.) SURGEON: Valentina Gu. Roxy Manns, M.D. CHO/MEDQ D:06/08/05. T: 06/09/2005 Job: [id number]  . Left knee arthroscopy    . Colonoscopy  2003, 2007, 02/2011    small adenomas  . Laparoscopic appendectomy N/A 08/06/2012    Procedure: APPENDECTOMY LAPAROSCOPIC;  Surgeon: Imogene Burn. Tsuei,  MD;  Location: [facility];  Service: General;  Laterality: N/A;  . Appendectomy  2014    lap appy  . Esophageal dilation  11/19/13  . Therapeutic phlebotomy  03/21/2015        Social History   Social History  . Marital Status: Divorced    Spouse Name: N/A  . Number of Children: N/A  . Years of Education: N/A   Occupational History  . Service Occupational psychologist   Social History Main Topics  . Smoking status: Former Smoker -- 1.00 packs/day for 36 years    Types: Cigarettes    Quit date: 03/07/2006  . Smokeless tobacco: Never Used     Comment: smoked since age 58 approx. 1 pact to 1 and a half packs a day. fortunately after his bypass surgery he completely discontinued smoking  . Alcohol Use: 2.4 oz/week    4 Cans of beer per week     Comment: socially  . Drug Use: No    . Sexual Activity: Not Asked   Other Topics Concern  . None   Social History Narrative   Lives with girlfriend. Fishing is a hobby.    Works as a Science writer company   1 son age 48, lives in [place]   Completed 12th grade         Family History  Problem Relation Age of Onset  . Breast cancer Neg Hx   . Prostate cancer Neg Hx   . Colon polyps Mother   . Colon cancer Paternal Uncle        Review of Systems Ok - no problems    Objective:   Physical Exam BP 114/70 mmHg  Pulse 60  Ht 5' 10.75" (1.797 m)  Wt 236 lb (107.049 kg)  BMI 33.15 kg/m2 NAD     Assessment & Plan:   Encounter Diagnoses  Name Primary?  . Iron deficiency anemia due to chronic blood loss Yes  . Hemochromatosis   . Bleeding internal hemorrhoids    He must have been phlebotomized too much That is on hold He will need ferrous sulfate Will also have him do guaiacs and if + endoscopic evaluation  Review phlebotomy protocol i.e. Should have a Hgb sent always

## 2015-11-24 NOTE — Patient Instructions (Signed)
  Your physician has requested that you go to the basement for the following lab work before leaving today: CBC/diff, Ferritin   I appreciate the opportunity to care for you. Carl Gessner, MD, FACG 

## 2015-11-25 NOTE — Progress Notes (Signed)
Quick Note:  Let him know ferrous sulfate is needed 325 mg daily Recheck CBC and ferritin in 2 months I also want him to do guaiac cards but be careful not to do if hemorrhoids are flaring/bleeding  Dx iron def anemia due to chronic blood loss (phlebotomy) No more phlebotomy for now  I will let him know about new PCP ______

## 2015-11-25 NOTE — Progress Notes (Signed)
HPI: FU coronary artery disease. In 2007, the patient had cardiac catheterization that showed an ejection fraction of 45% with severe anterior apical hypokinesis and severe coronary disease. He ultimately underwent coronary artery bypassing graft on June 04, 2005. At that time, he had a saphenous vein graft to the PDA, status vein graft to the diagonal, a RIMA to the OM1, and a LIMA to the LAD. Echocardiogram in March of 2012 showed normal LV function, mild left atrial enlargement, mild tricuspid regurgitation and trace mitral regurgitation. Myoview in March of 2013 showed an ejection fraction of 68%. There was prior infarct but no ischemia. Also with h/o hemchromatosis. Since I last saw him, the patient has dyspnea with more extreme activities but not with routine activities. It is relieved with rest. It is not associated with chest pain. There is no orthopnea, PND or pedal edema. There is no syncope or palpitations. There is no exertional chest pain.   Current Outpatient Prescriptions  Medication Sig Dispense Refill  . aspirin 325 MG tablet Take 325 mg by mouth daily.    . fish oil-omega-3 fatty acids 1000 MG capsule Take 1 g by mouth 2 (two) times daily.      . fluticasone (FLONASE) 50 MCG/ACT nasal spray USE 2 SPRAYS INTO THE NOSE DAILY 16 g 1  . lisinopril (PRINIVIL,ZESTRIL) 10 MG tablet TAKE 1/2 TABLET (5 MG TOTAL) BY MOUTH DAILY. 30 tablet 6  . loratadine (CLARITIN) 10 MG tablet Take 10 mg by mouth daily as needed. For allergies    . metoprolol succinate (TOPROL-XL) 50 MG 24 hr tablet Take 1 tablet (50 mg total) by mouth daily. Take with or immediately following a meal. 30 tablet 3  . pantoprazole (PROTONIX) 40 MG tablet Take 1 tablet (40 mg total) by mouth daily. 90 tablet 3   No current facility-administered medications for this visit.     Past Medical History  Diagnosis Date  . CAD (coronary artery disease)     s/p CABG 01/07  . Gilbert's syndrome     possible. (elevated  bilirubin)  . Benign positional vertigo   . Hx of adenomatous colonic polyps     Medoff  . Hyperlipidemia   . Memory loss   . Hemochromatosis   . Allergy     seasonal  . Myocardial infarction ([place]) 05/2005  . Internal hemorrhoid   . Diverticula of colon   . Tobacco abuse   . Vertigo   . Amenia     postoperative  . Hypertension   . Esophageal stricture 08/03/2013    Past Surgical History  Procedure Laterality Date  . Coronary artery bypass graft    . Median sternotomy      for CABG x 4 (left internal mammary artery to distal left anterior descending coronary artery, right internal mammary artery to first circumflex marginal branch, saphenous vein graft to posterior descending coronary artery, saphenous vein graft to diagonal branch, endoscopic saphenous vein harvest from right thigh.) SURGEON: Valentina Gu. Roxy Manns, M.D. CHO/MEDQ D:06/08/05. T: 06/09/2005 Job: [id number]  . Left knee arthroscopy    . Colonoscopy  2003, 2007, 02/2011    small adenomas  . Laparoscopic appendectomy N/A 08/06/2012    Procedure: APPENDECTOMY LAPAROSCOPIC;  Surgeon: Imogene Burn. Georgette Dover, MD;  Location: [facility];  Service: General;  Laterality: N/A;  . Appendectomy  2014    lap appy  . Esophageal dilation  11/19/13  . Therapeutic phlebotomy  03/21/2015  Social History   Social History  . Marital Status: Divorced    Spouse Name: N/A  . Number of Children: N/A  . Years of Education: N/A   Occupational History  . Service Occupational psychologist   Social History Main Topics  . Smoking status: Former Smoker -- 1.00 packs/day for 36 years    Types: Cigarettes    Quit date: 03/07/2006  . Smokeless tobacco: Never Used     Comment: smoked since age 56 approx. 1 pact to 1 and a half packs a day. fortunately after his bypass surgery he completely discontinued smoking  . Alcohol Use: 2.4 oz/week    4 Cans of beer per week     Comment: socially  . Drug Use: No  . Sexual Activity: Not on file   Other  Topics Concern  . Not on file   Social History Narrative   Lives with girlfriend. Fishing is a hobby.    Works as a Science writer company   1 son age 77, lives in [place]   Completed 12th grade          Family History  Problem Relation Age of Onset  . Breast cancer Neg Hx   . Prostate cancer Neg Hx   . Colon polyps Mother   . Colon cancer Paternal Uncle     ROS: no fevers or chills, productive cough, hemoptysis, dysphasia, odynophagia, melena, hematochezia, dysuria, hematuria, rash, seizure activity, orthopnea, PND, pedal edema, claudication. Remaining systems are negative.  Physical Exam: Well-developed well-nourished in no acute distress.  Skin is warm and dry.  HEENT is normal.  Neck is supple.  Chest is clear to auscultation with normal expansion.  Cardiovascular exam is regular rate and rhythm.  Abdominal exam nontender or distended. No masses palpated. Extremities show no edema. neuro grossly intact  ECG Sinus rhythm at a rate of 63. No ST changes. Assessment and plan  1 coronary artery disease-continue aspirin. Intolerant to statins.  2 hypertension-blood pressure controlled. Continue present medications. Check potassium and renal function. 3 hyperlipidemia-continue present medications. He has not tolerated statins previously. Check lipids and liver. We'll consider adding Zetia if LDL is elevated. 4 history of hemochromatosis-check echocardiogram to evaluate LV systolic function and make sure no involvement.  Kirk Ruths, MD

## 2015-11-28 ENCOUNTER — Other Ambulatory Visit: Payer: Self-pay

## 2015-11-28 ENCOUNTER — Other Ambulatory Visit: Payer: Self-pay | Admitting: Internal Medicine

## 2015-11-28 ENCOUNTER — Telehealth: Payer: Self-pay | Admitting: Internal Medicine

## 2015-11-28 NOTE — Telephone Encounter (Signed)
Reviewed with patient iron 325 mg daily.  Ferrous sulfate

## 2015-11-28 NOTE — Telephone Encounter (Signed)
Yes please

## 2015-11-28 NOTE — Telephone Encounter (Signed)
Seen on 11/24/15 , may I refill it for a year Sir?

## 2015-11-29 ENCOUNTER — Encounter: Payer: Self-pay | Admitting: Cardiology

## 2015-11-29 ENCOUNTER — Ambulatory Visit (INDEPENDENT_AMBULATORY_CARE_PROVIDER_SITE_OTHER): Payer: 59 | Admitting: Cardiology

## 2015-11-29 VITALS — BP 130/80 | HR 63 | Ht 70.0 in | Wt 234.0 lb

## 2015-11-29 DIAGNOSIS — I2510 Atherosclerotic heart disease of native coronary artery without angina pectoris: Secondary | ICD-10-CM

## 2015-11-29 DIAGNOSIS — I10 Essential (primary) hypertension: Secondary | ICD-10-CM | POA: Diagnosis not present

## 2015-11-29 DIAGNOSIS — E785 Hyperlipidemia, unspecified: Secondary | ICD-10-CM | POA: Diagnosis not present

## 2015-11-29 NOTE — Patient Instructions (Signed)
Medications  No Change   Lab work  LIPID, LIVER, & BMET; today if possible   Procedures  Your physician has requested that you have an echocardiogram. Echocardiography is a painless test that uses sound waves to create images of your heart. It provides your doctor with information about the size and shape of your heart and how well your heart's chambers and valves are working. This procedure takes approximately one hour. There are no restrictions for this procedure.    Follow-up in 1 year with Dr. Stanford Breed.

## 2015-11-30 LAB — BASIC METABOLIC PANEL WITH GFR
BUN: 11 mg/dL (ref 7–25)
CALCIUM: 9.2 mg/dL (ref 8.6–10.3)
CO2: 23 mmol/L (ref 20–31)
CREATININE: 1.01 mg/dL (ref 0.70–1.33)
Chloride: 103 mmol/L (ref 98–110)
GFR, EST NON AFRICAN AMERICAN: 82 mL/min (ref >=60)
Glucose, Bld: 97 mg/dL (ref 65–99)
POTASSIUM: 4.6 mmol/L (ref 3.5–5.3)
SODIUM: 142 mmol/L (ref 135–146)

## 2015-11-30 LAB — LIPID PANEL
Cholesterol: 195 mg/dL (ref 125–200)
HDL: 26 mg/dL — ABNORMAL LOW (ref >=40)
LDL CALC: 126 mg/dL (ref <130)
TRIGLYCERIDES: 217 mg/dL — AB (ref <150)
Total CHOL/HDL Ratio: 7.5 Ratio — ABNORMAL HIGH (ref <=5.0)
VLDL: 43 mg/dL — ABNORMAL HIGH (ref <30)

## 2015-11-30 LAB — HEPATIC FUNCTION PANEL
ALK PHOS: 47 U/L (ref 40–115)
ALT: 44 U/L (ref 9–46)
AST: 33 U/L (ref 10–35)
Albumin: 4.1 g/dL (ref 3.6–5.1)
BILIRUBIN INDIRECT: 1 mg/dL (ref 0.2–1.2)
BILIRUBIN TOTAL: 1.3 mg/dL — AB (ref 0.2–1.2)
Bilirubin, Direct: 0.3 mg/dL — ABNORMAL HIGH (ref <=0.2)
TOTAL PROTEIN: 6.6 g/dL (ref 6.1–8.1)

## 2015-12-06 ENCOUNTER — Telehealth: Payer: Self-pay

## 2015-12-06 NOTE — Telephone Encounter (Signed)
-----   Message from Gatha Mayer, MD sent at 12/05/2015  5:29 PM EDT ----- Regarding: new PCP Let him know that the [facility] is [facility] but not [facility] - am not familiar with that Doctor  Suggest he get a new patient appt [name]  Drs Alain Marion, Caprice Renshaw or Burns are options

## 2015-12-06 NOTE — Telephone Encounter (Signed)
  Spoke with Dylan Hudson and he now lives in [place] and wants to go downstairs.  He has tried to get in and they say he has to be released from [name] MD who is at [facility].  Dr Carlean Purl is sending her a message regarding this.  Once released I will try to get him a yearly check up set up downstairs in primary care- [name].

## 2015-12-13 ENCOUNTER — Telehealth: Payer: Self-pay | Admitting: Family

## 2015-12-13 NOTE — Telephone Encounter (Signed)
Ok with me 

## 2015-12-13 NOTE — Telephone Encounter (Signed)
Patient is looking to change to [facility] due to geographical needs.   Melissa- is this ok with you?   Marya Amsler- is this ok with you?

## 2015-12-13 NOTE — Telephone Encounter (Signed)
I went downstairs and spoke with the PCP office and they will have Rachel Bo the team leader check into this matter for me.

## 2015-12-14 ENCOUNTER — Ambulatory Visit (HOSPITAL_COMMUNITY): Payer: 59 | Attending: Cardiovascular Disease

## 2015-12-14 ENCOUNTER — Other Ambulatory Visit (HOSPITAL_COMMUNITY): Payer: Self-pay

## 2015-12-14 DIAGNOSIS — Z951 Presence of aortocoronary bypass graft: Secondary | ICD-10-CM | POA: Diagnosis not present

## 2015-12-14 DIAGNOSIS — Z720 Tobacco use: Secondary | ICD-10-CM | POA: Insufficient documentation

## 2015-12-14 DIAGNOSIS — I119 Hypertensive heart disease without heart failure: Secondary | ICD-10-CM | POA: Insufficient documentation

## 2015-12-14 DIAGNOSIS — I2510 Atherosclerotic heart disease of native coronary artery without angina pectoris: Secondary | ICD-10-CM | POA: Diagnosis not present

## 2015-12-14 NOTE — Telephone Encounter (Signed)
Ok with me 

## 2015-12-15 NOTE — Telephone Encounter (Signed)
completed

## 2015-12-15 NOTE — Telephone Encounter (Signed)
Spoke with Rachel Bo and he is going to contact [facility] and get an appointment set up with one of their providers.

## 2015-12-19 ENCOUNTER — Telehealth: Payer: Self-pay | Admitting: *Deleted

## 2015-12-19 DIAGNOSIS — E785 Hyperlipidemia, unspecified: Secondary | ICD-10-CM

## 2015-12-19 MED ORDER — EZETIMIBE 10 MG PO TABS: 10.0000 mg | ORAL_TABLET | Freq: Every day | ORAL | 12 refills | Status: DC

## 2015-12-19 NOTE — Telephone Encounter (Signed)
-----   Message from Lelon Perla, MD sent at 11/30/2015  7:37 AM EDT ----- zetia 10 mg daily; lipids and liver 4 weeks Kirk Ruths

## 2015-12-19 NOTE — Telephone Encounter (Signed)
Spoke with pt, Aware of dr crenshaw's recommendations.  ?New script sent to the pharmacy  ?Lab orders mailed to the pt  ?

## 2016-01-11 ENCOUNTER — Other Ambulatory Visit: Payer: Self-pay | Admitting: Cardiology

## 2016-01-19 ENCOUNTER — Encounter: Payer: 59 | Admitting: Family

## 2016-02-14 ENCOUNTER — Encounter: Payer: Self-pay | Admitting: Family

## 2016-02-14 ENCOUNTER — Ambulatory Visit (INDEPENDENT_AMBULATORY_CARE_PROVIDER_SITE_OTHER): Payer: 59 | Admitting: Family

## 2016-02-14 VITALS — BP 138/84 | HR 58 | Temp 97.9°F | Resp 16 | Ht 70.0 in | Wt 235.0 lb

## 2016-02-14 DIAGNOSIS — Z87891 Personal history of nicotine dependence: Secondary | ICD-10-CM | POA: Diagnosis not present

## 2016-02-14 DIAGNOSIS — Z23 Encounter for immunization: Secondary | ICD-10-CM

## 2016-02-14 DIAGNOSIS — Z0000 Encounter for general adult medical examination without abnormal findings: Secondary | ICD-10-CM

## 2016-02-14 NOTE — Assessment & Plan Note (Signed)
1) Anticipatory Guidance: Discussed importance of wearing a seatbelt while driving and not texting while driving; changing batteries in smoke detector at least once annually; wearing suntan lotion when outside; eating a balanced and moderate diet; getting physical activity at least 30 minutes per day.  2) Immunizations / Screenings / Labs:  Influenza updated today. All other immunizations are up-to-date per recommendations. Due for a vision screen encouraged to be completed independently. Obtain hepatitis C antibody for hepatitis C screening. Obtain PSA for prostate cancer screening. Referral placed to low dose lung cancer screening secondary to previous smoking history. All other screenings are up-to-date per recommendations. Obtain CBC, CMET, and lipid profile.    Overall well exam with risk factors for cardiovascular disease including hyperlipidemia, hypertension, obesity and coronary atherosclerosis. Blood pressure appears adequately controlled with current regimen and no adverse side effects. Recommend weight loss of 5-10% current body weight through nutrition and physical activity. Continue other healthy lifestyle behaviors and choices. Follow-up prevention exam in 1 year. Up office visit for chronic conditions pending blood work.

## 2016-02-14 NOTE — Patient Instructions (Signed)
Thank you for choosing [facility].  SUMMARY AND INSTRUCTIONS:  Medication:  Please continue take your medications as prescribed.   Your prescription(s) have been submitted to your pharmacy or been printed and provided for you. Please take as directed and contact our office if you believe you are having problem(s) with the medication(s) or have any questions.  Labs:  Please stop by the lab on the lower level of the building for your blood work. Your results will be released to [place] (or called to you) after review, usually within 72 hours after test completion. If any changes need to be made, you will be notified at that same time.  1.) The lab is open from 7:30am to 5:30 pm Monday-Friday 2.) No appointment is necessary 3.) Fasting (if needed) is 6-8 hours after food and drink; black coffee and water are okay   Follow up:  If your symptoms worsen or fail to improve, please contact our office for further instruction, or in case of emergency go directly to the emergency room at the closest medical facility.    Health Maintenance, Male A healthy lifestyle and preventative care can promote health and wellness.  Maintain regular health, dental, and eye exams.  Eat a healthy diet. Foods like vegetables, fruits, whole grains, low-fat dairy products, and lean protein foods contain the nutrients you need and are low in calories. Decrease your intake of foods high in solid fats, added sugars, and salt. Get information about a proper diet from your health care provider, if necessary.  Regular physical exercise is one of the most important things you can do for your health. Most adults should get at least 150 minutes of moderate-intensity exercise (any activity that increases your heart rate and causes you to sweat) each week. In addition, most adults need muscle-strengthening exercises on 2 or more days a week.   Maintain a healthy weight. The body mass index (BMI) is a screening tool  to identify possible weight problems. It provides an estimate of body fat based on height and weight. Your health care provider can find your BMI and can help you achieve or maintain a healthy weight. For males 20 years and older:  A BMI below 18.5 is considered underweight.  A BMI of 18.5 to 24.9 is normal.  A BMI of 25 to 29.9 is considered overweight.  A BMI of 30 and above is considered obese.  Maintain normal blood lipids and cholesterol by exercising and minimizing your intake of saturated fat. Eat a balanced diet with plenty of fruits and vegetables. Blood tests for lipids and cholesterol should begin at age 51 and be repeated every 5 years. If your lipid or cholesterol levels are high, you are over age 1, or you are at high risk for heart disease, you may need your cholesterol levels checked more frequently.Ongoing high lipid and cholesterol levels should be treated with medicines if diet and exercise are not working.  If you smoke, find out from your health care provider how to quit. If you do not use tobacco, do not start.  Lung cancer screening is recommended for adults aged 3-80 years who are at high risk for developing lung cancer because of a history of smoking. A yearly low-dose CT scan of the lungs is recommended for people who have at least a 30-pack-year history of smoking and are current smokers or have quit within the past 15 years. A pack year of smoking is smoking an average of 1 pack of cigarettes a  day for 1 year (for example, a 30-pack-year history of smoking could mean smoking 1 pack a day for 30 years or 2 packs a day for 15 years). Yearly screening should continue until the smoker has stopped smoking for at least 15 years. Yearly screening should be stopped for people who develop a health problem that would prevent them from having lung cancer treatment.  If you choose to drink alcohol, do not have more than 2 drinks per day. One drink is considered to be 12 oz (360 mL)  of beer, 5 oz (150 mL) of wine, or 1.5 oz (45 mL) of liquor.  Avoid the use of street drugs. Do not share needles with anyone. Ask for help if you need support or instructions about stopping the use of drugs.  High blood pressure causes heart disease and increases the risk of stroke. High blood pressure is more likely to develop in:  People who have blood pressure in the end of the normal range (100-139/85-89 mm Hg).  People who are overweight or obese.  People who are African American.  If you are 40-47 years of age, have your blood pressure checked every 3-5 years. If you are 52 years of age or older, have your blood pressure checked every year. You should have your blood pressure measured twice--once when you are at a hospital or clinic, and once when you are not at a hospital or clinic. Record the average of the two measurements. To check your blood pressure when you are not at a hospital or clinic, you can use:  An automated blood pressure machine at a pharmacy.  A home blood pressure monitor.  If you are 59-54 years old, ask your health care provider if you should take aspirin to prevent heart disease.  Diabetes screening involves taking a blood sample to check your fasting blood sugar level. This should be done once every 3 years after age 68 if you are at a normal weight and without risk factors for diabetes. Testing should be considered at a younger age or be carried out more frequently if you are overweight and have at least 1 risk factor for diabetes.  Colorectal cancer can be detected and often prevented. Most routine colorectal cancer screening begins at the age of 38 and continues through age 53. However, your health care provider may recommend screening at an earlier age if you have risk factors for colon cancer. On a yearly basis, your health care provider may provide home test kits to check for hidden blood in the stool. A small camera at the end of a tube may be used to  directly examine the colon (sigmoidoscopy or colonoscopy) to detect the earliest forms of colorectal cancer. Talk to your health care provider about this at age 25 when routine screening begins. A direct exam of the colon should be repeated every 5-10 years through age 39, unless early forms of precancerous polyps or small growths are found.  People who are at an increased risk for hepatitis B should be screened for this virus. You are considered at high risk for hepatitis B if:  You were born in a country where hepatitis B occurs often. Talk with your health care provider about which countries are considered high risk.  Your parents were born in a high-risk country and you have not received a shot to protect against hepatitis B (hepatitis B vaccine).  You have HIV or AIDS.  You use needles to inject street drugs.  You live  with, or have sex with, someone who has hepatitis B.  You are a man who has sex with other men (MSM).  You get hemodialysis treatment.  You take certain medicines for conditions like cancer, organ transplantation, and autoimmune conditions.  Hepatitis C blood testing is recommended for all people born from [birth year] through 1967 and any individual with known risk factors for hepatitis C.  Healthy men should no longer receive prostate-specific antigen (PSA) blood tests as part of routine cancer screening. Talk to your health care provider about prostate cancer screening.  Testicular cancer screening is not recommended for adolescents or adult males who have no symptoms. Screening includes self-exam, a health care provider exam, and other screening tests. Consult with your health care provider about any symptoms you have or any concerns you have about testicular cancer.  Practice safe sex. Use condoms and avoid high-risk sexual practices to reduce the spread of sexually transmitted infections (STIs).  You should be screened for STIs, including gonorrhea and chlamydia  if:  You are sexually active and are younger than 24 years.  You are older than 24 years, and your health care provider tells you that you are at risk for this type of infection.  Your sexual activity has changed since you were last screened, and you are at an increased risk for chlamydia or gonorrhea. Ask your health care provider if you are at risk.  If you are at risk of being infected with HIV, it is recommended that you take a prescription medicine daily to prevent HIV infection. This is called pre-exposure prophylaxis (PrEP). You are considered at risk if:  You are a man who has sex with other men (MSM).  You are a heterosexual man who is sexually active with multiple partners.  You take drugs by injection.  You are sexually active with a partner who has HIV.  Talk with your health care provider about whether you are at high risk of being infected with HIV. If you choose to begin PrEP, you should first be tested for HIV. You should then be tested every 3 months for as long as you are taking PrEP.  Use sunscreen. Apply sunscreen liberally and repeatedly throughout the day. You should seek shade when your shadow is shorter than you. Protect yourself by wearing long sleeves, pants, a wide-brimmed hat, and sunglasses year round whenever you are outdoors.  Tell your health care provider of new moles or changes in moles, especially if there is a change in shape or color. Also, tell your health care provider if a mole is larger than the size of a pencil eraser.  A one-time screening for abdominal aortic aneurysm (AAA) and surgical repair of large AAAs by ultrasound is recommended for men aged 6-75 years who are current or former smokers.  Stay current with your vaccines (immunizations).   This information is not intended to replace advice given to you by your health care provider. Make sure you discuss any questions you have with your health care provider.   Document Released:  11/03/2007 Document Revised: 05/28/2014 Document Reviewed: 10/02/2010 Elsevier Interactive Patient Education [employer].

## 2016-02-14 NOTE — Assessment & Plan Note (Signed)
Estimated 72-pack-year history and tobacco cessation within the last 15 years. He remains tobacco free at this time. Does meet qualifications for load dose CT scan for lung cancer screening.

## 2016-02-14 NOTE — Progress Notes (Signed)
Subjective:    Patient ID: Dylan Hudson, male    DOB: 10/03/1960, 55 y.o.   MRN: [id number]  Chief Complaint  Patient presents with  . Establish Care    CPE, not fasting    HPI:  Dylan Hudson is a 55 y.o. male who presents today for an annual wellness visit.   1) Health Maintenance -   Diet - Averages about 3 meals per day consisting of a heart healthy diet; Caffeine intake is rare. Rare fast/processed.   Exercise - Tries to walk 3-4 times per week for about 30 -45 minutes   2) Preventative Exams / Immunizations:  Dental -- Up to date  Vision -- Due for exam   Health Maintenance  Topic Date Due  . Hepatitis C Screening  11-Nov-1960  . HIV Screening  08/31/1973  . INFLUENZA VACCINE  12/20/2015  . COLONOSCOPY  03/07/2016  . TETANUS/TDAP  04/20/2018    Immunization History  Administered Date(s) Administered  . Influenza Split 02/01/2012  . Influenza Whole 04/06/2008, 05/16/2009, 01/29/2011  . Influenza,inj,Quad PF,36+ Mos 02/15/2014, 02/14/2016  . Influenza-Unspecified 04/20/2013  . Pneumococcal Polysaccharide-23 04/20/2008, 08/14/2013  . Td 04/20/2008     Allergies  Allergen Reactions  . Niacin And Related Other (See Comments)    Chest pain  . Pravachol [Pravastatin]     Myalgia   . Red Yeast Rice [Cholestin]     Jitters   . Zocor [Simvastatin]     REACTION: Myalgias     Outpatient Medications Prior to Visit  Medication Sig Dispense Refill  . aspirin 325 MG tablet Take 325 mg by mouth daily.    Marland Kitchen ezetimibe (ZETIA) 10 MG tablet Take 1 tablet (10 mg total) by mouth daily. 30 tablet 12  . fish oil-omega-3 fatty acids 1000 MG capsule Take 1 g by mouth 2 (two) times daily.      . fluticasone (FLONASE) 50 MCG/ACT nasal spray USE 2 SPRAYS INTO THE NOSE DAILY 16 g 1  . lisinopril (PRINIVIL,ZESTRIL) 10 MG tablet TAKE 1/2 TABLET (5 MG TOTAL) BY MOUTH DAILY. 30 tablet 6  . loratadine (CLARITIN) 10 MG tablet Take 10 mg by mouth daily as needed. For  allergies    . metoprolol succinate (TOPROL-XL) 50 MG 24 hr tablet TAKE 1 TABLET BY MOUTH DAILY - TAKE IMMEDIATELY FOLLOWING A MEAL 30 tablet 10  . pantoprazole (PROTONIX) 40 MG tablet Take 1 tablet (40 mg total) by mouth daily. 90 tablet 3   No facility-administered medications prior to visit.      Past Medical History:  Diagnosis Date  . Allergy    seasonal  . [place]    postoperative  . Benign positional vertigo   . CAD (coronary artery disease)    s/p CABG 01/07  . Diverticula of colon   . Esophageal stricture 08/03/2013  . Gilbert's syndrome    possible. (elevated bilirubin)  . Hemochromatosis   . Hx of adenomatous colonic polyps    [place]  . Hyperlipidemia   . Hypertension   . Internal hemorrhoid   . Memory loss   . Myocardial infarction ([place]) 05/2005  . Tobacco abuse   . Vertigo      Past Surgical History:  Procedure Laterality Date  . APPENDECTOMY  2014   lap appy  . COLONOSCOPY  2003, 2007, 02/2011   small adenomas  . CORONARY ARTERY BYPASS GRAFT    . ESOPHAGEAL DILATION  11/19/13  . LAPAROSCOPIC APPENDECTOMY N/A 08/06/2012   Procedure: APPENDECTOMY  LAPAROSCOPIC;  Surgeon: Imogene Burn. Georgette Dover, MD;  Location: [facility];  Service: General;  Laterality: N/A;  . left knee arthroscopy    . MEDIAN STERNOTOMY     for CABG x 4 (left internal mammary artery to distal left anterior descending coronary artery, right internal mammary artery to first circumflex marginal branch, saphenous vein graft to posterior descending coronary artery, saphenous vein graft to diagonal branch, endoscopic saphenous vein harvest from right thigh.) SURGEON: Valentina Gu. Roxy Manns, M.D. CHO/MEDQ D:06/08/05. T: 06/09/2005 Job: [id number]  . THERAPEUTIC PHLEBOTOMY  03/21/2015         Family History  Problem Relation Age of Onset  . Colon polyps Mother   . Colon cancer Paternal Uncle   . Heart attack Maternal Grandfather   . Breast cancer Neg Hx   . Prostate cancer Neg Hx      Social History   Social  History  . Marital status: Divorced    Spouse name: N/A  . Number of children: 1  . Years of education: 34   Occupational History  . Service Occupational psychologist   Social History Main Topics  . Smoking status: Former Smoker    Packs/day: 2.00    Years: 36.00    Types: Cigarettes    Quit date: 03/07/2006  . Smokeless tobacco: Never Used     Comment: smoked since age 46 approx. 1 pact to 1 and a half packs a day. fortunately after his bypass surgery he completely discontinued smoking  . Alcohol use 2.4 oz/week    4 Cans of beer per week     Comment: socially  . Drug use: No  . Sexual activity: Not on file   Other Topics Concern  . Not on file   Social History Narrative   Lives with girlfriend. Fishing is a hobby.    Fun: Go to the lake every weekend.        Review of Systems  Constitutional: Denies fever, chills, fatigue, or significant weight gain/loss. HENT: Head: Denies headache or neck pain Ears: Denies changes in hearing, ringing in ears, earache, drainage Nose: Denies discharge, stuffiness, itching, nosebleed, sinus pain Throat: Denies sore throat, hoarseness, dry mouth, sores, thrush Eyes: Denies loss/changes in vision, pain, redness, blurry/double vision, flashing lights Cardiovascular: Denies chest pain/discomfort, tightness, palpitations, shortness of breath with activity, difficulty lying down, swelling, sudden awakening with shortness of breath Respiratory: Denies shortness of breath, cough, sputum production, wheezing Gastrointestinal: Denies dysphasia, heartburn, change in appetite, nausea, change in bowel habits, rectal bleeding, constipation, diarrhea, yellow skin or eyes Genitourinary: Denies frequency, urgency, burning/pain, blood in urine, incontinence, change in urinary strength. Musculoskeletal: Denies muscle/joint pain, stiffness, back pain, redness or swelling of joints, trauma Skin: Denies rashes, lumps, itching, dryness, color changes,  or hair/nail changes Neurological: Denies dizziness, fainting, seizures, weakness, numbness, tingling, tremor Psychiatric - Denies nervousness, stress, depression or memory loss Endocrine: Denies heat or cold intolerance, sweating, frequent urination, excessive thirst, changes in appetite Hematologic: Denies ease of bruising or bleeding     Objective:     BP 138/84 (BP Location: Left Arm, Patient Position: Sitting, Cuff Size: Large)   Pulse (!) 58   Temp 97.9 F (36.6 C) (Oral)   Resp 16   Ht 5\' 10"  (1.778 m)   Wt 235 lb (106.6 kg)   SpO2 98%   BMI 33.72 kg/m  Nursing note and vital signs reviewed.  Physical Exam  Constitutional: He is oriented to person, place, and time.  He appears well-developed and well-nourished.  HENT:  Head: Normocephalic.  Right Ear: Hearing, tympanic membrane, external ear and ear canal normal.  Left Ear: Hearing, tympanic membrane, external ear and ear canal normal.  Nose: Nose normal.  Mouth/Throat: Uvula is midline, oropharynx is clear and moist and mucous membranes are normal.  Eyes: Conjunctivae and EOM are normal. Pupils are equal, round, and reactive to light.  Neck: Neck supple. No JVD present. No tracheal deviation present. No thyromegaly present.  Cardiovascular: Normal rate, regular rhythm, normal heart sounds and intact distal pulses.   Pulmonary/Chest: Effort normal and breath sounds normal.  Abdominal: Soft. Bowel sounds are normal. He exhibits no distension and no mass. There is no tenderness. There is no rebound and no guarding.  Musculoskeletal: Normal range of motion. He exhibits no edema or tenderness.  Lymphadenopathy:    He has no cervical adenopathy.  Neurological: He is alert and oriented to person, place, and time. He has normal reflexes. No cranial nerve deficit. He exhibits normal muscle tone. Coordination normal.  Skin: Skin is warm and dry.  Psychiatric: He has a normal mood and affect. His behavior is normal. Judgment and  thought content normal.       Assessment & Plan:   Problem List Items Addressed This Visit      Other   Routine general medical examination at a health care facility - Primary    1) Anticipatory Guidance: Discussed importance of wearing a seatbelt while driving and not texting while driving; changing batteries in smoke detector at least once annually; wearing suntan lotion when outside; eating a balanced and moderate diet; getting physical activity at least 30 minutes per day.  2) Immunizations / Screenings / Labs:  Influenza updated today. All other immunizations are up-to-date per recommendations. Due for a vision screen encouraged to be completed independently. Obtain hepatitis C antibody for hepatitis C screening. Obtain PSA for prostate cancer screening. Referral placed to low dose lung cancer screening secondary to previous smoking history. All other screenings are up-to-date per recommendations. Obtain CBC, CMET, and lipid profile.    Overall well exam with risk factors for cardiovascular disease including hyperlipidemia, hypertension, obesity and coronary atherosclerosis. Blood pressure appears adequately controlled with current regimen and no adverse side effects. Recommend weight loss of 5-10% current body weight through nutrition and physical activity. Continue other healthy lifestyle behaviors and choices. Follow-up prevention exam in 1 year. Up office visit for chronic conditions pending blood work.       History of nicotine dependence    Estimated 72-pack-year history and tobacco cessation within the last 15 years. He remains tobacco free at this time. Does meet qualifications for load dose CT scan for lung cancer screening.       Other Visit Diagnoses   None.      I am having Mr. Zirkelbach maintain his loratadine, fish oil-omega-3 fatty acids, aspirin, fluticasone, lisinopril, pantoprazole, ezetimibe, metoprolol succinate, and ferrous sulfate.   Meds ordered this  encounter  Medications  . ferrous sulfate 325 (65 FE) MG tablet    Sig: Take 325 mg by mouth every other day.     Follow-up: Return if symptoms worsen or fail to improve.   Mauricio Po, FNP

## 2016-02-17 ENCOUNTER — Other Ambulatory Visit: Payer: Self-pay | Admitting: *Deleted

## 2016-02-17 MED ORDER — LISINOPRIL 10 MG PO TABS: ORAL_TABLET | ORAL | 2 refills | Status: DC

## 2016-02-29 ENCOUNTER — Encounter: Payer: Self-pay | Admitting: Internal Medicine

## 2016-06-29 ENCOUNTER — Other Ambulatory Visit: Payer: Self-pay | Admitting: Acute Care

## 2016-06-29 DIAGNOSIS — Z87891 Personal history of nicotine dependence: Secondary | ICD-10-CM

## 2016-07-04 ENCOUNTER — Ambulatory Visit (INDEPENDENT_AMBULATORY_CARE_PROVIDER_SITE_OTHER)
Admission: RE | Admit: 2016-07-04 | Discharge: 2016-07-04 | Disposition: A | Discharge status: Home / Self Care | Payer: 59 | Source: Ambulatory Visit | Attending: Acute Care | Admitting: Acute Care

## 2016-07-04 ENCOUNTER — Telehealth: Payer: Self-pay | Admitting: Acute Care

## 2016-07-04 ENCOUNTER — Encounter: Payer: Self-pay | Admitting: Acute Care

## 2016-07-04 ENCOUNTER — Ambulatory Visit (INDEPENDENT_AMBULATORY_CARE_PROVIDER_SITE_OTHER): Payer: 59 | Admitting: Acute Care

## 2016-07-04 DIAGNOSIS — Z87891 Personal history of nicotine dependence: Secondary | ICD-10-CM | POA: Diagnosis not present

## 2016-07-04 NOTE — Telephone Encounter (Signed)
I have called [name] with the results of his low-dose screening CT. His scan was read as a lung RADS 2, indicating nodules that are benign in appearance and behavior. Recommendation is for continued annual screening with low-dose CT in 12 months. We will order and schedule scan for February 2019. Additional findings noted on the exam included aortic atherosclerosis and cholelithiasis. The patient does have a history of elevated cholesterol, however was intolerant of statin therapy. I will fax a copy of the results of this exam to his primary care physician Dr. Augusto Garbe for evaluation and follow-up as he feels is clinically indicated. The patient verbalized understanding of the above and had no further questions regarding the results of his scan. Contact information in the event he has questions in the future.

## 2016-07-04 NOTE — Progress Notes (Signed)
Shared Decision Making Visit Lung Cancer Screening Program [phone])   Eligibility:  Age 56 y.o.  Pack Years Smoking History Calculation 45 pack-year smoking history (# packs/per year x # years smoked)  Recent History of coughing up blood  no  Unexplained weight loss? no ( >Than 15 pounds within the last 6 months )  Prior History Lung / other cancer no (Diagnosis within the last 5 years already requiring surveillance chest CT Scans).  Smoking Status Former Smoker  Former Smokers: Years since quit: 10 years  Quit Date: 03/07/2006  Visit Components:  Discussion included one or more decision making aids. yes  Discussion included risk/benefits of screening. yes  Discussion included potential follow up diagnostic testing for abnormal scans. yes  Discussion included meaning and risk of over diagnosis. yes  Discussion included meaning and risk of False Positives. yes  Discussion included meaning of total radiation exposure. yes  Counseling Included:  Importance of adherence to annual lung cancer LDCT screening. yes  Impact of comorbidities on ability to participate in the program. yes  Ability and willingness to under diagnostic treatment. yes  Smoking Cessation Counseling:  Current Smokers:   Discussed importance of smoking cessation. NA  Information about tobacco cessation classes and interventions provided to patient. yes  Patient provided with "ticket" for LDCT Scan. yes  Symptomatic Patient. no  Counseling: NA  Diagnosis Code: Tobacco Use Z72.0  Asymptomatic Patient yes  Counseling NA: former smoker  Former Smokers:   Discussed the importance of maintaining cigarette abstinence. yes  Diagnosis Code: Personal History of Nicotine Dependence. [id number]  Information about tobacco cessation classes and interventions provided to patient. Yes  Patient provided with "ticket" for LDCT Scan. yes  Written Order for Lung Cancer Screening with LDCT placed in  Epic. Yes (CT Chest Lung Cancer Screening Low Dose W/O CM) [id number] Z12.2-Screening of respiratory organs Z87.891-Personal history of nicotine dependence  I spent 25 minutes of face to face time with Mr. Haggart discussing the risks and benefits of lung cancer screening. We viewed a power point together that explained in detail the above noted topics. We took the time to pause the power point at intervals to allow for questions to be asked and answered to ensure understanding. We discussed that he had taken the single most powerful action possible to decrease his risk of developing lung cancer when he quit smoking. I counseled him to remain smoke free, and to contact me if he ever had the desire to smoke again so that I can provide resources and tools to help support the effort to remain smoke free. We discussed the time and location of the scan, and that either [place] or I will call with the results within  24-48 hours of receiving them. Mr. Starwalt has my card and contact information in the event he needs to speak with me, in addition to a copy of the power point we reviewed as a resource. History Tonks verbalized understanding of all of the above and had no further questions upon leaving the office.   I also discussed with Mr. Juergensen that we are finding of a high percentage of coronary artery disease on these scans. I explained that this is a non-gated exam, meaning that degree or severity of disease cannot be determined. He explained that he has had quadruple bypass surgery and is already aware that he has coronary artery disease. He has failed statin therapy due to muscle aches and cramps. He is followed by Dr. Stanford Breed, cardiologist.  Magdalen Spatz, NP 07/04/2016

## 2016-11-26 NOTE — Progress Notes (Signed)
HPI: FU coronary artery disease. In 2007, the patient had cardiac catheterization that showed an ejection fraction of 45% with severe anterior apical hypokinesis and severe coronary disease. He ultimately underwent coronary artery bypassing graft on June 04, 2005. At that time, he had a saphenous vein graft to the PDA, status vein graft to the diagonal, a RIMA to the OM1, and a LIMA to the LAD. Myoview in March of 2013 showed an ejection fraction of 68%. There was prior infarct but no ischemia. Also with h/o hemchromatosis. Echo 7/17 showed normal LV function, mild LAE. Since I last saw him, the patient denies any dyspnea on exertion, orthopnea, PND, pedal edema, palpitations, syncope or chest pain.   Current Outpatient Prescriptions  Medication Sig Dispense Refill  . aspirin 325 MG tablet Take 325 mg by mouth daily.    . fish oil-omega-3 fatty acids 1000 MG capsule Take 1 g by mouth 2 (two) times daily.      . fluticasone (FLONASE) 50 MCG/ACT nasal spray USE 2 SPRAYS INTO THE NOSE DAILY 16 g 1  . lisinopril (PRINIVIL,ZESTRIL) 10 MG tablet TAKE 1/2 TABLET (5MG ) BY MOUTH DAILY 15 tablet 6  . loratadine (CLARITIN) 10 MG tablet Take 10 mg by mouth daily as needed. For allergies    . metoprolol succinate (TOPROL-XL) 50 MG 24 hr tablet TAKE 1 TABLET BY MOUTH DAILY - TAKE IMMEDIATELY FOLLOWING A MEAL 30 tablet 10  . pantoprazole (PROTONIX) 40 MG tablet TAKE ONE TABLET BY MOUTH DAILY 30 tablet 2  . ezetimibe (ZETIA) 10 MG tablet Take 1 tablet (10 mg total) by mouth daily. 30 tablet 12   No current facility-administered medications for this visit.      Past Medical History:  Diagnosis Date  . Allergy    seasonal  . Amenia    postoperative  . Benign positional vertigo   . CAD (coronary artery disease)    s/p CABG 01/07  . Diverticula of colon   . Esophageal stricture 08/03/2013  . Gilbert's syndrome    possible. (elevated bilirubin)  . Hemochromatosis   . Hx of adenomatous colonic  polyps    Medoff  . Hyperlipidemia   . Hypertension   . Internal hemorrhoid   . Memory loss   . Myocardial infarction ([place]) 05/2005  . Tobacco abuse   . Vertigo     Past Surgical History:  Procedure Laterality Date  . APPENDECTOMY  2014   lap appy  . COLONOSCOPY  2003, 2007, 02/2011   small adenomas  . CORONARY ARTERY BYPASS GRAFT    . ESOPHAGEAL DILATION  11/19/13  . LAPAROSCOPIC APPENDECTOMY N/A 08/06/2012   Procedure: APPENDECTOMY LAPAROSCOPIC;  Surgeon: Imogene Burn. Georgette Dover, MD;  Location: [place];  Service: General;  Laterality: N/A;  . left knee arthroscopy    . MEDIAN STERNOTOMY     for CABG x 4 (left internal mammary artery to distal left anterior descending coronary artery, right internal mammary artery to first circumflex marginal branch, saphenous vein graft to posterior descending coronary artery, saphenous vein graft to diagonal branch, endoscopic saphenous vein harvest from right thigh.) SURGEON: Valentina Gu. Roxy Manns, M.D. CHO/MEDQ D:06/08/05. T: 06/09/2005 Job: [id number]  . THERAPEUTIC PHLEBOTOMY  03/21/2015        Social History   Social History  . Marital status: Divorced    Spouse name: N/A  . Number of children: 1  . Years of education: 19   Occupational History  . Therapist, nutritional  dishwashing leasing   Social History Main Topics  . Smoking status: Former Smoker    Packs/day: 2.00    Years: 36.00    Types: Cigarettes    Quit date: 03/07/2006  . Smokeless tobacco: Never Used     Comment: smoked since age 53 approx. 1 pact to 1 and a half packs a day. fortunately after his bypass surgery he completely discontinued smoking  . Alcohol use 2.4 oz/week    4 Cans of beer per week     Comment: socially  . Drug use: No  . Sexual activity: Not on file   Other Topics Concern  . Not on file   Social History Narrative   Lives with girlfriend. Fishing is a hobby.    Fun: Go to the lake every weekend.        Family History  Problem Relation Age of Onset  . Colon  polyps Mother   . Colon cancer Paternal Uncle   . Heart attack Maternal Grandfather   . Breast cancer Neg Hx   . Prostate cancer Neg Hx     ROS: no fevers or chills, productive cough, hemoptysis, dysphasia, odynophagia, melena, hematochezia, dysuria, hematuria, rash, seizure activity, orthopnea, PND, pedal edema, claudication. Remaining systems are negative.  Physical Exam: Well-developed well-nourished in no acute distress.  Skin is warm and dry.  HEENT is normal.  Neck is supple.  Chest is clear to auscultation with normal expansion.  Cardiovascular exam is regular rate and rhythm.  Abdominal exam nontender or distended. No masses palpated. Extremities show no edema. neuro grossly intact  ECG- sinus rhythm at a rate of 67. No ST changes. personally reviewed  A/P  1 coronary artery disease-continue aspirin. Patient is intolerant to statins.  2 hypertension-blood pressure controlled. Continue present medications. Check potassium and renal function.  3 hyperlipidemia-patient has not tolerated statins previously. Continue Zetia. Check lipids and liver. If LDL elevated would refer to lipid clinic for consideration of repatha.     Kirk Ruths, MD

## 2016-11-29 ENCOUNTER — Other Ambulatory Visit: Payer: Self-pay | Admitting: Cardiology

## 2016-11-29 ENCOUNTER — Other Ambulatory Visit: Payer: Self-pay | Admitting: Internal Medicine

## 2016-12-06 ENCOUNTER — Ambulatory Visit (INDEPENDENT_AMBULATORY_CARE_PROVIDER_SITE_OTHER): Payer: 59 | Admitting: Cardiology

## 2016-12-06 ENCOUNTER — Encounter: Payer: Self-pay | Admitting: Cardiology

## 2016-12-06 VITALS — BP 122/84 | HR 67 | Ht 70.0 in | Wt 229.0 lb

## 2016-12-06 DIAGNOSIS — E7800 Pure hypercholesterolemia, unspecified: Secondary | ICD-10-CM | POA: Diagnosis not present

## 2016-12-06 DIAGNOSIS — I10 Essential (primary) hypertension: Secondary | ICD-10-CM | POA: Diagnosis not present

## 2016-12-06 DIAGNOSIS — I2510 Atherosclerotic heart disease of native coronary artery without angina pectoris: Secondary | ICD-10-CM

## 2016-12-06 LAB — COMPREHENSIVE METABOLIC PANEL
A/G RATIO: 2.1 (ref 1.2–2.2)
ALT: 62 IU/L — ABNORMAL HIGH (ref 0–44)
AST: 37 IU/L (ref 0–40)
Albumin: 4.6 g/dL (ref 3.5–5.5)
Alkaline Phosphatase: 52 IU/L (ref 39–117)
BUN/Creatinine Ratio: 10 (ref 9–20)
BUN: 11 mg/dL (ref 6–24)
Bilirubin Total: 1.7 mg/dL — ABNORMAL HIGH (ref 0.0–1.2)
CALCIUM: 9.8 mg/dL (ref 8.7–10.2)
CO2: 23 mmol/L (ref 20–29)
Chloride: 99 mmol/L (ref 96–106)
Creatinine, Ser: 1.07 mg/dL (ref 0.76–1.27)
GFR, EST AFRICAN AMERICAN: 88 mL/min/{1.73_m2} (ref >59)
GFR, EST NON AFRICAN AMERICAN: 76 mL/min/{1.73_m2} (ref >59)
GLOBULIN, TOTAL: 2.2 g/dL (ref 1.5–4.5)
Glucose: 94 mg/dL (ref 65–99)
POTASSIUM: 4.3 mmol/L (ref 3.5–5.2)
Sodium: 140 mmol/L (ref 134–144)
Total Protein: 6.8 g/dL (ref 6.0–8.5)

## 2016-12-06 LAB — LIPID PANEL
CHOLESTEROL TOTAL: 201 mg/dL — AB (ref 100–199)
Chol/HDL Ratio: 5.6 ratio — ABNORMAL HIGH (ref 0.0–5.0)
HDL: 36 mg/dL — ABNORMAL LOW (ref >39)
LDL CALC: 131 mg/dL — AB (ref 0–99)
TRIGLYCERIDES: 171 mg/dL — AB (ref 0–149)
VLDL Cholesterol Cal: 34 mg/dL (ref 5–40)

## 2016-12-06 NOTE — Patient Instructions (Signed)
Medication Instructions:   NO CHANGE  Labwork:  Your physician recommends that you return for lab work WHEN FASTING  Follow-Up:  Your physician wants you to follow-up in: ONE YEAR WITH DR CRENSHAW You will receive a reminder letter in the mail two months in advance. If you don't receive a letter, please call our office to schedule the follow-up appointment.   If you need a refill on your cardiac medications before your next appointment, please call your pharmacy.    

## 2016-12-11 ENCOUNTER — Telehealth: Payer: Self-pay | Admitting: Cardiology

## 2016-12-11 NOTE — Telephone Encounter (Signed)
Called patient and LVM to call me back to schedule lipid clinic appointment.

## 2016-12-13 ENCOUNTER — Telehealth: Payer: Self-pay | Admitting: Cardiology

## 2016-12-13 NOTE — Telephone Encounter (Signed)
Called the patient and left a VM to call me back to schedule his lipid clinic appointment.

## 2016-12-18 ENCOUNTER — Telehealth: Payer: Self-pay | Admitting: Cardiology

## 2016-12-18 NOTE — Telephone Encounter (Signed)
Called the patient and LVM to call me back to schedule his lipid appointment.

## 2016-12-24 ENCOUNTER — Other Ambulatory Visit: Payer: Self-pay | Admitting: Cardiology

## 2016-12-24 ENCOUNTER — Telehealth: Payer: Self-pay | Admitting: Cardiology

## 2016-12-24 NOTE — Telephone Encounter (Signed)
Called the patient and left a VM for the patient to call me back and schedule his lipid appt.

## 2017-01-14 ENCOUNTER — Other Ambulatory Visit: Payer: Self-pay | Admitting: Cardiology

## 2017-01-14 NOTE — Telephone Encounter (Signed)
Rx request sent to pharmacy.  

## 2017-02-14 ENCOUNTER — Encounter: Payer: Self-pay | Admitting: Pharmacist Clinician (PhC)/ Clinical Pharmacy Specialist

## 2017-02-14 ENCOUNTER — Ambulatory Visit (INDEPENDENT_AMBULATORY_CARE_PROVIDER_SITE_OTHER): Payer: 59 | Admitting: Pharmacist Clinician (PhC)/ Clinical Pharmacy Specialist

## 2017-02-14 ENCOUNTER — Other Ambulatory Visit: Payer: Self-pay | Admitting: Pharmacist Clinician (PhC)/ Clinical Pharmacy Specialist

## 2017-02-14 DIAGNOSIS — E7800 Pure hypercholesterolemia, unspecified: Secondary | ICD-10-CM

## 2017-02-14 MED ORDER — EVOLOCUMAB 140 MG/ML ~~LOC~~ SOAJ: 140.0000 mg | SUBCUTANEOUS | 12 refills | Status: DC

## 2017-02-14 NOTE — Assessment & Plan Note (Signed)
Patient with history of CABG x 4 at age 56 and unable to tolerate multiple statin drugs due to myalgias.  Will start him on Repatha 140 mg and file paperwork with his insurance company.  He was encouraged to continue with healthy lifestyle habits.

## 2017-02-14 NOTE — Progress Notes (Signed)
02/14/2017 Dylan Hudson Oct 16, 1960 [id number]   HPI:  Dylan Hudson is a 56 y.o. male patient of Dr Stanford Breed, who presents today for a lipid clinic evaluation.  His medical history is significant for CABG x 4 back in 2007 when he was only 44.  He also has GIlbert's syndrome and hypertension.  He has done well since the bypass surgery and has no complaints or concerns at this time.    Current Medications:  Ezetimibe 10 mg daily  Cholesterol Goals:   LDL < 70  Intolerant/previously tried:  All meds caused him to have muscle weakness and joint pain in his hips and knees  Atorvastatin 10 (Sept 2013-June 2014)  Atorvastatin 20 (June 2014)  Pitavastatin 2 (July 2014-May 2015)  Pravastatin 40 (May 2015-Nov 2015)  Pitavastatin 2 (Nov 2015-Feb 2016)  Family history:   MGM - first MI at 14,  all 60 of her brothers died before 34 from MI  MGF - died at 66 from MI  Mother doing well  1 brother/2 sisters, no problems  1 son 63 w/o problems  Diet:   Mix of home and restaurant food; no added salt; no fried foods; mostly chicken; doesn't eat many vegetables;  Exercise:    Walk 30-45 minutes daily; stays active at home  Social history:  Quit smoking in '07 with CABG (3 ppd); no alcohol; very little caffeine (maybe 1 soda per week)  Labs:   Results for Dylan Hudson, Dylan Hudson (MRN [id number]) as of 02/14/2017 08:22  Ref. Range 08/02/2010 18:17 02/02/2011 09:13 11/13/2012 09:15 08/14/2013 14:02 02/15/2014 10:36 11/29/2015 08:57 12/06/2016 00:00  Cholesterol Latest Ref Range: 125 - 200 mg/dL 187 176 208 (H) 154 233 (H) 195   Cholesterol, Total Latest Ref Range: 100 - 199 mg/dL       201 (H)  HDL Cholesterol Latest Ref Range: >39 mg/dL 35 (L) 34.50 (L) 36.30 (L) 31 (L) 31.70 (L) 26 (L) 36 (L)  LDL (calc) Latest Ref Range: 0 - 99 mg/dL 101 (H) 114 (H)  98  126 131 (H)  Direct LDL Latest Units: mg/dL   138.6  159.7    NonHDL Unknown     201.30    Triglycerides Latest Ref Range: 0 - 149 mg/dL 256 (H) 136.0 208.0  (H) 123 226.0 (H) 217 (H) 171 (H)     Current Outpatient Prescriptions  Medication Sig Dispense Refill  . aspirin 325 MG tablet Take 325 mg by mouth daily.    Marland Kitchen ezetimibe (ZETIA) 10 MG tablet TAKE ONE TABLET BY MOUTH DAILY 30 tablet 11  . fish oil-omega-3 fatty acids 1000 MG capsule Take 1 g by mouth 2 (two) times daily.      . fluticasone (FLONASE) 50 MCG/ACT nasal spray USE 2 SPRAYS INTO THE NOSE DAILY 16 g 1  . lisinopril (PRINIVIL,ZESTRIL) 10 MG tablet TAKE 1/2 TABLET (5MG ) BY MOUTH DAILY 15 tablet 6  . loratadine (CLARITIN) 10 MG tablet Take 10 mg by mouth daily as needed. For allergies    . metoprolol succinate (TOPROL-XL) 50 MG 24 hr tablet TAKE 1 TABLET BY MOUTH DAILY - TAKE IMMEDIATELY FOLLOWING A MEAL 30 tablet 9  . pantoprazole (PROTONIX) 40 MG tablet TAKE ONE TABLET BY MOUTH DAILY 30 tablet 2   No current facility-administered medications for this visit.     Allergies  Allergen Reactions  . Niacin And Related Other (See Comments)    Chest pain  . Pravachol [Pravastatin]     Myalgia   .  Red Yeast Rice [Cholestin]     Jitters   . Zocor [Simvastatin]     REACTION: Myalgias    Past Medical History:  Diagnosis Date  . Allergy    seasonal  . Amenia    postoperative  . Benign positional vertigo   . CAD (coronary artery disease)    s/p CABG 01/07  . Diverticula of colon   . Esophageal stricture 08/03/2013  . Gilbert's syndrome    possible. (elevated bilirubin)  . Hemochromatosis   . Hx of adenomatous colonic polyps    Medoff  . Hyperlipidemia   . Hypertension   . Internal hemorrhoid   . Memory loss   . Myocardial infarction ([place]) 05/2005  . Tobacco abuse   . Vertigo     There were no vitals taken for this visit.   Hyperlipidemia Patient with history of CABG x 4 at age 7 and unable to tolerate multiple statin drugs due to myalgias.  Will start him on Repatha 140 mg and file paperwork with his insurance company.  He was encouraged to continue with healthy  lifestyle habits.   Tommy Medal PharmD CPP [facility]

## 2017-02-14 NOTE — Patient Instructions (Signed)
Evolocumab injection What is this medicine? EVOLOCUMAB (e voe LOK ue mab) is known as a PCSK9 inhibitor. It is used to lower the level of cholesterol in the blood. It may be used alone or in combination with other cholesterol-lowering drugs. This drug may also be used to reduce the risk of heart attack, stroke, and certain types of heart surgery in patients with heart disease. This medicine may be used for other purposes; ask your health care provider or pharmacist if you have questions. COMMON BRAND NAME(S): REPATHA What should I tell my health care provider before I take this medicine? They need to know if you have any of these conditions: -an unusual or allergic reaction to evolocumab, other medicines, foods, dyes, or preservatives -pregnant or trying to get pregnant -breast-feeding How should I use this medicine? This medicine is for injection under the skin. You will be taught how to prepare and give this medicine. Use exactly as directed. Take your medicine at regular intervals. Do not take your medicine more often than directed. It is important that you put your used needles and syringes in a special sharps container. Do not put them in a trash can. If you do not have a sharps container, call your pharmacist or health care provider to get one. Talk to your pediatrician regarding the use of this medicine in children. While this drug may be prescribed for children as young as 13 years for selected conditions, precautions do apply. Overdosage: If you think you have taken too much of this medicine contact a poison control center or emergency room at once. NOTE: This medicine is only for you. Do not share this medicine with others. What if I miss a dose? If you miss a dose, take it as soon as you can if there are more than 7 days until the next scheduled dose, or skip the missed dose and take the next dose according to your original schedule. Do not take double or extra doses. What may interact  with this medicine? Interactions are not expected. This list may not describe all possible interactions. Give your health care provider a list of all the medicines, herbs, non-prescription drugs, or dietary supplements you use. Also tell them if you smoke, drink alcohol, or use illegal drugs. Some items may interact with your medicine. What should I watch for while using this medicine? You may need blood work while you are taking this medicine. What side effects may I notice from receiving this medicine? Side effects that you should report to your doctor or health care professional as soon as possible: -allergic reactions like skin rash, itching or hives, swelling of the face, lips, or tongue -signs and symptoms of infection like fever or chills; cough; sore throat; pain or trouble passing urine Side effects that usually do not require medical attention (report to your doctor or health care professional if they continue or are bothersome): -diarrhea -nausea -muscle pain -pain, redness, or irritation at site where injected This list may not describe all possible side effects. Call your doctor for medical advice about side effects. You may report side effects to FDA at 1-800-FDA-1088. Where should I keep my medicine? Keep out of the reach of children. You will be instructed on how to store this medicine. Throw away any unused medicine after the expiration date on the label. NOTE: This sheet is a summary. It may not cover all possible information. If you have questions about this medicine, talk to your doctor, pharmacist, or health care   provider.  2018 Elsevier/Gold Standard (2016-04-23 13:21:53)  

## 2017-02-28 ENCOUNTER — Other Ambulatory Visit: Payer: Self-pay | Admitting: Internal Medicine

## 2017-03-12 ENCOUNTER — Other Ambulatory Visit: Payer: Self-pay | Admitting: Pharmacist Clinician (PhC)/ Clinical Pharmacy Specialist

## 2017-03-12 MED ORDER — ALIROCUMAB 150 MG/ML ~~LOC~~ SOPN: 150.0000 mg | PEN_INJECTOR | SUBCUTANEOUS | 12 refills | Status: DC

## 2017-03-12 NOTE — Telephone Encounter (Signed)
Insurance preference for [employer].  Rx signed and faxed to [facility]

## 2017-03-19 ENCOUNTER — Telehealth: Payer: Self-pay | Admitting: Pharmacist Clinician (PhC)/ Clinical Pharmacy Specialist

## 2017-03-19 MED ORDER — ROSUVASTATIN CALCIUM 5 MG PO TABS: ORAL_TABLET | ORAL | 3 refills | Status: DC

## 2017-03-19 NOTE — Telephone Encounter (Signed)
Patient was denied PCSK-9i therapy (Praluent preferred) because he had not failed a high intensity statin for 2 or more weeks.  (He failed moderate dose statins multiple times).    Reviewed options with patient - CLEAR trial vs rosuvastatin 5 mg weekly.   Patient agreeable to try rosuvastatin 5 mg weekly, will send rx and touch base with him after 6 weeks to see if tolerating.

## 2017-06-11 ENCOUNTER — Telehealth: Payer: Self-pay | Admitting: Pharmacist Clinician (PhC)/ Clinical Pharmacy Specialist

## 2017-06-11 NOTE — Telephone Encounter (Signed)
LMOM for patient to return call.  Need to determine if he is still taking crestor and how frequently.  Will need to repeat lipid labs.

## 2017-07-04 ENCOUNTER — Other Ambulatory Visit: Payer: Self-pay | Admitting: Cardiology

## 2017-07-04 NOTE — Telephone Encounter (Signed)
REFILL 

## 2017-07-05 ENCOUNTER — Ambulatory Visit (INDEPENDENT_AMBULATORY_CARE_PROVIDER_SITE_OTHER)
Admission: RE | Admit: 2017-07-05 | Discharge: 2017-07-05 | Disposition: A | Discharge status: Home / Self Care | Payer: 59 | Source: Ambulatory Visit | Attending: Acute Care | Admitting: Acute Care

## 2017-07-05 DIAGNOSIS — Z87891 Personal history of nicotine dependence: Secondary | ICD-10-CM | POA: Diagnosis not present

## 2017-07-09 ENCOUNTER — Other Ambulatory Visit: Payer: Self-pay | Admitting: Acute Care

## 2017-07-09 DIAGNOSIS — Z122 Encounter for screening for malignant neoplasm of respiratory organs: Secondary | ICD-10-CM

## 2017-07-09 DIAGNOSIS — Z87891 Personal history of nicotine dependence: Secondary | ICD-10-CM

## 2017-07-20 ENCOUNTER — Other Ambulatory Visit: Payer: Self-pay | Admitting: Cardiology

## 2017-09-12 ENCOUNTER — Other Ambulatory Visit: Payer: Self-pay | Admitting: Internal Medicine

## 2017-09-30 ENCOUNTER — Other Ambulatory Visit: Payer: Self-pay | Admitting: Internal Medicine

## 2017-10-28 ENCOUNTER — Encounter: Payer: Self-pay | Admitting: Family

## 2017-10-28 ENCOUNTER — Other Ambulatory Visit (INDEPENDENT_AMBULATORY_CARE_PROVIDER_SITE_OTHER): Payer: 59

## 2017-10-28 ENCOUNTER — Ambulatory Visit: Payer: 59 | Admitting: Family

## 2017-10-28 VITALS — BP 124/82 | HR 58 | Temp 98.0°F | Ht 70.0 in | Wt 231.1 lb

## 2017-10-28 DIAGNOSIS — E7800 Pure hypercholesterolemia, unspecified: Secondary | ICD-10-CM

## 2017-10-28 DIAGNOSIS — Z1159 Encounter for screening for other viral diseases: Secondary | ICD-10-CM

## 2017-10-28 DIAGNOSIS — Z125 Encounter for screening for malignant neoplasm of prostate: Secondary | ICD-10-CM | POA: Diagnosis not present

## 2017-10-28 DIAGNOSIS — Z1211 Encounter for screening for malignant neoplasm of colon: Secondary | ICD-10-CM | POA: Diagnosis not present

## 2017-10-28 DIAGNOSIS — Z23 Encounter for immunization: Secondary | ICD-10-CM | POA: Diagnosis not present

## 2017-10-28 DIAGNOSIS — K219 Gastro-esophageal reflux disease without esophagitis: Secondary | ICD-10-CM | POA: Diagnosis not present

## 2017-10-28 DIAGNOSIS — Z0000 Encounter for general adult medical examination without abnormal findings: Secondary | ICD-10-CM | POA: Diagnosis not present

## 2017-10-28 LAB — CBC WITH DIFFERENTIAL/PLATELET
BASOS ABS: 0.1 10*3/uL (ref 0.0–0.1)
Basophils Relative: 1.2 % (ref 0.0–3.0)
EOS ABS: 0.2 10*3/uL (ref 0.0–0.7)
Eosinophils Relative: 2.9 % (ref 0.0–5.0)
HEMATOCRIT: 47.9 % (ref 39.0–52.0)
Hemoglobin: 17 g/dL (ref 13.0–17.0)
LYMPHS PCT: 21.3 % (ref 12.0–46.0)
Lymphs Abs: 1.3 10*3/uL (ref 0.7–4.0)
MCHC: 35.5 g/dL (ref 30.0–36.0)
MCV: 93.6 fl (ref 78.0–100.0)
MONO ABS: 0.7 10*3/uL (ref 0.1–1.0)
Monocytes Relative: 11.7 % (ref 3.0–12.0)
Neutro Abs: 3.9 10*3/uL (ref 1.4–7.7)
Neutrophils Relative %: 62.9 % (ref 43.0–77.0)
Platelets: 168 10*3/uL (ref 150.0–400.0)
RBC: 5.12 Mil/uL (ref 4.22–5.81)
RDW: 13.2 % (ref 11.5–15.5)
WBC: 6.2 10*3/uL (ref 4.0–10.5)

## 2017-10-28 LAB — COMPREHENSIVE METABOLIC PANEL
ALT: 39 U/L (ref 0–53)
AST: 26 U/L (ref 0–37)
Albumin: 4.4 g/dL (ref 3.5–5.2)
Alkaline Phosphatase: 47 U/L (ref 39–117)
BUN: 12 mg/dL (ref 6–23)
CALCIUM: 9.5 mg/dL (ref 8.4–10.5)
CHLORIDE: 101 meq/L (ref 96–112)
CO2: 28 meq/L (ref 19–32)
CREATININE: 1.02 mg/dL (ref 0.40–1.50)
GFR: 79.41 mL/min (ref >60.00)
Glucose, Bld: 107 mg/dL — ABNORMAL HIGH (ref 70–99)
POTASSIUM: 4 meq/L (ref 3.5–5.1)
Sodium: 138 mEq/L (ref 135–145)
Total Bilirubin: 2 mg/dL — ABNORMAL HIGH (ref 0.2–1.2)
Total Protein: 6.8 g/dL (ref 6.0–8.3)

## 2017-10-28 LAB — PSA: PSA: 0.37 ng/mL (ref 0.10–4.00)

## 2017-10-28 LAB — MAGNESIUM: Magnesium: 2.1 mg/dL (ref 1.5–2.5)

## 2017-10-28 NOTE — Addendum Note (Signed)
Addended by: Marcina Millard on: 10/28/2017 10:25 AM   Modules accepted: Orders

## 2017-10-28 NOTE — Progress Notes (Signed)
Dylan Hudson is a 57 y.o. male with the following history as recorded in Epic[employer]:  Patient Active Problem List   Diagnosis Date Noted  . History of nicotine dependence 02/14/2016  . Routine general medical examination at a health care facility 08/14/2013  . Esophageal stricture 08/03/2013  . Hyperglycemia 08/13/2012  . S/P appendectomy 08/06/2012  . Rhinitis 07/28/2011  . LIBIDO, DECREASED 08/02/2010  . Hereditary hemochromatosis ([place]) 10/05/2009  . [place] SYNDROME 10/05/2009  . MEMORY LOSS 11/14/2007  . Coronary atherosclerosis 07/14/2007  . ADENOMATOUS COLONIC POLYPS, HX OF 07/14/2007  . Hyperlipidemia 03/28/2007  . Essential hypertension 03/28/2007    Current Outpatient Medications  Medication Sig Dispense Refill  . aspirin 325 MG tablet Take 325 mg by mouth daily.    Marland Kitchen ezetimibe (ZETIA) 10 MG tablet TAKE ONE TABLET BY MOUTH DAILY 30 tablet 11  . fish oil-omega-3 fatty acids 1000 MG capsule Take 1 g by mouth 2 (two) times daily.      . fluticasone (FLONASE) 50 MCG/ACT nasal spray USE 2 SPRAYS INTO THE NOSE DAILY 16 g 1  . lisinopril (PRINIVIL,ZESTRIL) 10 MG tablet TAKE ONE-HALF TABLET BY MOUTH DAILY  (MUST KEEP OFFICE VISIT) 15 tablet 5  . loratadine (CLARITIN) 10 MG tablet Take 10 mg by mouth daily as needed. For allergies    . metoprolol succinate (TOPROL-XL) 50 MG 24 hr tablet TAKE 1 TABLET BY MOUTH DAILY - TAKE IMMEDIATELY FOLLOWING A MEAL 30 tablet 9  . pantoprazole (PROTONIX) 40 MG tablet TAKE ONE TABLET BY MOUTH DAILY 30 tablet 0  . rosuvastatin (CRESTOR) 5 MG tablet TAKE 1 TABLET BY MOUTH ONCE TO THREE TIMES PER WEEK AS TOLERATED 12 tablet 6   No current facility-administered medications for this visit.     Allergies: Niacin and related; Pravachol [pravastatin]; Red yeast rice [cholestin]; and Zocor [simvastatin]  Past Medical History:  Diagnosis Date  . Allergy    seasonal  . Amenia    postoperative  . Benign positional vertigo   . CAD (coronary artery  disease)    s/p CABG 01/07  . Diverticula of colon   . Esophageal stricture 08/03/2013  . Gilbert's syndrome    possible. (elevated bilirubin)  . Hemochromatosis   . Hx of adenomatous colonic polyps    Medoff  . Hyperlipidemia   . Hypertension   . Internal hemorrhoid   . Memory loss   . Myocardial infarction ([place]) 05/2005  . Tobacco abuse   . Vertigo     Past Surgical History:  Procedure Laterality Date  . APPENDECTOMY  2014   lap appy  . COLONOSCOPY  2003, 2007, 02/2011   small adenomas  . CORONARY ARTERY BYPASS GRAFT    . ESOPHAGEAL DILATION  11/19/13  . LAPAROSCOPIC APPENDECTOMY N/A 08/06/2012   Procedure: APPENDECTOMY LAPAROSCOPIC;  Surgeon: Imogene Burn. Georgette Dover, MD;  Location: [facility];  Service: General;  Laterality: N/A;  . left knee arthroscopy    . MEDIAN STERNOTOMY     for CABG x 4 (left internal mammary artery to distal left anterior descending coronary artery, right internal mammary artery to first circumflex marginal branch, saphenous vein graft to posterior descending coronary artery, saphenous vein graft to diagonal branch, endoscopic saphenous vein harvest from right thigh.) SURGEON: Valentina Gu. Roxy Manns, M.D. CHO/MEDQ D:06/08/05. T: 06/09/2005 Job: [id number]  . THERAPEUTIC PHLEBOTOMY  03/21/2015        Family History  Problem Relation Age of Onset  . Colon polyps Mother   . Colon cancer Paternal  Uncle   . Heart attack Maternal Grandfather   . Breast cancer Neg Hx   . Prostate cancer Neg Hx     Social History   Tobacco Use  . Smoking status: Former Smoker    Packs/day: 2.00    Years: 36.00    Pack years: 72.00    Types: Cigarettes    Last attempt to quit: 03/07/2006    Years since quitting: 11.6  . Smokeless tobacco: Never Used  . Tobacco comment: smoked since age 25 approx. 1 pact to 1 and a half packs a day. fortunately after his bypass surgery he completely discontinued smoking  Substance Use Topics  . Alcohol use: Yes    Alcohol/week: 2.4 oz    Types: 4 Cans  of beer per week    Comment: socially    Subjective:  Patient presents for yearly CPE; in baseline state of health today with no concerns; scheduled to see his cardiologist in July; seeing pulmonology regularly; has history of hemochromatosis- requesting to be under the care of hematology for further evaluation/ not currently having any type of blood drawn done regularly;   Review of Systems  Constitutional: Negative.   HENT: Negative.   Eyes: Negative.   Respiratory: Negative for shortness of breath and wheezing.   Cardiovascular: Negative for chest pain.  Gastrointestinal: Negative for heartburn.  Genitourinary: Negative.   Musculoskeletal: Negative.   Skin: Negative.   Neurological: Negative for dizziness, weakness and headaches.  Endo/Heme/Allergies: Negative.   Psychiatric/Behavioral: Negative for depression.     Objective:  Vitals:   10/28/17 0808  BP: 124/82  Pulse: (!) 58  Temp: 98 F (36.7 C)  TempSrc: Oral  SpO2: 97%  Weight: 231 lb 1.3 oz (104.8 kg)  Height: _0  (1.778 m)    General: Well developed, well nourished, in no acute distress  Skin : Warm and dry.  Head: Normocephalic and atraumatic  Eyes: Sclera and conjunctiva clear; pupils round and reactive to light; extraocular movements intact  Ears: External normal; canals clear; tympanic membranes normal  Oropharynx: Pink, supple. No suspicious lesions  Neck: Supple without thyromegaly, adenopathy  Lungs: Respirations unlabored; clear to auscultation bilaterally without wheeze, rales, rhonchi  CVS exam: normal rate and regular rhythm.  Abdomen: Soft; nontender; nondistended; normoactive bowel sounds; no masses or hepatosplenomegaly  Musculoskeletal: No deformities; no active joint inflammation  Extremities: No edema, cyanosis, clubbing  Vessels: Symmetric bilaterally  Neurologic: Alert and oriented; speech intact; face symmetrical; moves all extremities well; CNII-XII intact without focal deficit    Assessment:  1. PE (physical exam), annual   2. Encounter for screening colonoscopy   3. Hemochromatosis, unspecified hemochromatosis type   4. Prostate cancer screening   5. Need for hepatitis C screening test   6. Gastroesophageal reflux disease, esophagitis presence not specified   7. Pure hypercholesterolemia     Plan:  Labs updated; refills managed through specialists; age appropriate preventive healthcare needs updated; continue regular dental and vision exams; continue daily exercise; Tdap updated; patient notes he has had his Prevnar and will get that information for records here; referral for colonosocpy and to hematology; follow-up in 1 year, sooner prn based on lab results.    Return in about 1 year (around 10/29/2018), or if symptoms worsen or fail to improve.  Orders Placed This Encounter  Procedures  . CBC w/Diff    Standing Status:   Future    Number of Occurrences:   1    Standing Expiration Date:  10/28/2018  . Iron, TIBC and Ferritin Panel    Standing Status:   Future    Number of Occurrences:   1    Standing Expiration Date:   10/29/2018  . Comp Met (CMET)    Standing Status:   Future    Number of Occurrences:   1    Standing Expiration Date:   10/28/2018  . PSA    Standing Status:   Future    Number of Occurrences:   1    Standing Expiration Date:   10/28/2018  . Hepatitis C Antibody    Standing Status:   Future    Number of Occurrences:   1    Standing Expiration Date:   10/28/2018  . Magnesium    Standing Status:   Future    Number of Occurrences:   1    Standing Expiration Date:   10/28/2018  . Ambulatory referral to Gastroenterology    Referral Priority:   Routine    Referral Type:   Consultation    Referral Reason:   Specialty Services Required    Referred to Provider:   Gatha Mayer, MD    Number of Visits Requested:   1  . Ambulatory referral to Hematology    Referral Priority:   Routine    Referral Type:   Consultation    Referral Reason:    Specialty Services Required    Requested Specialty:   Oncology    Number of Visits Requested:   1    Requested Prescriptions    No prescriptions requested or ordered in this encounter

## 2017-10-29 ENCOUNTER — Telehealth: Payer: Self-pay | Admitting: Hematology

## 2017-10-29 ENCOUNTER — Encounter: Payer: Self-pay | Admitting: Hematology

## 2017-10-29 ENCOUNTER — Other Ambulatory Visit: Payer: Self-pay | Admitting: Cardiology

## 2017-10-29 LAB — HEPATITIS C ANTIBODY
Hepatitis C Ab: NONREACTIVE
SIGNAL TO CUT-OFF: 0.02 (ref <1.00)

## 2017-10-29 LAB — IRON,TIBC AND FERRITIN PANEL
%SAT: 23 % (ref 15–60)
FERRITIN: 28 ng/mL (ref 20–380)
Iron: 96 ug/dL (ref 50–180)
TIBC: 413 ug/dL (ref 250–425)

## 2017-10-29 NOTE — Telephone Encounter (Signed)
New referral received from Jodi Mourning, FNP for a dx of hemachromatosis. Pt has been scheduled to see Dr. Burr Medico on 6/25 at 245pm. Pt aware to arrive 30 minutes early to be checked in on time. Letter mailed.

## 2017-11-05 ENCOUNTER — Telehealth: Payer: Self-pay | Admitting: Hematology

## 2017-11-05 NOTE — Telephone Encounter (Signed)
Left the pt a vm to reschedule appt

## 2017-11-08 NOTE — Progress Notes (Signed)
[name]  [phone] [phone]) [facility] consult Note   Patient Care Team: Dylan Salvage, FNP as PCP - General (Internal Medicine) Gatha Mayer, MD as Consulting Physician (Gastroenterology) Lelon Perla, MD as Consulting Physician (Cardiology)   Date of Service:  11/12/2017  REFERRAL PHYSICIAN: Dylan Salvage, FNP  CHIEF COMPLAINTS/PURPOSE OF CONSULTATION:  H/o of hemochromatosis   HISTORY OF PRESENTING ILLNESS:   Dylan Hudson 57 y.o. male is a here because of history of hemochromatosis. The patient was referred by FNP Jodi Mourning. The patient presents to the clinic today by himself.   He notes he was never seen by hematologist before, he was diagnosed by Dr. Tasia Catchings several years ago. He notes he was being treated with phlebotomies every 2 months and initially had occasional syncope from this but managed well afterward. He recently had low iron and was started on oral iron replacement last year. He stopped oral iron after 2-3 weeks as this resolved.   Today the patient notes occasional bleeding from internal hemorrhoids.   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 He has 1 adult son. He stopped smoking in 2007 and drinks socially.   He has had open heart surgery in 05/2005 and is followed by cardiologist. He denies family history of hemochromatosis, liver disease or liver cancer. His paternal uncle had colon cancer.  He had EGDs in 2015 by Dr. Carlean Purl. His last colonoscopy was 2012.    MEDICAL HISTORY:  Past Medical History:  Diagnosis Date  . Allergy    seasonal  . Amenia    postoperative  . Benign positional vertigo   . CAD (coronary artery disease)    s/p CABG 01/07  . Diverticula of colon   . Esophageal stricture 08/03/2013  . Gilbert's syndrome    possible. (elevated bilirubin)  . Hemochromatosis   . Hx of adenomatous colonic polyps    Medoff  . Hyperlipidemia   . Hypertension   . Internal  hemorrhoid   . Memory loss   . Myocardial infarction ([place]) 05/2005  . Tobacco abuse   . Vertigo     SURGICAL HISTORY: Past Surgical History:  Procedure Laterality Date  . APPENDECTOMY  2014   lap appy  . COLONOSCOPY  2003, 2007, 02/2011   small adenomas  . CORONARY ARTERY BYPASS GRAFT    . ESOPHAGEAL DILATION  11/19/13  . LAPAROSCOPIC APPENDECTOMY N/A 08/06/2012   Procedure: APPENDECTOMY LAPAROSCOPIC;  Surgeon: Imogene Burn. Georgette Dover, MD;  Location: [facility];  Service: General;  Laterality: N/A;  . left knee arthroscopy    . MEDIAN STERNOTOMY     for CABG x 4 (left internal mammary artery to distal left anterior descending coronary artery, right internal mammary artery to first circumflex marginal branch, saphenous vein graft to posterior descending coronary artery, saphenous vein graft to diagonal branch, endoscopic saphenous vein harvest from right thigh.) SURGEON: Valentina Gu. Roxy Manns, M.D. CHO/MEDQ D:06/08/05. T: 06/09/2005 Job: [id number]  . THERAPEUTIC PHLEBOTOMY  03/21/2015        SOCIAL HISTORY: Social History   Socioeconomic History  . Marital status: Divorced    Spouse name: Not on file  . Number of children: 1  . Years of education: 72  . Highest education level: Not on file  Occupational History  . Occupation: Therapist, nutritional    Comment: Camera operator  Social Needs  . Financial resource strain: Not on file  . Food insecurity:    Worry: Not  on file    Inability: Not on file  . Transportation needs:    Medical: Not on file    Non-medical: Not on file  Tobacco Use  . Smoking status: Former Smoker    Packs/day: 2.00    Years: 36.00    Pack years: 72.00    Types: Cigarettes    Last attempt to quit: 03/07/2006    Years since quitting: 11.6  . Smokeless tobacco: Never Used  . Tobacco comment: smoked since age 87 approx. 1 pact to 1 and a half packs a day. fortunately after his bypass surgery he completely discontinued smoking  Substance and Sexual Activity  . Alcohol  use: Yes    Alcohol/week: 2.4 oz    Types: 4 Cans of beer per week    Comment: socially  . Drug use: No  . Sexual activity: Not on file  Lifestyle  . Physical activity:    Days per week: Not on file    Minutes per session: Not on file  . Stress: Not on file  Relationships  . Social connections:    Talks on phone: Not on file    Gets together: Not on file    Attends religious service: Not on file    Active member of club or organization: Not on file    Attends meetings of clubs or organizations: Not on file    Relationship status: Not on file  . Intimate partner violence:    Fear of current or ex partner: Not on file    Emotionally abused: Not on file    Physically abused: Not on file    Forced sexual activity: Not on file  Other Topics Concern  . Not on file  Social History Narrative   Lives with girlfriend. Fishing is a hobby.    Fun: Go to the lake every weekend.     FAMILY HISTORY: Family History  Problem Relation Age of Onset  . Colon polyps Mother   . Colon cancer Paternal Uncle   . Cancer Paternal Uncle        colon cancer  . Heart attack Maternal Grandfather   . Breast cancer Neg Hx   . Prostate cancer Neg Hx     ALLERGIES:  is allergic to niacin and related; pravachol [pravastatin]; red yeast rice [cholestin]; and zocor [simvastatin].  MEDICATIONS:  Current Outpatient Medications  Medication Sig Dispense Refill  . aspirin 325 MG tablet Take 325 mg by mouth daily.    Marland Kitchen ezetimibe (ZETIA) 10 MG tablet TAKE ONE TABLET BY MOUTH DAILY 30 tablet 11  . fish oil-omega-3 fatty acids 1000 MG capsule Take 1 g by mouth 2 (two) times daily.      . fluticasone (FLONASE) 50 MCG/ACT nasal spray USE 2 SPRAYS INTO THE NOSE DAILY 16 g 1  . lisinopril (PRINIVIL,ZESTRIL) 10 MG tablet TAKE ONE-HALF TABLET BY MOUTH DAILY  (MUST KEEP OFFICE VISIT) 15 tablet 5  . loratadine (CLARITIN) 10 MG tablet Take 10 mg by mouth daily as needed. For allergies    . metoprolol succinate  (TOPROL-XL) 50 MG 24 hr tablet TAKE 1 TABLET BY MOUTH DAILY - TAKE IMMEDIATELY FOLLOWING A MEAL 30 tablet 0  . pantoprazole (PROTONIX) 40 MG tablet TAKE ONE TABLET BY MOUTH DAILY 30 tablet 0  . rosuvastatin (CRESTOR) 5 MG tablet TAKE 1 TABLET BY MOUTH ONCE TO THREE TIMES PER WEEK AS TOLERATED 12 tablet 6   No current facility-administered medications for this visit.     REVIEW  OF SYSTEMS:    Constitutional: Denies fevers, chills or abnormal night sweats Eyes: Denies blurriness of vision, double vision or watery eyes Ears, nose, mouth, throat, and face: Denies mucositis or sore throat Respiratory: Denies cough, dyspnea or wheezes Cardiovascular: Denies palpitation, chest discomfort or lower extremity swelling Gastrointestinal:  Denies nausea, heartburn or change in bowel habits (+) occasional GI bleeding from internal hemorrhoids Skin: Denies abnormal skin rashes Lymphatics: Denies new lymphadenopathy or easy bruising Neurological:Denies numbness, tingling or new weaknesses Behavioral/Psych: Mood is stable, no new changes  All other systems were reviewed with the patient and are negative.  PHYSICAL EXAMINATION: ECOG PERFORMANCE STATUS: 0 - Asymptomatic  Vitals:   11/12/17 0931  BP: 137/88  Pulse: (!) 55  Resp: 18  Temp: 98.6 F (37 C)  SpO2: 98%   Filed Weights   11/12/17 0931  Weight: 237 lb 6.4 oz (107.7 kg)   GENERAL:alert, no distress and comfortable SKIN: skin color, texture, turgor are normal, no rashes or significant lesions EYES: normal, conjunctiva are pink and non-injected, sclera clear OROPHARYNX:no exudate, no erythema and lips, buccal mucosa, and tongue normal  NECK: supple, thyroid normal size, non-tender, without nodularity LYMPH:  no palpable lymphadenopathy in the cervical, axillary or inguinal LUNGS: clear to auscultation and percussion with normal breathing effort HEART: regular rate & rhythm and no murmurs and no lower extremity edema ABDOMEN:abdomen  soft, non-tender and normal bowel sounds Musculoskeletal:no cyanosis of digits and no clubbing  PSYCH: alert & oriented x 3 with fluent speech NEURO: no focal motor/sensory deficits  LABORATORY DATA:  I have reviewed the data as listed CBC Latest Ref Rng & Units 10/28/2017 11/24/2015 09/21/2015  WBC 4.0 - 10.5 K/uL 6.2 3.9(L) 4.2  Hemoglobin 13.0 - 17.0 g/dL 17.0 10.5(L) 10.0(L)  Hematocrit 39.0 - 52.0 % 47.9 33.4(L) 31.6(L)  Platelets 150.0 - 400.0 K/uL 168.0 198.0 198.0    CMP Latest Ref Rng & Units 10/28/2017 12/06/2016 11/29/2015  Glucose 70 - 99 mg/dL 107(H) 94 97  BUN 6 - 23 mg/dL 12 11 11   Creatinine 0.40 - 1.50 mg/dL 1.02 1.07 1.01  Sodium 135 - 145 mEq/L 138 140 142  Potassium 3.5 - 5.1 mEq/L 4.0 4.3 4.6  Chloride 96 - 112 mEq/L 101 99 103  CO2 19 - 32 mEq/L 28 23 23   Calcium 8.4 - 10.5 mg/dL 9.5 9.8 9.2  Total Protein 6.0 - 8.3 g/dL 6.8 6.8 6.6  Total Bilirubin 0.2 - 1.2 mg/dL 2.0(H) 1.7(H) 1.3(H)  Alkaline Phos 39 - 117 U/L 47 52 47  AST 0 - 37 U/L 26 37 33  ALT 0 - 53 U/L 39 62(H) 44     RADIOGRAPHIC STUDIES: I have personally reviewed the radiological images as listed and agreed with the findings in the report. No results found.  ASSESSMENT & PLAN:  GLENWOOD REVOIR is a 57 y.o. caucasian male with a history of CAD and open heart surgery.    1. H/o Hemochromatosis  -I reviewed his medical records including office notes and lab results, unfortunately his initial diagnosis of hemochromatosis including lab results was not available.  Per patient he was diagnosed more than 10 years ago, and he has been receiving phlebotomy since then.  His lab tests since 2014 have never showed high iron level and ferritin, and a low iron level and ferritin in the past year, likely secondary to phlebotomy.   Past and recent  labs which showed elevated iron levels since before 2013 and intermittent low iron in  the past 2  -He did take oral iron for 2 to 3 weeks for the iron deficiency, his  iron level has returned to normal now. -I discussed that lack of family history of hemachromatosis and recent lower iron levels makes me question if he truly has hemochromatosis. I will do HFE genetic test today. He is agreeable.  -If he has HFE mutation which can cause hemochromatosis, I recommend he gets his son tested as well. And I will monitor his iron and ferritin level every 3 months, and consider phlebotomy if he has ferritin more than 50 or transferrin saturation more than 50%. -F/u open based on lab   2. Cancer screenings -his last EGD was in 2015 and his last colonoscopy in 2012.  -I recommend he follow up with Dr. Carlean Purl as he is overdue for Colonosopy.  -I encouraged him to continue to follow up with PCP about   3.  Iron deficiency -Likely secondary to phlebotomy, he has no overt GI bleeding.  Previous colonoscopy was negative -Resolved after a short course of oral iron.  PLAN:  -Lab today to check HFE mutation, I will call him when result returns.  -F/u open    All questions were answered. The patient knows to call the clinic with any problems, questions or concerns. I spent 25 minutes counseling the patient face to face. The total time spent in the appointment was 30 minutes and more than 50% was on counseling.     Truitt Merle, MD 11/12/2017    I, Joslyn Devon, am acting as scribe for Truitt Merle, MD.   I have reviewed the above documentation for accuracy and completeness, and I agree with the above.

## 2017-11-12 ENCOUNTER — Encounter: Payer: Self-pay | Admitting: Hematology

## 2017-11-12 ENCOUNTER — Telehealth: Payer: Self-pay

## 2017-11-12 ENCOUNTER — Inpatient Hospital Stay: Payer: 59 | Attending: Hematology | Admitting: Hematology

## 2017-11-12 ENCOUNTER — Inpatient Hospital Stay: Payer: 59

## 2017-11-12 DIAGNOSIS — I2510 Atherosclerotic heart disease of native coronary artery without angina pectoris: Secondary | ICD-10-CM | POA: Insufficient documentation

## 2017-11-12 DIAGNOSIS — Z800 Family history of malignant neoplasm of digestive organs: Secondary | ICD-10-CM | POA: Diagnosis not present

## 2017-11-12 DIAGNOSIS — Z87891 Personal history of nicotine dependence: Secondary | ICD-10-CM | POA: Diagnosis not present

## 2017-11-12 DIAGNOSIS — Z8371 Family history of colonic polyps: Secondary | ICD-10-CM | POA: Insufficient documentation

## 2017-11-12 DIAGNOSIS — K649 Unspecified hemorrhoids: Secondary | ICD-10-CM | POA: Diagnosis not present

## 2017-11-12 DIAGNOSIS — Z951 Presence of aortocoronary bypass graft: Secondary | ICD-10-CM | POA: Insufficient documentation

## 2017-11-12 NOTE — Telephone Encounter (Signed)
Printed avs and calender of upcoming appointment. per 6/25 los

## 2017-11-16 LAB — HEMOCHROMATOSIS DNA-PCR(C282Y,H63D)

## 2017-11-22 ENCOUNTER — Telehealth: Payer: Self-pay

## 2017-11-22 ENCOUNTER — Telehealth: Payer: Self-pay | Admitting: Hematology

## 2017-11-22 NOTE — Telephone Encounter (Signed)
Per 7/5 sch msg already done

## 2017-11-22 NOTE — Telephone Encounter (Signed)
Appointments scheduled and LMVM for patient/ [place]/lettermailed to patient per 7/5 sch msg

## 2017-11-22 NOTE — Telephone Encounter (Signed)
Spoke with patient per Dr. Burr Medico with results that he does have a gene mutation that can potentially cause hemochromastosis but it is not the most common cause.  We will schedule hime for lab and f/u within 2 weeks to discuss further.  Scheduling message was sent.  Patient verbalized an understanding.

## 2017-11-22 NOTE — Telephone Encounter (Signed)
-----   Message from Truitt Merle, MD sent at 11/22/2017  9:11 AM EDT ----- Please let pt know that he does have a gene mutation which can potentially cause hemochromatosis, but not the most common one cause that. I will like to discuss with him further. Please schedule lab and f/u in the next 2 weeks, thanks   Truitt Merle  11/22/2017

## 2017-11-27 ENCOUNTER — Other Ambulatory Visit: Payer: Self-pay | Admitting: Cardiology

## 2017-12-05 NOTE — Progress Notes (Signed)
[place]  [phone] [phone]  Clinic Follow Up Note   Patient Care Team: Marrian Salvage, FNP as PCP - General (Internal Medicine) Gatha Mayer, MD as Consulting Physician (Gastroenterology) Lelon Perla, MD as Consulting Physician (Cardiology)   Date of Service:  12/06/2017  CHIEF COMPLAINTS:  F/u for hemochromatosis  HISTORY OF PRESENTING ILLNESS:   Dylan Hudson 57 y.o. male is a here because of history of hemochromatosis. The patient was referred by FNP Jodi Mourning. The patient presents to the clinic today by himself.   He notes he was never seen by hematologist before, he was diagnosed by Dr. Tasia Catchings several years ago. He notes he was being treated with phlebotomies every 2 months and initially had occasional syncope from this but managed well afterward. He recently had low iron and was started on oral iron replacement last year. He stopped oral iron after 2-3 weeks as this resolved.   Today the patient notes occasional bleeding from internal hemorrhoids.   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 He has 1 adult son. He stopped smoking in 2007 and drinks socially.   He has had open heart surgery in 05/2005 and is followed by cardiologist. He denies family history of hemochromatosis, liver disease or liver cancer. His paternal uncle had colon cancer.  He had EGDs in 2015 by Dr. Carlean Purl. His last colonoscopy was 2012.   CURRENT THERAPY: Phlebotomy as needed  INTERVAL HISTORY  Dylan Hudson is here for a follow up. He presents to the clinic today by himself. He notes he is doing well. He had hoped he did not have hemochromatosis but understands his diagnosis and what to do to maintain it. He will notify his children of their possibility to have this genetic mutation and for them to monitor their blood levels often.    MEDICAL HISTORY:  Past Medical History:  Diagnosis Date  . Allergy    seasonal  . Amenia    postoperative  . Benign positional vertigo   . CAD (coronary artery disease)    s/p CABG 01/07  . Diverticula of colon   . Esophageal stricture 08/03/2013  . Gilbert's syndrome    possible. (elevated bilirubin)  . Hemochromatosis   . Hx of adenomatous colonic polyps    Medoff  . Hyperlipidemia   . Hypertension   . Internal hemorrhoid   . Memory loss   . Myocardial infarction ([place]) 05/2005  . Tobacco abuse   . Vertigo     SURGICAL HISTORY: Past Surgical History:  Procedure Laterality Date  . APPENDECTOMY  2014   lap appy  . COLONOSCOPY  2003, 2007, 02/2011   small adenomas  . CORONARY ARTERY BYPASS GRAFT    . ESOPHAGEAL DILATION  11/19/13  . LAPAROSCOPIC APPENDECTOMY N/A 08/06/2012   Procedure: APPENDECTOMY LAPAROSCOPIC;  Surgeon: Imogene Burn. Georgette Dover, MD;  Location: [facility];  Service: General;  Laterality: N/A;  . left knee arthroscopy    . MEDIAN STERNOTOMY     for CABG x 4 (left internal mammary artery to distal left anterior descending coronary artery, right internal mammary artery to first circumflex marginal branch, saphenous vein graft to posterior descending coronary artery, saphenous vein graft to diagonal branch, endoscopic saphenous vein harvest from right thigh.) SURGEON: Valentina Gu. Roxy Manns, M.D. CHO/MEDQ D:06/08/05. T: 06/09/2005 Job: [id number]  . THERAPEUTIC PHLEBOTOMY  03/21/2015        SOCIAL HISTORY: Social History   Socioeconomic History  . Marital  status: Divorced    Spouse name: Not on file  . Number of children: 1  . Years of education: 73  . Highest education level: Not on file  Occupational History  . Occupation: Therapist, nutritional    Comment: Camera operator  Social Needs  . Financial resource strain: Not on file  . Food insecurity:    Worry: Not on file    Inability: Not on file  . Transportation needs:    Medical: Not on file    Non-medical: Not on file  Tobacco Use  . Smoking status: Former Smoker    Packs/day: 2.00    Years: 36.00    Pack  years: 72.00    Types: Cigarettes    Last attempt to quit: 03/07/2006    Years since quitting: 11.7  . Smokeless tobacco: Never Used  . Tobacco comment: smoked since age 35 approx. 1 pact to 1 and a half packs a day. fortunately after his bypass surgery he completely discontinued smoking  Substance and Sexual Activity  . Alcohol use: Yes    Alcohol/week: 2.4 oz    Types: 4 Cans of beer per week    Comment: socially  . Drug use: No  . Sexual activity: Not on file  Lifestyle  . Physical activity:    Days per week: Not on file    Minutes per session: Not on file  . Stress: Not on file  Relationships  . Social connections:    Talks on phone: Not on file    Gets together: Not on file    Attends religious service: Not on file    Active member of club or organization: Not on file    Attends meetings of clubs or organizations: Not on file    Relationship status: Not on file  . Intimate partner violence:    Fear of current or ex partner: Not on file    Emotionally abused: Not on file    Physically abused: Not on file    Forced sexual activity: Not on file  Other Topics Concern  . Not on file  Social History Narrative   Lives with girlfriend. Fishing is a hobby.    Fun: Go to the lake every weekend.     FAMILY HISTORY: Family History  Problem Relation Age of Onset  . Colon polyps Mother   . Colon cancer Paternal Uncle   . Cancer Paternal Uncle        colon cancer  . Heart attack Maternal Grandfather   . Breast cancer Neg Hx   . Prostate cancer Neg Hx     ALLERGIES:  is allergic to niacin and related; pravachol [pravastatin]; red yeast rice [cholestin]; and zocor [simvastatin].  MEDICATIONS:  Current Outpatient Medications  Medication Sig Dispense Refill  . aspirin 325 MG tablet Take 325 mg by mouth daily.    Marland Kitchen ezetimibe (ZETIA) 10 MG tablet TAKE ONE TABLET BY MOUTH DAILY 30 tablet 11  . fish oil-omega-3 fatty acids 1000 MG capsule Take 1 g by mouth 2 (two) times daily.       . fluticasone (FLONASE) 50 MCG/ACT nasal spray USE 2 SPRAYS INTO THE NOSE DAILY 16 g 1  . lisinopril (PRINIVIL,ZESTRIL) 10 MG tablet TAKE ONE-HALF TABLET BY MOUTH DAILY  (MUST KEEP OFFICE VISIT) 15 tablet 5  . loratadine (CLARITIN) 10 MG tablet Take 10 mg by mouth daily as needed. For allergies    . metoprolol succinate (TOPROL-XL) 50 MG 24 hr tablet TAKE ONE TABLET BY  MOUTH DAILY *TAKE IMMEDIATELY FOLLOWING A MEAL  **NEED OFFICE VISIT FOR REFILLS 30 tablet 0  . pantoprazole (PROTONIX) 40 MG tablet TAKE ONE TABLET BY MOUTH DAILY 30 tablet 0  . rosuvastatin (CRESTOR) 5 MG tablet TAKE 1 TABLET BY MOUTH ONCE TO THREE TIMES PER WEEK AS TOLERATED 12 tablet 6   No current facility-administered medications for this visit.     REVIEW OF SYSTEMS:    Constitutional: Denies fevers, chills or abnormal night sweats Eyes: Denies blurriness of vision, double vision or watery eyes Ears, nose, mouth, throat, and face: Denies mucositis or sore throat Respiratory: Denies cough, dyspnea or wheezes Cardiovascular: Denies palpitation, chest discomfort or lower extremity swelling Gastrointestinal:  Denies nausea, heartburn or change in bowel habits  Skin: Denies abnormal skin rashes Lymphatics: Denies new lymphadenopathy or easy bruising Neurological:Denies numbness, tingling or new weaknesses Behavioral/Psych: Mood is stable, no new changes  All other systems were reviewed with the patient and are negative.  PHYSICAL EXAMINATION: ECOG PERFORMANCE STATUS: 0 - Asymptomatic  Vitals:   12/06/17 1311  BP: 132/89  Pulse: (!) 49  Resp: 17  Temp: 97.9 F (36.6 C)  SpO2: 96%   Filed Weights   12/06/17 1311  Weight: 236 lb 6.4 oz (107.2 kg)     GENERAL:alert, no distress and comfortable SKIN: skin color, texture, turgor are normal, no rashes or significant lesions EYES: normal, conjunctiva are pink and non-injected, sclera clear OROPHARYNX:no exudate, no erythema and lips, buccal mucosa, and tongue  normal  NECK: supple, thyroid normal size, non-tender, without nodularity LYMPH:  no palpable lymphadenopathy in the cervical, axillary or inguinal LUNGS: clear to auscultation and percussion with normal breathing effort HEART: regular rate & rhythm and no murmurs and no lower extremity edema ABDOMEN:abdomen soft, non-tender and normal bowel sounds Musculoskeletal:no cyanosis of digits and no clubbing  PSYCH: alert & oriented x 3 with fluent speech NEURO: no focal motor/sensory deficits  LABORATORY DATA:  I have reviewed the data as listed CBC Latest Ref Rng & Units 12/06/2017 10/28/2017 11/24/2015  WBC 4.0 - 10.3 K/uL 6.5 6.2 3.9(L)  Hemoglobin 13.0 - 17.1 g/dL 15.7 17.0 10.5(L)  Hematocrit 38.4 - 49.9 % 45.4 47.9 33.4(L)  Platelets 140 - 400 K/uL 156 168.0 198.0    CMP Latest Ref Rng & Units 10/28/2017 12/06/2016 11/29/2015  Glucose 70 - 99 mg/dL 107(H) 94 97  BUN 6 - 23 mg/dL 12 11 11   Creatinine 0.40 - 1.50 mg/dL 1.02 1.07 1.01  Sodium 135 - 145 mEq/L 138 140 142  Potassium 3.5 - 5.1 mEq/L 4.0 4.3 4.6  Chloride 96 - 112 mEq/L 101 99 103  CO2 19 - 32 mEq/L 28 23 23   Calcium 8.4 - 10.5 mg/dL 9.5 9.8 9.2  Total Protein 6.0 - 8.3 g/dL 6.8 6.8 6.6  Total Bilirubin 0.2 - 1.2 mg/dL 2.0(H) 1.7(H) 1.3(H)  Alkaline Phos 39 - 117 U/L 47 52 47  AST 0 - 37 U/L 26 37 33  ALT 0 - 53 U/L 39 62(H) 44    PATHOLOGY   Date Received: 10/24/2009  FINAL DIAGNOSIS Microscopic Examination and Diagnosis 1. LIVER BX, RANDOM : - 3 TO 4+ HEPATOCYTE HEMOSIDERIN DEPOSITION SEE COMMENT - minimal portal inflammation. - trichrome stain negative for fibrosis. - incidental benign bile duct hamartoma - less than 5% steatosis DATE REPORTED: 10/28/2009  MICROSCOPIC DESCRIPTION 1. Given the known homozygous h63d hemachromatosis gene testing, the morphologic features are in keeping with hereditary hemochromatosis. Tissue will be submitted for iron quantitation  with the results to appear in an  addendum. The case was discussed with dr. Anda Kraft on 10/25/09. (Cr:Jy) 10/25/09 Addendum: Per [facility], the total iron concentration is 3,082 mcg/g dry weight (normal range: [phone]) with a hepatic iron index of 1.1 mcmol/g/yr (normal range: Less than 1.0). The results were relayed to dr Carlean Purl on 10/28/2009. (Cr:Jy) 10/28/09   RADIOGRAPHIC STUDIES: I have personally reviewed the radiological images as listed and agreed with the findings in the report. No results found.  ASSESSMENT & PLAN:  Dylan Hudson is a 57 y.o. caucasian male with a history of CAD and open heart surgery.    1. Hemochromatosis, HFE H63D homozygous -I previously reviewed his medical records including office notes and lab results -He underwent a liver biopsy in 2011, which showed 3-4+ hemosiderin deposition, confirmed hemochromatosis. -His HFE gene mutation test showed H63D/H63D which is not the most common but can cause hemochromatosis.  -I recommended continue phlebotomy, to keep ferritin less than 50 and iron saturation less than 50%, if no significant anemia. -I discussed that this is a inheritable disease, given he carries 2 gene mutations, his children will carry at least one gene mutation. I recommend his children get their hemoglobin levels and iron level checked and to avoid excessive iron uptake.  -Continue to monitor his iron and ferritin level every 3 months, and consider phlebotomy if he has ferritin more than 50 or transferrin saturation more than 50%. -F/u in 6 months    2. Cancer screenings -his last EGD was in 2015 and his last colonoscopy in 2012.  -I previously recommended he follow up with Dr. Carlean Purl as he is overdue for Colonoscopy.  -I encouraged him to continue to follow up with PCP   3.  Iron deficiency -Likely secondary to phlebotomy, he has no overt GI bleeding.  Previous colonoscopy was negative -Resolved after a short course of oral iron.  PLAN:  -Lab in 3  months, I will set up phlebotomy if ferritin >50 or iron saturation more than 50% -F/u I 6 months with lab one day before     All questions were answered. The patient knows to call the clinic with any problems, questions or concerns. I spent 15 minutes counseling the patient face to face. The total time spent in the appointment was 20 minutes and more than 50% was on counseling.     Truitt Merle, MD 12/06/2017  I, Joslyn Devon, am acting as scribe for Truitt Merle, MD.   I have reviewed the above documentation for accuracy and completeness, and I agree with the above.

## 2017-12-06 ENCOUNTER — Inpatient Hospital Stay: Payer: 59 | Admitting: Hematology

## 2017-12-06 ENCOUNTER — Telehealth: Payer: Self-pay

## 2017-12-06 ENCOUNTER — Inpatient Hospital Stay: Payer: 59 | Attending: Hematology

## 2017-12-06 ENCOUNTER — Other Ambulatory Visit: Payer: Self-pay | Admitting: Hematology

## 2017-12-06 DIAGNOSIS — Z87891 Personal history of nicotine dependence: Secondary | ICD-10-CM | POA: Insufficient documentation

## 2017-12-06 LAB — CBC WITH DIFFERENTIAL (CANCER CENTER ONLY)
BASOS ABS: 0 10*3/uL (ref 0.0–0.1)
BASOS PCT: 0 %
EOS ABS: 0.2 10*3/uL (ref 0.0–0.5)
Eosinophils Relative: 2 %
HCT: 45.4 % (ref 38.4–49.9)
Hemoglobin: 15.7 g/dL (ref 13.0–17.1)
Lymphocytes Relative: 27 %
Lymphs Abs: 1.7 10*3/uL (ref 0.9–3.3)
MCH: 32.6 pg (ref 27.2–33.4)
MCHC: 34.6 g/dL (ref 32.0–36.0)
MCV: 94.2 fL (ref 79.3–98.0)
MONO ABS: 0.9 10*3/uL (ref 0.1–0.9)
MONOS PCT: 13 %
Neutro Abs: 3.8 10*3/uL (ref 1.5–6.5)
Neutrophils Relative %: 58 %
Platelet Count: 156 10*3/uL (ref 140–400)
RBC: 4.82 MIL/uL (ref 4.20–5.82)
RDW: 12.8 % (ref 11.0–14.6)
WBC Count: 6.5 10*3/uL (ref 4.0–10.3)

## 2017-12-06 LAB — IRON AND TIBC
IRON: 80 ug/dL (ref 42–163)
Saturation Ratios: 20 % — ABNORMAL LOW (ref 42–163)
TIBC: 395 ug/dL (ref 202–409)
UIBC: 315 ug/dL

## 2017-12-06 LAB — FERRITIN: FERRITIN: 28 ng/mL (ref 24–336)

## 2017-12-06 NOTE — Telephone Encounter (Signed)
Printed avs and calender of upcoming appointment. Per 7/19 los 

## 2017-12-07 ENCOUNTER — Encounter: Payer: Self-pay | Admitting: Hematology

## 2017-12-09 ENCOUNTER — Telehealth: Payer: Self-pay

## 2017-12-09 NOTE — Telephone Encounter (Signed)
Called patient per Dr. Burr Medico to let him know iron level good, no need for phlebotomy at this time.

## 2017-12-09 NOTE — Telephone Encounter (Signed)
-----   Message from Truitt Merle, MD sent at 12/09/2017  7:52 AM EDT ----- Please let pt know that his iron level is normal. No phlebotomy needed, thanks  Truitt Merle  12/09/2017

## 2017-12-26 NOTE — Progress Notes (Signed)
HPI: FU coronary artery disease. In 2007, the patient had cardiac catheterization that showed an ejection fraction of 45% with severe anterior apical hypokinesis and severe coronary disease. He ultimately underwent coronary artery bypassing graft on June 04, 2005. At that time, he had a saphenous vein graft to the PDA, status vein graft to the diagonal, a RIMA to the OM1, and a LIMA to the LAD. Myoview in March of 2013 showed an ejection fraction of 68%. There was prior infarct but no ischemia. Also with h/o hemchromatosis. Echo 7/17 showed normal LV function, mild LAE. Since I last saw him, the patient denies any dyspnea on exertion, orthopnea, PND, pedal edema, palpitations, syncope or chest pain.   Current Outpatient Medications  Medication Sig Dispense Refill  . aspirin 325 MG tablet Take 325 mg by mouth daily.    Marland Kitchen ezetimibe (ZETIA) 10 MG tablet TAKE ONE TABLET BY MOUTH DAILY 30 tablet 11  . fish oil-omega-3 fatty acids 1000 MG capsule Take 1 g by mouth 2 (two) times daily.      . fluticasone (FLONASE) 50 MCG/ACT nasal spray USE 2 SPRAYS INTO THE NOSE DAILY 16 g 1  . lisinopril (PRINIVIL,ZESTRIL) 10 MG tablet TAKE ONE-HALF TABLET BY MOUTH DAILY  (MUST KEEP OFFICE VISIT) 15 tablet 5  . loratadine (CLARITIN) 10 MG tablet Take 10 mg by mouth daily as needed. For allergies    . metoprolol succinate (TOPROL-XL) 50 MG 24 hr tablet TAKE ONE TABLET BY MOUTH DAILY *TAKE IMMEDIATELY FOLLOWING A MEAL  **NEED OFFICE VISIT FOR REFILLS 30 tablet 0  . pantoprazole (PROTONIX) 40 MG tablet TAKE ONE TABLET BY MOUTH DAILY 30 tablet 0  . rosuvastatin (CRESTOR) 5 MG tablet TAKE 1 TABLET BY MOUTH ONCE TO THREE TIMES PER WEEK AS TOLERATED 12 tablet 6   No current facility-administered medications for this visit.      Past Medical History:  Diagnosis Date  . Allergy    seasonal  . Amenia    postoperative  . Benign positional vertigo   . CAD (coronary artery disease)    s/p CABG 01/07  .  Diverticula of colon   . Esophageal stricture 08/03/2013  . Gilbert's syndrome    possible. (elevated bilirubin)  . Hemochromatosis   . Hx of adenomatous colonic polyps    Medoff  . Hyperlipidemia   . Hypertension   . Internal hemorrhoid   . Memory loss   . Myocardial infarction ([place]) 05/2005  . Tobacco abuse   . Vertigo     Past Surgical History:  Procedure Laterality Date  . APPENDECTOMY  2014   lap appy  . COLONOSCOPY  2003, 2007, 02/2011   small adenomas  . CORONARY ARTERY BYPASS GRAFT    . ESOPHAGEAL DILATION  11/19/13  . LAPAROSCOPIC APPENDECTOMY N/A 08/06/2012   Procedure: APPENDECTOMY LAPAROSCOPIC;  Surgeon: Imogene Burn. Georgette Dover, MD;  Location: [facility];  Service: General;  Laterality: N/A;  . left knee arthroscopy    . MEDIAN STERNOTOMY     for CABG x 4 (left internal mammary artery to distal left anterior descending coronary artery, right internal mammary artery to first circumflex marginal branch, saphenous vein graft to posterior descending coronary artery, saphenous vein graft to diagonal branch, endoscopic saphenous vein harvest from right thigh.) SURGEON: Valentina Gu. Roxy Manns, M.D. CHO/MEDQ D:06/08/05. T: 06/09/2005 Job: [id number]  . THERAPEUTIC PHLEBOTOMY  03/21/2015        Social History   Socioeconomic History  . Marital status: Divorced  Spouse name: Not on file  . Number of children: 1  . Years of education: 66  . Highest education level: Not on file  Occupational History  . Occupation: Therapist, nutritional    Comment: Camera operator  Social Needs  . Financial resource strain: Not on file  . Food insecurity:    Worry: Not on file    Inability: Not on file  . Transportation needs:    Medical: Not on file    Non-medical: Not on file  Tobacco Use  . Smoking status: Former Smoker    Packs/day: 2.00    Years: 36.00    Pack years: 72.00    Types: Cigarettes    Last attempt to quit: 03/07/2006    Years since quitting: 11.8  . Smokeless tobacco: Never Used  .  Tobacco comment: smoked since age 70 approx. 1 pact to 1 and a half packs a day. fortunately after his bypass surgery he completely discontinued smoking  Substance and Sexual Activity  . Alcohol use: Yes    Alcohol/week: 4.0 standard drinks    Types: 4 Cans of beer per week    Comment: socially  . Drug use: No  . Sexual activity: Not on file  Lifestyle  . Physical activity:    Days per week: Not on file    Minutes per session: Not on file  . Stress: Not on file  Relationships  . Social connections:    Talks on phone: Not on file    Gets together: Not on file    Attends religious service: Not on file    Active member of club or organization: Not on file    Attends meetings of clubs or organizations: Not on file    Relationship status: Not on file  . Intimate partner violence:    Fear of current or ex partner: Not on file    Emotionally abused: Not on file    Physically abused: Not on file    Forced sexual activity: Not on file  Other Topics Concern  . Not on file  Social History Narrative   Lives with girlfriend. Fishing is a hobby.    Fun: Go to the lake every weekend.     Family History  Problem Relation Age of Onset  . Colon polyps Mother   . Colon cancer Paternal Uncle   . Cancer Paternal Uncle        colon cancer  . Heart attack Maternal Grandfather   . Breast cancer Neg Hx   . Prostate cancer Neg Hx     ROS: no fevers or chills, productive cough, hemoptysis, dysphasia, odynophagia, melena, hematochezia, dysuria, hematuria, rash, seizure activity, orthopnea, PND, pedal edema, claudication. Remaining systems are negative.  Physical Exam: Well-developed well-nourished in no acute distress.  Skin is warm and dry.  HEENT is normal.  Neck is supple.  Chest is clear to auscultation with normal expansion.  Cardiovascular exam is regular rate and rhythm.  Abdominal exam nontender or distended. No masses palpated. Extremities show no edema. neuro grossly  intact  ECG-sinus bradycardia at a rate of 44.  No ST changes.  Personally reviewed  A/P  1 coronary artery disease-patient denies chest pain.  Plan to continue medical therapy including aspirin (decrease to 81 mg daily) and low-dose statin.  He did not tolerate high-dose statins previously.  2 hypertension-blood pressure is controlled.  Laboratories from June 2019 reviewed and showed potassium 4.0, BUN 12 and creatinine 1.02.  3 hyperlipidemia-continue low-dose statin and  Zetia.  Check lipids.  Liver functions reviewed from June 2019 and normal with exception of elevated bilirubin at 2.0.  4 bradycardia-I will reduce Toprol to 25 mg daily and follow.  Kirk Ruths, MD

## 2017-12-30 ENCOUNTER — Ambulatory Visit: Payer: 59 | Admitting: Cardiology

## 2017-12-30 ENCOUNTER — Encounter: Payer: Self-pay | Admitting: Cardiology

## 2017-12-30 VITALS — BP 122/82 | HR 44 | Ht 72.0 in | Wt 234.0 lb

## 2017-12-30 DIAGNOSIS — I2510 Atherosclerotic heart disease of native coronary artery without angina pectoris: Secondary | ICD-10-CM | POA: Diagnosis not present

## 2017-12-30 DIAGNOSIS — E7800 Pure hypercholesterolemia, unspecified: Secondary | ICD-10-CM

## 2017-12-30 DIAGNOSIS — I10 Essential (primary) hypertension: Secondary | ICD-10-CM | POA: Diagnosis not present

## 2017-12-30 LAB — LIPID PANEL
CHOL/HDL RATIO: 5.3 ratio — AB (ref 0.0–5.0)
Cholesterol, Total: 190 mg/dL (ref 100–199)
HDL: 36 mg/dL — AB (ref >39)
LDL Calculated: 117 mg/dL — ABNORMAL HIGH (ref 0–99)
Triglycerides: 185 mg/dL — ABNORMAL HIGH (ref 0–149)
VLDL CHOLESTEROL CAL: 37 mg/dL (ref 5–40)

## 2017-12-30 MED ORDER — ASPIRIN 81 MG PO TABS: 81.0000 mg | ORAL_TABLET | Freq: Every day | ORAL | Status: AC

## 2017-12-30 MED ORDER — METOPROLOL SUCCINATE ER 25 MG PO TB24: 25.0000 mg | ORAL_TABLET | Freq: Every day | ORAL | 3 refills | Status: DC

## 2017-12-30 NOTE — Patient Instructions (Signed)
Medication Instructions:   DECREASE ASPIRIN TO 81 MG ONCE DAILY  DECREASE METOPROLOL TO 25 MG ONCE DAILY= 1/2 OF THE 50 MG TABLET ONCE DAILY  Labwork:  Your physician recommends that you HAVE LAB WORK TODAY  Follow-Up:  Your physician wants you to follow-up in: [place] will receive a reminder letter in the mail two months in advance. If you don't receive a letter, please call our office to schedule the follow-up appointment.   If you need a refill on your cardiac medications before your next appointment, please call your pharmacy.

## 2017-12-31 ENCOUNTER — Ambulatory Visit (INDEPENDENT_AMBULATORY_CARE_PROVIDER_SITE_OTHER): Payer: 59 | Admitting: Pharmacist

## 2017-12-31 ENCOUNTER — Other Ambulatory Visit: Payer: Self-pay

## 2017-12-31 ENCOUNTER — Other Ambulatory Visit: Payer: Self-pay | Admitting: Cardiology

## 2017-12-31 VITALS — BP 126/70 | HR 60

## 2017-12-31 DIAGNOSIS — E7800 Pure hypercholesterolemia, unspecified: Secondary | ICD-10-CM

## 2017-12-31 MED ORDER — LISINOPRIL 10 MG PO TABS: ORAL_TABLET | ORAL | 11 refills | Status: DC

## 2017-12-31 NOTE — Progress Notes (Signed)
HPI:  Dylan Hudson is a 57 y.o. male patient of Dr Stanford Breed, who presents today for a lipid clinic evaluation.  His medical history is significant for CABG x 4 back in 2007 when he was only 44.  He also has GIlbert's syndrome and hypertension.  He has done well since the bypass surgery and has no complaints or concerns at this time.  We tried to initiate PCSK9i last year for this patient but his insurance will not cover medication at the time. He presents today for potential PCSK9i initiation and counseling  Current Medications:  Rosuvastatin 5mg  3x/week  Ezetimibe 10 mg daily  Cholesterol Goals:   LDL < 70  Intolerant/previously tried:  All meds caused him to have muscle weakness and joint pain in his hips and knees  Atorvastatin 10 (Sept 2013-June 2014)  Atorvastatin 20 (June 2014)  Pitavastatin 2 (July 2014-May 2015)  Pravastatin 40 (May 2015-Nov 2015)  Pitavastatin 2 (Nov 2015-Feb 2016)  Family history:   MGM - first MI at 88,  all 72 of her brothers died before 32 from MI  MGF - died at 71 from MI  Mother doing well  1 brother/2 sisters, no problems  1 son 37 w/o problems  Diet:   Mix of home and restaurant food; no added salt; no fried foods; mostly chicken; doesn't eat many vegetables;  Exercise:    Walk 30-45 minutes daily; stays active at home  Social history:  Quit smoking in '07 with CABG (3 ppd); no alcohol; very little caffeine (maybe 1 soda per week)  Labs:  12/30/17: CHO 190; TG 185; HDL 36; LDL-c 117 mg/dL (rosuvastatin plus ezetimibe)   Current Outpatient Medications  Medication Sig Dispense Refill  . aspirin 81 MG tablet Take 1 tablet (81 mg total) by mouth daily.    Marland Kitchen ezetimibe (ZETIA) 10 MG tablet TAKE ONE TABLET BY MOUTH DAILY 30 tablet 11  . fish oil-omega-3 fatty acids 1000 MG capsule Take 1 g by mouth 2 (two) times daily.      . fluticasone (FLONASE) 50 MCG/ACT nasal spray USE 2 SPRAYS INTO THE NOSE DAILY 16 g 1  . lisinopril (PRINIVIL,ZESTRIL) 10 MG  tablet TAKE ONE-HALF TABLET BY MOUTH DAILY  (MUST KEEP OFFICE VISIT) 15 tablet 11  . loratadine (CLARITIN) 10 MG tablet Take 10 mg by mouth daily as needed. For allergies    . metoprolol succinate (TOPROL-XL) 25 MG 24 hr tablet Take 1 tablet (25 mg total) by mouth daily. Take with or immediately following a meal. 90 tablet 3  . pantoprazole (PROTONIX) 40 MG tablet TAKE ONE TABLET BY MOUTH DAILY 30 tablet 0  . rosuvastatin (CRESTOR) 5 MG tablet TAKE 1 TABLET BY MOUTH ONCE TO THREE TIMES PER WEEK AS TOLERATED 12 tablet 6   No current facility-administered medications for this visit.     Allergies  Allergen Reactions  . Niacin And Related Other (See Comments)    Chest pain  . Pravachol [Pravastatin]     Myalgia   . Red Yeast Rice [Cholestin]     Jitters   . Zocor [Simvastatin]     REACTION: Myalgias    Past Medical History:  Diagnosis Date  . Allergy    seasonal  . Amenia    postoperative  . Benign positional vertigo   . CAD (coronary artery disease)    s/p CABG 01/07  . Diverticula of colon   . Esophageal stricture 08/03/2013  . Gilbert's syndrome    possible. (elevated bilirubin)  .  Hemochromatosis   . Hx of adenomatous colonic polyps    Medoff  . Hyperlipidemia   . Hypertension   . Internal hemorrhoid   . Memory loss   . Myocardial infarction ([place]) 05/2005  . Tobacco abuse   . Vertigo     Blood pressure 126/70, pulse 60.   Hyperlipidemia LDL remains above goal for secondary prevention. Patient currently on maximum tolerated statin therapy plus ezetimibe. During initial prior-authorization last year, his insurance indicated preference for Praluent. Will try prior-authorization for Praluent 150mg  [place] every 14 days. 1st sample of Praluent 150mg  and co-pay card were provided today during OV and questions were answered. Mr Mayse was also instructed to increase his fish oil to 3-4 capsules per day for improve TG.  PCSK9i indication, storage, administration, common side  effect, prior authorization, use of co-pay card and monitor were discussed during this office visit.   Oluwaseyi Raffel Rodriguez-Guzman PharmD, BCPS, [place] [id number] 01/02/2018 4:46 PM

## 2017-12-31 NOTE — Patient Instructions (Addendum)
[facility] (pharmacist) Dylan Hudson/Dylan Hudson  *START paperwork for Praleunt 150mg * *INCREASE fish oil to 3 capsules per day*   Cholesterol Cholesterol is a fat. Your body needs a small amount of cholesterol. Cholesterol (plaque) may build up in your blood vessels (arteries). That makes you more likely to have a heart attack or stroke. You cannot feel your cholesterol level. Having a blood test is the only way to find out if your level is high. Keep your test results. Work with your doctor to keep your cholesterol at a good level. What do the results mean?  Total cholesterol is how much cholesterol is in your blood.  LDL is bad cholesterol. This is the type that can build up. Try to have low LDL.  HDL is good cholesterol. It cleans your blood vessels and carries LDL away. Try to have high HDL.  Triglycerides are fat that the body can store or burn for energy. What are good levels of cholesterol?  Total cholesterol below 200.  LDL below 100 is good for people who have health risks. LDL below 70 is good for people who have very high risks.  HDL above 40 is good. It is best to have HDL of 60 or higher.  Triglycerides below 150. How can I lower my cholesterol? Diet Follow your diet program as told by your doctor.  Choose fish, white meat chicken, or [place] that is roasted or baked. Try not to eat red meat, fried foods, sausage, or lunch meats.  Eat lots of fresh fruits and vegetables.  Choose whole grains, beans, pasta, potatoes, and cereals.  Choose olive oil, corn oil, or canola oil. Only use small amounts.  Try not to eat butter, mayonnaise, shortening, or palm kernel oils.  Try not to eat foods with trans fats.  Choose low-fat or nonfat dairy foods. ? Drink skim or nonfat milk. ? Eat low-fat or nonfat yogurt and cheeses. ? Try not to drink whole milk or cream. ? Try not to eat ice cream, egg yolks, or full-fat cheeses.  Healthy desserts include angel food cake, ginger  snaps, animal crackers, hard candy, popsicles, and low-fat or nonfat frozen yogurt. Try not to eat pastries, cakes, pies, and cookies.  Exercise Follow your exercise program as told by your doctor.  Be more active. Try gardening, walking, and taking the stairs.  Ask your doctor about ways that you can be more active.  Medicine  Take over-the-counter and prescription medicines only as told by your doctor. This information is not intended to replace advice given to you by your health care provider. Make sure you discuss any questions you have with your health care provider. Document Released: 08/03/2008 Document Revised: 12/07/2015 Document Reviewed: 11/17/2015 Elsevier Interactive Patient Education  [employer].

## 2018-01-02 ENCOUNTER — Encounter: Payer: Self-pay | Admitting: Pharmacist

## 2018-01-02 NOTE — Assessment & Plan Note (Signed)
LDL remains above goal for secondary prevention. Patient currently on maximum tolerated statin therapy plus ezetimibe. During initial prior-authorization last year, his insurance indicated preference for Praluent. Will try prior-authorization for Praluent 150mg  [place] every 14 days. 1st sample of Praluent 150mg  and co-pay card were provided today during OV and questions were answered. Dylan Hudson was also instructed to increase his fish oil to 3-4 capsules per day for improve TG.  PCSK9i indication, storage, administration, common side effect, prior authorization, use of co-pay card and monitor were discussed during this office visit.

## 2018-01-09 ENCOUNTER — Other Ambulatory Visit: Payer: Self-pay

## 2018-01-09 MED ORDER — PANTOPRAZOLE SODIUM 40 MG PO TBEC: 40.0000 mg | DELAYED_RELEASE_TABLET | Freq: Every day | ORAL | 0 refills | Status: DC

## 2018-01-09 NOTE — Telephone Encounter (Signed)
Pantoprazole refilled and note attached that he needs to make an appointment. Last seen 11/2015.

## 2018-01-17 ENCOUNTER — Telehealth: Payer: Self-pay | Admitting: Pharmacist

## 2018-01-17 MED ORDER — EVOLOCUMAB 140 MG/ML ~~LOC~~ SOAJ: 140.0000 mg | SUBCUTANEOUS | 11 refills | Status: DC

## 2018-01-17 NOTE — Telephone Encounter (Signed)
PA approved by insurance. [facility] to [facility].  Okay to stop taking ezetimibe and decrease crestor to 5mg  1x/week

## 2018-01-21 ENCOUNTER — Telehealth: Payer: Self-pay | Admitting: Cardiology

## 2018-01-21 NOTE — Telephone Encounter (Signed)
New  Message:    Dylan Hudson with  Dylan Hudson has questions about Evolocumab (REPATHA SURECLICK) 524 MG/ML SOAJ

## 2018-01-21 NOTE — Telephone Encounter (Signed)
Patient needs copay card for Repatha.  Advised pharmacy to have patient go online to get card, we no longer have them in office.

## 2018-02-04 ENCOUNTER — Other Ambulatory Visit: Payer: Self-pay | Admitting: Cardiology

## 2018-02-19 ENCOUNTER — Encounter: Payer: Self-pay | Admitting: Internal Medicine

## 2018-02-20 ENCOUNTER — Telehealth: Payer: Self-pay | Admitting: Cardiology

## 2018-02-20 MED ORDER — LISINOPRIL 10 MG PO TABS: 10.0000 mg | ORAL_TABLET | Freq: Every day | ORAL | 11 refills | Status: DC

## 2018-02-20 NOTE — Telephone Encounter (Signed)
Spoke with pt, he has noticed his bp being elevated since the decrease in the metoprolol. 126/96 to 135/103. Pulse 66-82. Advised patient to increase hjis lisinopril to 10 mg once daily, he is currently taking 5 mg. He will continue to monitor his bp and let us know if not coming down. Dr Stanford Breed made aware.

## 2018-02-20 NOTE — Telephone Encounter (Signed)
Pt c/o BP issue: STAT if pt c/o blurred vision, one-sided weakness or slurred speech  1. What are your last 5 BP readings? 133/92 pulse rate 66 and after he walked it was 136/100 and pulse rate was 82  2. Are you having any other symptoms (ex. Dizziness, headache, blurred vision, passed out)? no  3. What is your BP issue?been going all over the place lately-feels a little weird

## 2018-02-20 NOTE — Addendum Note (Signed)
Addended by: Cristopher Estimable on: 02/20/2018 01:17 PM   Modules accepted: Orders

## 2018-03-05 ENCOUNTER — Inpatient Hospital Stay: Payer: 59 | Attending: Hematology

## 2018-03-05 DIAGNOSIS — I2510 Atherosclerotic heart disease of native coronary artery without angina pectoris: Secondary | ICD-10-CM | POA: Diagnosis not present

## 2018-03-05 DIAGNOSIS — Z87891 Personal history of nicotine dependence: Secondary | ICD-10-CM | POA: Diagnosis not present

## 2018-03-05 DIAGNOSIS — Z23 Encounter for immunization: Secondary | ICD-10-CM | POA: Insufficient documentation

## 2018-03-05 LAB — CBC WITH DIFFERENTIAL (CANCER CENTER ONLY)
Abs Immature Granulocytes: 0.01 10*3/uL (ref 0.00–0.07)
BASOS PCT: 1 %
Basophils Absolute: 0.1 10*3/uL (ref 0.0–0.1)
EOS ABS: 0.3 10*3/uL (ref 0.0–0.5)
Eosinophils Relative: 4 %
HCT: 46.5 % (ref 39.0–52.0)
Hemoglobin: 16.2 g/dL (ref 13.0–17.0)
Immature Granulocytes: 0 %
Lymphocytes Relative: 29 %
Lymphs Abs: 1.7 10*3/uL (ref 0.7–4.0)
MCH: 32.1 pg (ref 26.0–34.0)
MCHC: 34.8 g/dL (ref 30.0–36.0)
MCV: 92.3 fL (ref 80.0–100.0)
MONO ABS: 0.7 10*3/uL (ref 0.1–1.0)
MONOS PCT: 12 %
NEUTROS PCT: 54 %
Neutro Abs: 3.1 10*3/uL (ref 1.7–7.7)
PLATELETS: 153 10*3/uL (ref 150–400)
RBC: 5.04 MIL/uL (ref 4.22–5.81)
RDW: 12.9 % (ref 11.5–15.5)
WBC Count: 5.9 10*3/uL (ref 4.0–10.5)
nRBC: 0 % (ref 0.0–0.2)

## 2018-03-05 NOTE — Progress Notes (Signed)
[facility]  [phone] [phone]  Clinic Follow Up Note   Patient Care Team: Marrian Salvage, FNP as PCP - General (Internal Medicine) Gatha Mayer, MD as Consulting Physician (Gastroenterology) Lelon Perla, MD as Consulting Physician (Cardiology)   Date of Service:  12/06/2017  CHIEF COMPLAINTS:  F/u for hemochromatosis  HISTORY OF PRESENTING ILLNESS:   Dylan Hudson 57 y.o. male is a here because of history of hemochromatosis. The patient was referred by FNP Jodi Mourning. The patient presents to the clinic today by himself.   He notes he was never seen by hematologist before, he was diagnosed by Dr. Tasia Catchings several years ago. He notes he was being treated with phlebotomies every 2 months and initially had occasional syncope from this but managed well afterward. He recently had low iron and was started on oral iron replacement last year. He stopped oral iron after 2-3 weeks as this resolved.   Today the patient notes occasional bleeding from internal hemorrhoids.   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 He has 1 adult son. He stopped smoking in 2007 and drinks socially.   He has had open heart surgery in 05/2005 and is followed by cardiologist. He denies family history of hemochromatosis, liver disease or liver cancer. His paternal uncle had colon cancer.  He had EGDs in 2015 by Dr. Carlean Purl. His last colonoscopy was 2012.   CURRENT THERAPY: Phlebotomy as needed  INTERVAL HISTORY  Dylan Hudson is here for a follow up. He has followed up with Cardiologist Dr. Stanford Breed. Today, he is here by himself.   He is clinically doing well, denies any pain or other discomfort.  He has good appetite and energy level.  Weight is stable.  MEDICAL HISTORY:  Past Medical History:  Diagnosis Date  . Allergy    seasonal  . Amenia    postoperative  . Benign positional vertigo   . CAD (coronary artery disease)    s/p CABG 01/07  .  Diverticula of colon   . Esophageal stricture 08/03/2013  . Gilbert's syndrome    possible. (elevated bilirubin)  . Hemochromatosis   . Hx of adenomatous colonic polyps    [place]  . Hyperlipidemia   . Hypertension   . Internal hemorrhoid   . Memory loss   . Myocardial infarction ([place]) 05/2005  . Tobacco abuse   . Vertigo     SURGICAL HISTORY: Past Surgical History:  Procedure Laterality Date  . APPENDECTOMY  2014   lap appy  . COLONOSCOPY  2003, 2007, 02/2011   small adenomas  . CORONARY ARTERY BYPASS GRAFT    . ESOPHAGEAL DILATION  11/19/13  . LAPAROSCOPIC APPENDECTOMY N/A 08/06/2012   Procedure: APPENDECTOMY LAPAROSCOPIC;  Surgeon: Imogene Burn. Georgette Dover, MD;  Location: [facility];  Service: General;  Laterality: N/A;  . left knee arthroscopy    . MEDIAN STERNOTOMY     for CABG x 4 (left internal mammary artery to distal left anterior descending coronary artery, right internal mammary artery to first circumflex marginal branch, saphenous vein graft to posterior descending coronary artery, saphenous vein graft to diagonal branch, endoscopic saphenous vein harvest from right thigh.) SURGEON: Valentina Gu. Roxy Manns, M.D. CHO/MEDQ D:06/08/05. T: 06/09/2005 Job: [id number]  . THERAPEUTIC PHLEBOTOMY  03/21/2015        SOCIAL HISTORY: Social History   Socioeconomic History  . Marital status: Divorced    Spouse name: Not on file  . Number of children: 1  .  Years of education: 38  . Highest education level: Not on file  Occupational History  . Occupation: Therapist, nutritional    Comment: Camera operator  Social Needs  . Financial resource strain: Not on file  . Food insecurity:    Worry: Not on file    Inability: Not on file  . Transportation needs:    Medical: Not on file    Non-medical: Not on file  Tobacco Use  . Smoking status: Former Smoker    Packs/day: 2.00    Years: 36.00    Pack years: 72.00    Types: Cigarettes    Last attempt to quit: 03/07/2006    Years since quitting: 12.0  .  Smokeless tobacco: Never Used  . Tobacco comment: smoked since age 60 approx. 1 pact to 1 and a half packs a day. fortunately after his bypass surgery he completely discontinued smoking  Substance and Sexual Activity  . Alcohol use: Yes    Alcohol/week: 4.0 standard drinks    Types: 4 Cans of beer per week    Comment: socially  . Drug use: No  . Sexual activity: Not on file  Lifestyle  . Physical activity:    Days per week: Not on file    Minutes per session: Not on file  . Stress: Not on file  Relationships  . Social connections:    Talks on phone: Not on file    Gets together: Not on file    Attends religious service: Not on file    Active member of club or organization: Not on file    Attends meetings of clubs or organizations: Not on file    Relationship status: Not on file  . Intimate partner violence:    Fear of current or ex partner: Not on file    Emotionally abused: Not on file    Physically abused: Not on file    Forced sexual activity: Not on file  Other Topics Concern  . Not on file  Social History Narrative   Lives with girlfriend. Fishing is a hobby.    Fun: Go to the lake every weekend.     FAMILY HISTORY: Family History  Problem Relation Age of Onset  . Colon polyps Mother   . Colon cancer Paternal Uncle   . Cancer Paternal Uncle        colon cancer  . Heart attack Maternal Grandfather   . Breast cancer Neg Hx   . Prostate cancer Neg Hx     ALLERGIES:  is allergic to niacin and related; pravachol [pravastatin]; red yeast rice [cholestin]; and zocor [simvastatin].  MEDICATIONS:  Current Outpatient Medications  Medication Sig Dispense Refill  . aspirin 81 MG tablet Take 1 tablet (81 mg total) by mouth daily.    . Evolocumab (REPATHA SURECLICK) 893 MG/ML SOAJ Inject 140 mg into the skin every 14 (fourteen) days. 2 pen 11  . ezetimibe (ZETIA) 10 MG tablet TAKE ONE TABLET BY MOUTH DAILY 30 tablet 11  . fish oil-omega-3 fatty acids 1000 MG capsule Take  2 g by mouth 2 (two) times daily.      . fluticasone (FLONASE) 50 MCG/ACT nasal spray USE 2 SPRAYS INTO THE NOSE DAILY 16 g 1  . lisinopril (PRINIVIL,ZESTRIL) 10 MG tablet Take 1 tablet (10 mg total) by mouth daily. 15 tablet 11  . loratadine (CLARITIN) 10 MG tablet Take 10 mg by mouth daily as needed. For allergies    . metoprolol succinate (TOPROL-XL) 25 MG 24 hr  tablet Take 1 tablet (25 mg total) by mouth daily. Take with or immediately following a meal. 90 tablet 3  . pantoprazole (PROTONIX) 40 MG tablet Take 1 tablet (40 mg total) by mouth daily. Please call for appointment, last seen 11/2015 90 tablet 0  . rosuvastatin (CRESTOR) 5 MG tablet TAKE 1 TABLET BY MOUTH ONCE TO THREE TIMES PER WEEK AS TOLERATED 12 tablet 5   No current facility-administered medications for this visit.     REVIEW OF SYSTEMS:    Constitutional: Denies fevers, chills or abnormal night sweats Eyes: Denies blurriness of vision, double vision or watery eyes Ears, nose, mouth, throat, and face: Denies mucositis or sore throat Respiratory: Denies cough, dyspnea or wheezes Cardiovascular: Denies palpitation, chest discomfort or lower extremity swelling Gastrointestinal:  Denies nausea, heartburn or change in bowel habits  Skin: Denies abnormal skin rashes Lymphatics: Denies new lymphadenopathy or easy bruising Neurological:Denies numbness, tingling or new weaknesses Behavioral/Psych: Mood is stable, no new changes  All other systems were reviewed with the patient and are negative.  PHYSICAL EXAMINATION: ECOG PERFORMANCE STATUS: 0 - Asymptomatic  Vitals:   03/06/18 1532  BP: (!) 136/91  Pulse: 63  Resp: 18  Temp: 97.8 F (36.6 C)  SpO2: 99%   Filed Weights   03/06/18 1532  Weight: 237 lb 11.2 oz (107.8 kg)     GENERAL:alert, no distress and comfortable SKIN: skin color, texture, turgor are normal, no rashes or significant lesions EYES: normal, conjunctiva are pink and non-injected, sclera  clear OROPHARYNX:no exudate, no erythema and lips, buccal mucosa, and tongue normal  NECK: supple, thyroid normal size, non-tender, without nodularity LYMPH:  no palpable lymphadenopathy in the cervical, axillary or inguinal LUNGS: clear to auscultation and percussion with normal breathing effort HEART: regular rate & rhythm and no murmurs and no lower extremity edema ABDOMEN:abdomen soft, non-tender and normal bowel sounds Musculoskeletal:no cyanosis of digits and no clubbing  PSYCH: alert & oriented x 3 with fluent speech NEURO: no focal motor/sensory deficits  LABORATORY DATA:  I have reviewed the data as listed CBC Latest Ref Rng & Units 03/05/2018 12/06/2017 10/28/2017  WBC 4.0 - 10.5 K/uL 5.9 6.5 6.2  Hemoglobin 13.0 - 17.0 g/dL 16.2 15.7 17.0  Hematocrit 39.0 - 52.0 % 46.5 45.4 47.9  Platelets 150 - 400 K/uL 153 156 168.0    CMP Latest Ref Rng & Units 10/28/2017 12/06/2016 11/29/2015  Glucose 70 - 99 mg/dL 107(H) 94 97  BUN 6 - 23 mg/dL 12 11 11   Creatinine 0.40 - 1.50 mg/dL 1.02 1.07 1.01  Sodium 135 - 145 mEq/L 138 140 142  Potassium 3.5 - 5.1 mEq/L 4.0 4.3 4.6  Chloride 96 - 112 mEq/L 101 99 103  CO2 19 - 32 mEq/L 28 23 23   Calcium 8.4 - 10.5 mg/dL 9.5 9.8 9.2  Total Protein 6.0 - 8.3 g/dL 6.8 6.8 6.6  Total Bilirubin 0.2 - 1.2 mg/dL 2.0(H) 1.7(H) 1.3(H)  Alkaline Phos 39 - 117 U/L 47 52 47  AST 0 - 37 U/L 26 37 33  ALT 0 - 53 U/L 39 62(H) 44   Results for Dylan Hudson, Dylan Hudson (MRN [id number]) as of 03/07/2018 08:09  Ref. Range 10/28/2017 08:50 12/06/2017 12:48 03/05/2018 15:36  Iron Latest Ref Range: 42 - 163 ug/dL 96 80 162  UIBC Latest Units: ug/dL  315 235  TIBC Latest Ref Range: 202 - 409 ug/dL 413 395 398  %SAT Latest Ref Range: 15 - 60 % (calc) 23  Saturation Ratios Latest Ref Range: 42 - 163 %  20 (L) 41 (L)  Ferritin Latest Ref Range: 24 - 336 ng/mL 28 28 42   PATHOLOGY  Date Received: 10/24/2009  FINAL DIAGNOSIS Microscopic Examination and Diagnosis 1.  LIVER BX, RANDOM : - 3 TO 4+ HEPATOCYTE HEMOSIDERIN DEPOSITION SEE COMMENT - minimal portal inflammation. - trichrome stain negative for fibrosis. - incidental benign bile duct hamartoma - less than 5% steatosis DATE REPORTED: 10/28/2009  MICROSCOPIC DESCRIPTION 1. Given the known homozygous h63d hemachromatosis gene testing, the morphologic features are in keeping with hereditary hemochromatosis. Tissue will be submitted for iron quantitation with the results to appear in an addendum. The case was discussed with dr. Anda Kraft on 10/25/09. (Cr:Jy) 10/25/09 Addendum: Per [facility], the total iron concentration is 3,082 mcg/g dry weight (normal range: [phone]) with a hepatic iron index of 1.1 mcmol/g/yr (normal range: Less than 1.0). The results were relayed to dr Carlean Purl on 10/28/2009. (Cr:Jy) 10/28/09   RADIOGRAPHIC STUDIES: I have personally reviewed the radiological images as listed and agreed with the findings in the report. No results found.  ASSESSMENT & PLAN:  KOY LAMP is a 57 y.o. caucasian male with a history of CAD and open heart surgery.    1. Hemochromatosis, HFE H63D homozygous -I previously reviewed his medical records including office notes and lab results -He underwent a liver biopsy in 2011, which showed 3-4+ hemosiderin deposition, confirmed hemochromatosis. -His HFE gene mutation test showed H63D/H63D which is not the most common but can cause hemochromatosis.  -I recommended continue phlebotomy, to keep ferritin less than 50 and iron saturation less than 50%, if no significant anemia. -I previously discussed that this is a inheritable disease, given he carries 2 gene mutations, his children will carry at least one gene mutation. I recommend his children to have the genetic test and get their hemoglobin levels and iron level checked and to avoid excessive iron uptake.  -Continue to monitor his iron and ferritin level every 4 months, and  consider phlebotomy if he has ferritin more than 50 or transferrin saturation more than 50%. -Labs reviewed, his recent ferritin was 42, and transferrin saturation 41% from yesterday, no need phlebotomy. -Repeat labs every 4 months, I will see him back in 1 year   2. Cancer screenings -his last EGD was in 2015 and his last colonoscopy in 2012.  -I previously recommended he follow up with Dr. Carlean Purl as he is overdue for Colonoscopy.  -I encouraged him to continue to follow up with PCP   3.  Iron deficiency -Likely secondary to phlebotomy, he has no overt GI bleeding.  Previous colonoscopy was negative -Resolved after a short course of oral iron.  PLAN:  -Lab every 4 months, I will set up phlebotomy if ferritin >50 or iron saturation more than 50% -F/u I 12 months with lab one day before     All questions were answered. The patient knows to call the clinic with any problems, questions or concerns. I spent 10 minutes counseling the patient face to face. The total time spent in the appointment was 15 minutes and more than 50% was on counseling.  Oneal Deputy, am acting as scribe for Truitt Merle, MD.    I have reviewed the above documentation for accuracy and completeness, and I agree with the above.      Truitt Merle, MD 03/05/2018

## 2018-03-06 ENCOUNTER — Inpatient Hospital Stay (HOSPITAL_BASED_OUTPATIENT_CLINIC_OR_DEPARTMENT_OTHER): Payer: 59 | Admitting: Hematology

## 2018-03-06 ENCOUNTER — Telehealth: Payer: Self-pay

## 2018-03-06 DIAGNOSIS — I2510 Atherosclerotic heart disease of native coronary artery without angina pectoris: Secondary | ICD-10-CM

## 2018-03-06 DIAGNOSIS — Z23 Encounter for immunization: Secondary | ICD-10-CM

## 2018-03-06 DIAGNOSIS — Z87891 Personal history of nicotine dependence: Secondary | ICD-10-CM

## 2018-03-06 LAB — IRON AND TIBC
Iron: 162 ug/dL (ref 42–163)
SATURATION RATIOS: 41 % — AB (ref 42–163)
TIBC: 398 ug/dL (ref 202–409)
UIBC: 235 ug/dL

## 2018-03-06 LAB — FERRITIN: FERRITIN: 42 ng/mL (ref 24–336)

## 2018-03-06 MED ORDER — INFLUENZA VAC SPLIT QUAD 0.5 ML IM SUSY
0.5000 mL | PREFILLED_SYRINGE | Freq: Once | INTRAMUSCULAR | Status: AC
  Administered 2018-03-06: 0.5 mL via INTRAMUSCULAR

## 2018-03-06 MED ORDER — INFLUENZA VAC SPLIT QUAD 0.5 ML IM SUSY
PREFILLED_SYRINGE | INTRAMUSCULAR | Status: AC
  Filled 2018-03-06: qty 0.5

## 2018-03-06 NOTE — Telephone Encounter (Signed)
Printed avs and calender of upcoming appointment. Per 10/17 los 

## 2018-03-07 ENCOUNTER — Encounter: Payer: Self-pay | Admitting: Hematology

## 2018-03-27 ENCOUNTER — Other Ambulatory Visit: Payer: Self-pay | Admitting: Pharmacist

## 2018-03-27 DIAGNOSIS — I10 Essential (primary) hypertension: Secondary | ICD-10-CM

## 2018-03-27 MED ORDER — LISINOPRIL 10 MG PO TABS: 20.0000 mg | ORAL_TABLET | Freq: Every day | ORAL | 11 refills | Status: DC

## 2018-03-31 ENCOUNTER — Ambulatory Visit (AMBULATORY_SURGERY_CENTER): Payer: Self-pay

## 2018-03-31 ENCOUNTER — Encounter: Payer: Self-pay | Admitting: Internal Medicine

## 2018-03-31 VITALS — Ht 71.0 in | Wt 238.8 lb

## 2018-03-31 DIAGNOSIS — Z86010 Personal history of colonic polyps: Secondary | ICD-10-CM

## 2018-03-31 NOTE — Progress Notes (Signed)
Denies allergies to eggs or soy products. Denies complication of anesthesia or sedation. Denies use of weight loss medication. Denies use of O2.   Emmi instructions declined.  

## 2018-04-14 ENCOUNTER — Ambulatory Visit (AMBULATORY_SURGERY_CENTER): Payer: 59 | Admitting: Internal Medicine

## 2018-04-14 ENCOUNTER — Encounter: Payer: Self-pay | Admitting: Internal Medicine

## 2018-04-14 VITALS — BP 125/80 | HR 60 | Temp 98.9°F | Resp 12 | Ht 72.0 in | Wt 237.0 lb

## 2018-04-14 DIAGNOSIS — D122 Benign neoplasm of ascending colon: Secondary | ICD-10-CM | POA: Diagnosis not present

## 2018-04-14 DIAGNOSIS — Z86010 Personal history of colonic polyps: Secondary | ICD-10-CM

## 2018-04-14 DIAGNOSIS — D120 Benign neoplasm of cecum: Secondary | ICD-10-CM | POA: Diagnosis not present

## 2018-04-14 DIAGNOSIS — D123 Benign neoplasm of transverse colon: Secondary | ICD-10-CM | POA: Diagnosis not present

## 2018-04-14 DIAGNOSIS — Z1211 Encounter for screening for malignant neoplasm of colon: Secondary | ICD-10-CM | POA: Diagnosis not present

## 2018-04-14 MED ORDER — SODIUM CHLORIDE 0.9 % IV SOLN: 500.0000 mL | Freq: Once | INTRAVENOUS | Status: DC

## 2018-04-14 MED ORDER — PANTOPRAZOLE SODIUM 40 MG PO TBEC: 40.0000 mg | DELAYED_RELEASE_TABLET | Freq: Every day | ORAL | 3 refills | Status: DC

## 2018-04-14 NOTE — Progress Notes (Signed)
To PACU, VSS. Report to [email or web address] 

## 2018-04-14 NOTE — Op Note (Signed)
[facility] Patient Name: Dylan Hudson Procedure Date: 04/14/2018 9:13 AM MRN: [id number] Endoscopist: Gatha Mayer , MD Age: 57 Referring MD:  Date of Birth: 1960/06/01 Gender: Male Account #: [id number] Procedure:                Colonoscopy Indications:              Surveillance: Personal history of adenomatous                            polyps on last colonoscopy > 5 years ago Medicines:                Propofol per Anesthesia, Monitored Anesthesia Care Procedure:                Pre-Anesthesia Assessment:                           - Prior to the procedure, a History and Physical                            was performed, and patient medications and                            allergies were reviewed. The patient's tolerance of                            previous anesthesia was also reviewed. The risks                            and benefits of the procedure and the sedation                            options and risks were discussed with the patient.                            All questions were answered, and informed consent                            was obtained. Prior Anticoagulants: The patient has                            taken no previous anticoagulant or antiplatelet                            agents. ASA Grade Assessment: II - A patient with                            mild systemic disease. After reviewing the risks                            and benefits, the patient was deemed in                            satisfactory condition to undergo the procedure.  After obtaining informed consent, the colonoscope                            was passed under direct vision. Throughout the                            procedure, the patient's blood pressure, pulse, and                            oxygen saturations were monitored continuously. The                            Model CF-HQ190L [id number]) scope was introduced   through the anus and advanced to the the cecum,                            identified by appendiceal orifice and ileocecal                            valve. The colonoscopy was performed without                            difficulty. The patient tolerated the procedure                            well. The quality of the bowel preparation was                            good. The bowel preparation used was Miralax. The                            ileocecal valve, appendiceal orifice, and rectum                            were photographed. Scope In: 9:21:56 AM Scope Out: 9:38:47 AM Scope Withdrawal Time: 0 hours 15 minutes 21 seconds  Total Procedure Duration: 0 hours 16 minutes 51 seconds  Findings:                 The perianal and digital rectal examinations were                            normal.                           Three sessile polyps were found in the transverse                            colon, ascending colon and cecum. The polyps were                            diminutive in size. These polyps were removed with                            a cold snare. Resection and retrieval were  complete. Verification of patient identification                            for the specimen was done. Estimated blood loss was                            minimal.                           A single large-mouthed diverticulum was found in                            the ascending colon.                           External and internal hemorrhoids were found.                           The exam was otherwise without abnormality on                            direct and retroflexion views. Complications:            No immediate complications. Estimated Blood Loss:     Estimated blood loss was minimal. Impression:               - Three diminutive polyps in the transverse colon,                            in the ascending colon and in the cecum, removed                             with a cold snare. Resected and retrieved.                           - Diverticulosis in the ascending colon.                           - Internal hemorrhoids.                           - The examination was otherwise normal on direct                            and retroflexion views.                           - Personal history of adenomatous colonic polyps -                            last in 2012 Recommendation:           - Patient has a contact number available for                            emergencies. The signs and symptoms of potential  delayed complications were discussed with the                            patient. Return to normal activities tomorrow.                            Written discharge instructions were provided to the                            patient.                           - Resume previous diet.                           - Continue present medications.                           - Repeat colonoscopy is recommended for                            surveillance. The colonoscopy date will be                            determined after pathology results from today's                            exam become available for review. Gatha Mayer, MD 04/14/2018 9:55:31 AM This report has been signed electronically.

## 2018-04-14 NOTE — Progress Notes (Signed)
Called to room to assist during endoscopic procedure.  Patient ID and intended procedure confirmed with present staff. Received instructions for my participation in the procedure from the performing physician.  

## 2018-04-14 NOTE — Patient Instructions (Addendum)
Thank you for allowing [place] to to care for you today.  Await pathology results by mail, approximately 2 weeks.  Next colonoscopy will be determined at that time.  Follow up appointment with Dr Carlean Purl.  Handouts provided for hemorrhoids, polyps, and hemorrhoidal banding.  Resume previous diet and medications today.  Return to normal activities tomorrow.     YOU HAD AN ENDOSCOPIC PROCEDURE TODAY AT [facility]:   Refer to the procedure report that was given to you for any specific questions about what was found during the examination.  If the procedure report does not answer your questions, please call your gastroenterologist to clarify.  If you requested that your care partner not be given the details of your procedure findings, then the procedure report has been included in a sealed envelope for you to review at your convenience later.  YOU SHOULD EXPECT: Some feelings of bloating in the abdomen. Passage of more gas than usual.  Walking can help get rid of the air that was put into your GI tract during the procedure and reduce the bloating. If you had a lower endoscopy (such as a colonoscopy or flexible sigmoidoscopy) you may notice spotting of blood in your stool or on the toilet paper. If you underwent a bowel prep for your procedure, you may not have a normal bowel movement for a few days.  Please Note:  You might notice some irritation and congestion in your nose or some drainage.  This is from the oxygen used during your procedure.  There is no need for concern and it should clear up in a day or so.  SYMPTOMS TO REPORT IMMEDIATELY:   Following lower endoscopy (colonoscopy or flexible sigmoidoscopy):  Excessive amounts of blood in the stool  Significant tenderness or worsening of abdominal pains  Swelling of the abdomen that is new, acute  Fever of 100F or higher    For urgent or emergent issues, a gastroenterologist can be reached at any hour by calling [phone]  [phone].   DIET:  We do recommend a small meal at first, but then you may proceed to your regular diet.  Drink plenty of fluids but you should avoid alcoholic beverages for 24 hours.  ACTIVITY:  You should plan to take it easy for the rest of today and you should NOT DRIVE or use heavy machinery until tomorrow (because of the sedation medicines used during the test).    FOLLOW UP: Our staff will call the number listed on your records the next business day following your procedure to check on you and address any questions or concerns that you may have regarding the information given to you following your procedure. If we do not reach you, we will leave a message.  However, if you are feeling well and you are not experiencing any problems, there is no need to return our call.  We will assume that you have returned to your regular daily activities without incident.  If any biopsies were taken you will be contacted by phone or by letter within the next 1-3 weeks.  Please call us at [phone] if you have not heard about the biopsies in 3 weeks.    SIGNATURES/CONFIDENTIALITY: You and/or your care partner have signed paperwork which will be entered into your electronic medical record.  These signatures attest to the fact that that the information above on your After Visit Summary has been reviewed and is understood.  Full responsibility of the confidentiality of this discharge  information lies with you and/or your care-partner. 

## 2018-04-14 NOTE — Progress Notes (Signed)
Pt's states no medical or surgical changes since previsit or office visit. 

## 2018-04-15 ENCOUNTER — Telehealth: Payer: Self-pay | Admitting: *Deleted

## 2018-04-15 NOTE — Telephone Encounter (Signed)
  Follow up Call-  Call back number 04/14/2018  Post procedure Call Back phone  # [phone]  Permission to leave phone message Yes  Some recent data might be hidden     Patient questions:  Do you have a fever, pain , or abdominal swelling? No. Pain Score  0 *  Have you tolerated food without any problems? Yes.    Have you been able to return to your normal activities? Yes.    Do you have any questions about your discharge instructions: Diet   No. Medications  No. Follow up visit  No.  Do you have questions or concerns about your Care? No.  Actions: * If pain score is 4 or above: No action needed, pain <4.

## 2018-04-20 ENCOUNTER — Encounter: Payer: Self-pay | Admitting: Internal Medicine

## 2018-04-20 NOTE — Progress Notes (Signed)
3 dimin adenomas Recall 2024 My Chart

## 2018-05-01 ENCOUNTER — Encounter: Payer: Self-pay | Admitting: Internal Medicine

## 2018-05-01 ENCOUNTER — Ambulatory Visit: Payer: 59 | Admitting: Internal Medicine

## 2018-05-01 DIAGNOSIS — K648 Other hemorrhoids: Secondary | ICD-10-CM

## 2018-05-01 NOTE — Assessment & Plan Note (Addendum)
Wait 1 month and see response of banding RP and LL. Benefiber 1 tablespoon x 1 month If sxs not resolved call back and we will reassess and consider additional banding

## 2018-05-01 NOTE — Patient Instructions (Addendum)
If you are age 59 or older, your body mass index should be between 23-30. Your Body mass index is 33.5 kg/m. If this is out of the aforementioned range listed, please consider follow up with your Primary Care Provider.  If you are age 14 or younger, your body mass index should be between 19-25. Your Body mass index is 33.5 kg/m. If this is out of the aformentioned range listed, please consider follow up with your Primary Care Provider.   Please take Benefiber 1 tablespoon a day for the next month.   Call in a month with an update and schedule another banding visit if needed.   It was a pleasure to see you today!  Dr. Carlean Purl  HEMORRHOID BANDING PROCEDURE    FOLLOW-UP CARE   1. The procedure you have had should have been relatively painless since the banding of the area involved does not have nerve endings and there is no pain sensation.  The rubber band cuts off the blood supply to the hemorrhoid and the band may fall off as soon as 48 hours after the banding (the band may occasionally be seen in the toilet bowl following a bowel movement). You may notice a temporary feeling of fullness in the rectum which should respond adequately to plain Tylenol or Motrin.  2. Following the banding, avoid strenuous exercise that evening and resume full activity the next day.  A sitz bath (soaking in a warm tub) or bidet is soothing, and can be useful for cleansing the area after bowel movements.     3. To avoid constipation, take two tablespoons of natural wheat bran, natural oat bran, flax, Benefiber or any over the counter fiber supplement and increase your water intake to 7-8 glasses daily.    4. Unless you have been prescribed anorectal medication, do not put anything inside your rectum for two weeks: No suppositories, enemas, fingers, etc.  5. Occasionally, you may have more bleeding than usual after the banding procedure.  This is often from the untreated hemorrhoids rather than the treated  one.  Don't be concerned if there is a tablespoon or so of blood.  If there is more blood than this, lie flat with your bottom higher than your head and apply an ice pack to the area. If the bleeding does not stop within a half an hour or if you feel faint, call our office at [phone] or go to the emergency room.  6. Problems are not common; however, if there is a substantial amount of bleeding, severe pain, chills, fever or difficulty passing urine (very rare) or other problems, you should call us at [phone] or report to the nearest emergency room.  7. Do not stay seated continuously for more than 2-3 hours for a day or two after the procedure.  Tighten your buttock muscles 10-15 times every two hours and take 10-15 deep breaths every 1-2 hours.  Do not spend more than a few minutes on the toilet if you cannot empty your bowel; instead re-visit the toilet at a later time.

## 2018-05-01 NOTE — Progress Notes (Signed)
Dylan Hudson 57 y.o. [birth year] [id number]  Assessment & Plan:  Internal and external prolapsed hemorrhoids Wait 1 month and see response of banding RP and LL. Benefiber 1 tablespoon x 1 month If sxs not resolved call back and we will reassess and consider additional banding       Subjective:   Chief Complaint: Hemorrhoids, discuss treatment  HPI Dylan Hudson is here, he discussed bleeding hemorrhoids with me at his recent colonoscopy (adenomas recall 2024) where I saw internal and external hemorrhoids and he also has some anal tags.  His symptoms are that of intermittent bleeding which is spotting of blood, bright red, blood on the toilet paper, and prolapse symptoms.  He has a remote history of banding by Dylan Hudson probably 20 years ago.  For the past 4 to 5 years he has had intermittent bleeding and is interested in eliminating that with hemorrhoidal banding.  His bowel movements are regular he does not do a lot of heavy lifting or things like that though he may lift firewood at home he generally sits at a desk.  No pain with defecation.  Allergies  Allergen Reactions  . Niacin And Related Other (See Comments)    Chest pain  . Pravachol [Pravastatin]     Myalgia   . Red Yeast Rice [Cholestin]     Jitters   . Zocor [Simvastatin]     REACTION: Myalgias   Current Meds  Medication Sig  . aspirin 81 MG tablet Take 1 tablet (81 mg total) by mouth daily.  . Evolocumab (REPATHA SURECLICK) 700 MG/ML SOAJ Inject 140 mg into the skin every 14 (fourteen) days.  . fish oil-omega-3 fatty acids 1000 MG capsule Take 2 g by mouth 2 (two) times daily.    . fluticasone (FLONASE) 50 MCG/ACT nasal spray USE 2 SPRAYS INTO THE NOSE DAILY  . lisinopril (PRINIVIL,ZESTRIL) 10 MG tablet Take 2 tablets (20 mg total) by mouth daily.  Marland Kitchen loratadine (CLARITIN) 10 MG tablet Take 10 mg by mouth daily as needed. For allergies  . metoprolol succinate (TOPROL-XL) 25 MG 24 hr tablet Take 1 tablet (25  mg total) by mouth daily. Take with or immediately following a meal.  . pantoprazole (PROTONIX) 40 MG tablet Take 1 tablet (40 mg total) by mouth daily.  . rosuvastatin (CRESTOR) 5 MG tablet TAKE 1 TABLET BY MOUTH ONCE TO THREE TIMES PER WEEK AS TOLERATED  . [DISCONTINUED] pantoprazole (PROTONIX) 40 MG tablet Take 1 tablet (40 mg total) by mouth daily. Please call for appointment, last seen 11/2015   Past Medical History:  Diagnosis Date  . Allergy    seasonal  . Amenia    postoperative  . Anemia   . Benign positional vertigo   . CAD (coronary artery disease)    s/p CABG 01/07  . Diverticula of colon   . Esophageal stricture 08/03/2013  . GERD (gastroesophageal reflux disease)   . Gilbert's syndrome    possible. (elevated bilirubin)  . Hemochromatosis   . Hx of adenomatous colonic polyps    Medoff  . Hyperlipidemia   . Hypertension   . Internal hemorrhoid   . Memory loss   . Myocardial infarction ([place]) 05/2005  . Tobacco abuse   . Vertigo    Past Surgical History:  Procedure Laterality Date  . APPENDECTOMY  2014   lap appy  . COLONOSCOPY  2003, 2007, 02/2011   small adenomas  . CORONARY ARTERY BYPASS GRAFT    . ESOPHAGEAL DILATION  11/19/13  . LAPAROSCOPIC APPENDECTOMY N/A 08/06/2012   Procedure: APPENDECTOMY LAPAROSCOPIC;  Surgeon: Dylan Burn. Georgette Dover, MD;  Location: [facility];  Service: General;  Laterality: N/A;  . left knee arthroscopy    . MEDIAN STERNOTOMY     for CABG x 4 (left internal mammary artery to distal left anterior descending coronary artery, right internal mammary artery to first circumflex marginal branch, saphenous vein graft to posterior descending coronary artery, saphenous vein graft to diagonal branch, endoscopic saphenous vein harvest from right thigh.) SURGEON: Dylan Hudson, M.D. CHO/MEDQ D:06/08/05. T: 06/09/2005 Job: [id number]  . THERAPEUTIC PHLEBOTOMY  03/21/2015       Social History   Social History Narrative   Lives with girlfriend. Fishing is a  hobby.    Fun: Go to the lake every weekend.    family history includes Colon cancer in his paternal uncle; Colon polyps in his mother; Heart attack in his maternal grandfather.   Review of Systems As above, he has a history of vasovagal reactions  Objective:   Physical Exam BP 100/70 (BP Location: Left Arm, Patient Position: Sitting, Cuff Size: Normal)   Pulse 68   Ht 5' 10.75" (1.797 m)   Wt 238 lb 8 oz (108.2 kg)   BMI 33.50 kg/m  No acute distress    Rectal exam in left lateral decubitus Small tags all positions no rash Mild stenosis/spasm slightly tender in the anal canal but no sign of fissure, no mass   Anoscopy   Gr 2 int/ext hems all 3 right posterior and left lateral are the most prominent and there is some contact heme at the junction of the internal and external hemorrhoids from the anus scope in these positions.  PROCEDURE NOTE: The patient presents with symptomatic grade 2 hemorrhoids, requesting rubber band ligation of his/her hemorrhoidal disease.  All risks, benefits and alternative forms of therapy were described and informed consent was obtained.   The anorectum was pre-medicated with topical nitroglycerin 0.125% and 5% lidocaine The decision was made to band the lateral and right posterior internal hemorrhoids, and the [place] was used to perform band ligation without complication.  Digital anorectal examination was then performed to assure proper positioning of the band, and to adjust the banded tissue as required.  The patient was discharged home without pain or other issues.  Dietary and behavioral recommendations were given and along with follow-up instructions.     The following adjunctive treatments were recommended:  Benefiber 1 tablespoon daily for 1 month at least  The patient will return as needed for  follow-up and possible additional banding as required. Think banding of these 2 hemorrhoidal columns may solve his issues but if  still having problems after 1 month he is to call and we will schedule an appointment  Did experience transient vasovagal-like symptoms as he is used to which with a little bit of waiting and recumbency he was fine.  He felt like the pressure in his rectum was not a problem so I did not adjust things.  He felt otherwise well at time of leaving the office and was not diaphoretic and did not feel nauseous or weak or lightheaded and his color was good.    I appreciate the opportunity to care for this patient. CC: Marrian Salvage, [place]

## 2018-06-03 ENCOUNTER — Telehealth: Payer: Self-pay

## 2018-06-03 DIAGNOSIS — E785 Hyperlipidemia, unspecified: Secondary | ICD-10-CM

## 2018-06-03 NOTE — Telephone Encounter (Signed)
Called left msg asking patient if still on repatha and told them I ordered labs to fast and come in asap

## 2018-06-11 NOTE — Telephone Encounter (Signed)
Called pt to see if taking repatha pt stated yes 2 months in and that they will come into get labs this week

## 2018-06-12 ENCOUNTER — Other Ambulatory Visit: Payer: Self-pay | Admitting: *Deleted

## 2018-06-12 MED ORDER — LISINOPRIL 20 MG PO TABS: 20.0000 mg | ORAL_TABLET | Freq: Every day | ORAL | 1 refills | Status: DC

## 2018-07-03 ENCOUNTER — Inpatient Hospital Stay: Payer: 59 | Attending: Hematology

## 2018-07-03 LAB — CBC WITH DIFFERENTIAL (CANCER CENTER ONLY)
Abs Immature Granulocytes: 0.01 10*3/uL (ref 0.00–0.07)
Basophils Absolute: 0.1 10*3/uL (ref 0.0–0.1)
Basophils Relative: 1 %
Eosinophils Absolute: 0.3 10*3/uL (ref 0.0–0.5)
Eosinophils Relative: 5 %
HEMATOCRIT: 44.7 % (ref 39.0–52.0)
Hemoglobin: 15.3 g/dL (ref 13.0–17.0)
Immature Granulocytes: 0 %
LYMPHS ABS: 1.6 10*3/uL (ref 0.7–4.0)
Lymphocytes Relative: 27 %
MCH: 32.3 pg (ref 26.0–34.0)
MCHC: 34.2 g/dL (ref 30.0–36.0)
MCV: 94.3 fL (ref 80.0–100.0)
MONOS PCT: 15 %
Monocytes Absolute: 0.9 10*3/uL (ref 0.1–1.0)
Neutro Abs: 3 10*3/uL (ref 1.7–7.7)
Neutrophils Relative %: 52 %
Platelet Count: 170 10*3/uL (ref 150–400)
RBC: 4.74 MIL/uL (ref 4.22–5.81)
RDW: 12.9 % (ref 11.5–15.5)
WBC Count: 5.8 10*3/uL (ref 4.0–10.5)
nRBC: 0 % (ref 0.0–0.2)

## 2018-07-04 LAB — IRON AND TIBC
IRON: 118 ug/dL (ref 42–163)
Saturation Ratios: 32 % (ref 20–55)
TIBC: 373 ug/dL (ref 202–409)
UIBC: 255 ug/dL (ref 117–376)

## 2018-07-04 LAB — FERRITIN: FERRITIN: 42 ng/mL (ref 24–336)

## 2018-07-08 ENCOUNTER — Telehealth: Payer: Self-pay

## 2018-07-08 NOTE — Telephone Encounter (Signed)
Spoke with patient regarding lab results.  Per Dr. Burr Medico iron level is good, no need for phlebotomy at this time, patient verbalized an understanding.

## 2018-07-08 NOTE — Telephone Encounter (Signed)
-----   Message from Truitt Merle, MD sent at 07/05/2018 11:58 PM EST ----- Please let pt know his iron level is good, no need phlebotomy this time, thanks  Truitt Merle  07/05/2018

## 2018-07-09 ENCOUNTER — Ambulatory Visit (INDEPENDENT_AMBULATORY_CARE_PROVIDER_SITE_OTHER)
Admission: RE | Admit: 2018-07-09 | Discharge: 2018-07-09 | Disposition: A | Discharge status: Home / Self Care | Payer: 59 | Source: Ambulatory Visit | Attending: Acute Care | Admitting: Acute Care

## 2018-07-09 DIAGNOSIS — Z122 Encounter for screening for malignant neoplasm of respiratory organs: Secondary | ICD-10-CM

## 2018-07-09 DIAGNOSIS — Z87891 Personal history of nicotine dependence: Secondary | ICD-10-CM

## 2018-07-15 ENCOUNTER — Other Ambulatory Visit: Payer: Self-pay

## 2018-07-15 DIAGNOSIS — E785 Hyperlipidemia, unspecified: Secondary | ICD-10-CM | POA: Diagnosis not present

## 2018-07-15 LAB — LIPID PANEL
Chol/HDL Ratio: 3.3 ratio (ref 0.0–5.0)
Cholesterol, Total: 123 mg/dL (ref 100–199)
HDL: 37 mg/dL — ABNORMAL LOW (ref >39)
LDL Calculated: 51 mg/dL (ref 0–99)
Triglycerides: 174 mg/dL — ABNORMAL HIGH (ref 0–149)
VLDL Cholesterol Cal: 35 mg/dL (ref 5–40)

## 2018-07-15 LAB — HEPATIC FUNCTION PANEL
ALBUMIN: 4.4 g/dL (ref 3.8–4.9)
ALT: 47 IU/L — ABNORMAL HIGH (ref 0–44)
AST: 28 IU/L (ref 0–40)
Alkaline Phosphatase: 56 IU/L (ref 39–117)
Bilirubin Total: 1.7 mg/dL — ABNORMAL HIGH (ref 0.0–1.2)
Bilirubin, Direct: 0.69 mg/dL — ABNORMAL HIGH (ref 0.00–0.40)
Total Protein: 6.5 g/dL (ref 6.0–8.5)

## 2018-07-16 ENCOUNTER — Telehealth: Payer: Self-pay | Admitting: Acute Care

## 2018-07-16 DIAGNOSIS — Z122 Encounter for screening for malignant neoplasm of respiratory organs: Secondary | ICD-10-CM

## 2018-07-16 DIAGNOSIS — Z87891 Personal history of nicotine dependence: Secondary | ICD-10-CM

## 2018-07-16 NOTE — Telephone Encounter (Signed)
Pt informed of CT results per Sarah Groce, NP.  PT verbalized understanding.  Copy sent to PCP.  Order placed for 1 yr f/u CT.  

## 2018-07-21 ENCOUNTER — Other Ambulatory Visit: Payer: Self-pay | Admitting: Acute Care

## 2018-08-07 ENCOUNTER — Other Ambulatory Visit: Payer: Self-pay | Admitting: Cardiology

## 2018-10-27 ENCOUNTER — Telehealth: Payer: Self-pay | Admitting: Pharmacist

## 2018-10-27 ENCOUNTER — Other Ambulatory Visit: Payer: Self-pay

## 2018-10-27 MED ORDER — ROSUVASTATIN CALCIUM 5 MG PO TABS: ORAL_TABLET | ORAL | 0 refills | Status: DC

## 2018-10-27 NOTE — Telephone Encounter (Signed)
Medication Samples have been provided to the patient.  Drug name: Repatha      Strength: 140mg       Qty: 2  VKP:[id number]  Exp.Date: 12/2020  Yuvin Bussiere Rodriguez-Guzman PharmD, BCPS, [place] [address] [place] [address] 10/27/2018 10:07 AM   *PA renewal for Repatha currently on appeal*

## 2018-10-27 NOTE — Telephone Encounter (Signed)
Rx(s) sent to pharmacy electronically.  

## 2018-11-05 ENCOUNTER — Other Ambulatory Visit: Payer: Self-pay

## 2018-11-05 ENCOUNTER — Encounter: Payer: Self-pay | Admitting: Family

## 2018-11-05 ENCOUNTER — Ambulatory Visit (INDEPENDENT_AMBULATORY_CARE_PROVIDER_SITE_OTHER): Payer: 59 | Admitting: Family

## 2018-11-05 VITALS — BP 104/74 | HR 67 | Temp 98.1°F | Ht 70.75 in | Wt 233.2 lb

## 2018-11-05 DIAGNOSIS — Z0000 Encounter for general adult medical examination without abnormal findings: Secondary | ICD-10-CM | POA: Diagnosis not present

## 2018-11-05 DIAGNOSIS — Z125 Encounter for screening for malignant neoplasm of prostate: Secondary | ICD-10-CM | POA: Diagnosis not present

## 2018-11-05 NOTE — Progress Notes (Signed)
Dylan Hudson is a 58 y.o. male with the following history as recorded in EpicCare:  Patient Active Problem List   Diagnosis Date Noted  . Internal and external prolapsed hemorrhoids 05/01/2018  . History of nicotine dependence 02/14/2016  . Routine general medical examination at a health care facility 08/14/2013  . Hyperglycemia 08/13/2012  . S/P appendectomy 08/06/2012  . Rhinitis 07/28/2011  . LIBIDO, DECREASED 08/02/2010  . 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 [place] SYNDROME 10/05/2009  . MEMORY LOSS 11/14/2007  . Coronary atherosclerosis 07/14/2007  . ADENOMATOUS COLONIC POLYPS, HX OF 07/14/2007  . Hyperlipidemia 03/28/2007  . Essential hypertension 03/28/2007    Current Outpatient Medications  Medication Sig Dispense Refill  . aspirin 81 MG tablet Take 1 tablet (81 mg total) by mouth daily.    . Evolocumab (REPATHA SURECLICK) 924 MG/ML SOAJ Inject 140 mg into the skin every 14 (fourteen) days. 2 pen 11  . fish oil-omega-3 fatty acids 1000 MG capsule Take 2 g by mouth 2 (two) times daily.      . fluticasone (FLONASE) 50 MCG/ACT nasal spray USE 2 SPRAYS INTO THE NOSE DAILY 16 g 1  . lisinopril (PRINIVIL,ZESTRIL) 20 MG tablet Take 1 tablet (20 mg total) by mouth daily. 90 tablet 1  . loratadine (CLARITIN) 10 MG tablet Take 10 mg by mouth daily as needed. For allergies    . metoprolol succinate (TOPROL-XL) 25 MG 24 hr tablet Take 1 tablet (25 mg total) by mouth daily. Take with or immediately following a meal. 90 tablet 3  . pantoprazole (PROTONIX) 40 MG tablet Take 1 tablet (40 mg total) by mouth daily. 90 tablet 3  . rosuvastatin (CRESTOR) 5 MG tablet Take 1 tablet (5 mg total) by mouth 1-3 times weekly as tolerated. 12 tablet 0   No current facility-administered medications for this visit.     Allergies: Niacin and related, Pravachol [pravastatin], Red yeast rice [cholestin], and Zocor [simvastatin]  Past Medical History:  Diagnosis Date  . Allergy    seasonal  . Amenia    postoperative  . Anemia   . Benign positional vertigo   . CAD (coronary artery disease)    s/p CABG 01/07  . Diverticula of colon   . Esophageal stricture 08/03/2013  . GERD (gastroesophageal reflux disease)   . Gilbert's syndrome    possible. (elevated bilirubin)  . Hemochromatosis   . Hx of adenomatous colonic polyps    Medoff  . Hyperlipidemia   . Hypertension   . Internal hemorrhoid   . Memory loss   . Myocardial infarction ([place]) 05/2005  . Tobacco abuse   . Vertigo     Past Surgical History:  Procedure Laterality Date  . APPENDECTOMY  2014   lap appy  . COLONOSCOPY  2003, 2007, 02/2011   small adenomas  . CORONARY ARTERY BYPASS GRAFT    . ESOPHAGEAL DILATION  11/19/13  . LAPAROSCOPIC APPENDECTOMY N/A 08/06/2012   Procedure: APPENDECTOMY LAPAROSCOPIC;  Surgeon: Imogene Burn. Georgette Dover, MD;  Location: [facility];  Service: General;  Laterality: N/A;  . left knee arthroscopy    . MEDIAN STERNOTOMY     for CABG x 4 (left internal mammary artery to distal left anterior descending coronary artery, right internal mammary artery to first circumflex marginal branch, saphenous vein graft to posterior descending coronary artery, saphenous vein graft to diagonal branch, endoscopic saphenous vein harvest from right thigh.) SURGEON: Valentina Gu. Roxy Manns, M.D. CHO/MEDQ D:06/08/05. T: 06/09/2005 Job: [id number]  . THERAPEUTIC PHLEBOTOMY  03/21/2015  Family History  Problem Relation Age of Onset  . Colon polyps Mother   . Colon cancer Paternal Uncle   . Heart attack Maternal Grandfather   . Breast cancer Neg Hx   . Prostate cancer Neg Hx   . Esophageal cancer Neg Hx   . Stomach cancer Neg Hx   . Rectal cancer Neg Hx     Social History   Tobacco Use  . Smoking status: Former Smoker    Packs/day: 2.00    Years: 36.00    Pack years: 72.00    Types: Cigarettes    Quit date: 03/07/2006    Years since quitting: 12.6  . Smokeless tobacco: Never Used  . Tobacco comment:  smoked since age 80 approx. 1 pact to 1 and a half packs a day. fortunately after his bypass surgery he completely discontinued smoking  Substance Use Topics  . Alcohol use: Yes    Alcohol/week: 4.0 standard drinks    Types: 4 Cans of beer per week    Comment: socially    Subjective:  Patient presents for yearly CPE; in baseline state of health today; Under care of cardiology and pulmonology;  Had lung cancer screen earlier this year- plan to check again next year; Now on combination of Repatha and Crestor 3 x per week- lipids are much better/ patient feeling very good;   Up to date on colonoscopy- due again 2024;  Scheduled to see hematologist tomorrow for labs- checking iron level to see if blood draw needed  for hemachromatosis;  Does have eye doctor and dentist;  Review of Systems  Constitutional: Negative.   HENT: Negative.   Eyes: Negative.   Respiratory: Negative for shortness of breath.   Cardiovascular: Negative for chest pain.  Gastrointestinal: Negative for abdominal pain, constipation and diarrhea.  Genitourinary: Negative.   Musculoskeletal: Negative for myalgias.  Skin: Negative.   Neurological: Negative for dizziness and headaches.  Endo/Heme/Allergies: Negative.   Psychiatric/Behavioral: Negative for depression. The patient has insomnia.        Intermittent insomnia      Objective:  Vitals:   11/05/18 0852  BP: 104/74  Pulse: 67  Temp: 98.1 F (36.7 C)  TempSrc: Oral  SpO2: 97%  Weight: 233 lb 3.2 oz (105.8 kg)  Height: 5' 10.75" (1.797 m)    General: Well developed, well nourished, in no acute distress  Skin : Warm and dry.  Head: Normocephalic and atraumatic  Eyes: Sclera and conjunctiva clear; pupils round and reactive to light; extraocular movements intact  Ears: External normal; canals clear; tympanic membranes normal  Oropharynx: Pink, supple. No suspicious lesions  Neck: Supple without thyromegaly, adenopathy  Lungs: Respirations unlabored;  clear to auscultation bilaterally without wheeze, rales, rhonchi  CVS exam: normal rate and regular rhythm.  Abdomen: Soft; nontender; nondistended; normoactive bowel sounds; no masses or hepatosplenomegaly  Musculoskeletal: No deformities; no active joint inflammation  Extremities: No edema, cyanosis, clubbing  Vessels: Symmetric bilaterally  Neurologic: Alert and oriented; speech intact; face symmetrical; moves all extremities well; CNII-XII intact without focal deficit   Assessment:  1. PE (physical exam), annual   2. Prostate cancer screening     Plan:  Age appropriate preventive healthcare needs addressed; encouraged regular eye doctor and dental exams; encouraged regular exercise; will update labs and refills as needed today; follow-up to be determined; Continue with specialists as discussed; congratulated patient on commitment to health; follow-up in 1 year, sooner prn.    No follow-ups on file.  Orders Placed  This Encounter  Procedures  . PSA    Standing Status:   Future    Standing Expiration Date:   11/05/2019    Requested Prescriptions    No prescriptions requested or ordered in this encounter

## 2018-11-06 ENCOUNTER — Other Ambulatory Visit: Payer: Self-pay

## 2018-11-06 ENCOUNTER — Inpatient Hospital Stay: Payer: 59 | Attending: Hematology

## 2018-11-06 LAB — CBC WITH DIFFERENTIAL (CANCER CENTER ONLY)
Abs Immature Granulocytes: 0.02 10*3/uL (ref 0.00–0.07)
Basophils Absolute: 0.1 10*3/uL (ref 0.0–0.1)
Basophils Relative: 1 %
Eosinophils Absolute: 0.3 10*3/uL (ref 0.0–0.5)
Eosinophils Relative: 6 %
HCT: 47.8 % (ref 39.0–52.0)
Hemoglobin: 16.5 g/dL (ref 13.0–17.0)
Immature Granulocytes: 0 %
Lymphocytes Relative: 33 %
Lymphs Abs: 1.9 10*3/uL (ref 0.7–4.0)
MCH: 32.7 pg (ref 26.0–34.0)
MCHC: 34.5 g/dL (ref 30.0–36.0)
MCV: 94.7 fL (ref 80.0–100.0)
Monocytes Absolute: 0.8 10*3/uL (ref 0.1–1.0)
Monocytes Relative: 15 %
Neutro Abs: 2.5 10*3/uL (ref 1.7–7.7)
Neutrophils Relative %: 45 %
Platelet Count: 175 10*3/uL (ref 150–400)
RBC: 5.05 MIL/uL (ref 4.22–5.81)
RDW: 12.6 % (ref 11.5–15.5)
WBC Count: 5.7 10*3/uL (ref 4.0–10.5)
nRBC: 0 % (ref 0.0–0.2)

## 2018-11-07 ENCOUNTER — Telehealth: Payer: Self-pay

## 2018-11-07 LAB — PSA, TOTAL AND FREE
PSA, Free Pct: 20 %
PSA, Free: 0.06 ng/mL
Prostate Specific Ag, Serum: 0.3 ng/mL (ref 0.0–4.0)

## 2018-11-07 LAB — IRON AND TIBC
Iron: 197 ug/dL — ABNORMAL HIGH (ref 42–163)
Saturation Ratios: 48 % (ref 20–55)
TIBC: 410 ug/dL — ABNORMAL HIGH (ref 202–409)
UIBC: 213 ug/dL (ref 117–376)

## 2018-11-07 LAB — FERRITIN: Ferritin: 46 ng/mL (ref 24–336)

## 2018-11-07 NOTE — Telephone Encounter (Signed)
-----   Message from Truitt Merle, MD sent at 11/07/2018  4:01 PM EDT ----- Please let pt know his iron level was high, and I will schedule phlebotomy for him, PSA normal, thanks   Truitt Merle  11/07/2018

## 2018-11-07 NOTE — Telephone Encounter (Signed)
Spoke with pt advised per Dr Burr Medico iron level was high and she will schedule phlebotomy, advised PSA was normal. Pt verbalized understanding.

## 2018-11-11 ENCOUNTER — Telehealth: Payer: Self-pay | Admitting: Hematology

## 2018-11-11 NOTE — Telephone Encounter (Signed)
Scheduled appt per 6/19 sch message - pt is aware of appt date and time   

## 2018-11-14 ENCOUNTER — Other Ambulatory Visit: Payer: Self-pay

## 2018-11-14 ENCOUNTER — Inpatient Hospital Stay: Payer: 59

## 2018-11-14 NOTE — Progress Notes (Signed)
Dylan Hudson presents today for phlebotomy per MD orders. Phlebotomy procedure started at 1452 and ended at 1452. 525 grams removed via 18 gauge PIV to L AC. Patient observed for 30 minutes after procedure without any incident. Patient tolerated procedure well and given post procedure snacks. IV needle removed intact.

## 2018-11-14 NOTE — Patient Instructions (Signed)

## 2018-11-26 ENCOUNTER — Other Ambulatory Visit: Payer: Self-pay | Admitting: Cardiology

## 2018-11-27 ENCOUNTER — Telehealth: Payer: Self-pay

## 2018-11-27 MED ORDER — REPATHA SURECLICK 140 MG/ML ~~LOC~~ SOAJ: 140.0000 mg | SUBCUTANEOUS | 11 refills | Status: DC

## 2018-11-27 NOTE — Telephone Encounter (Signed)
Called the pt to let them know that they were approved for the repatha and the appeal was overturned good till 11/14/19 sent refill and spoke with pt

## 2018-12-19 ENCOUNTER — Other Ambulatory Visit: Payer: Self-pay | Admitting: Cardiology

## 2018-12-27 ENCOUNTER — Other Ambulatory Visit: Payer: Self-pay | Admitting: Cardiology

## 2019-01-20 NOTE — Progress Notes (Signed)
HPI: FU coronary artery disease. In 2007, the patient had cardiac catheterization that showed an ejection fraction of 45% with severe anterior apical hypokinesis and severe coronary disease. He ultimately underwent coronary artery bypassing graft on June 04, 2005. At that time, he had a saphenous vein graft to the PDA, status vein graft to the diagonal, a RIMA to the OM1, and a LIMA to the LAD. Myoview in March of 2013 showed an ejection fraction of 68%. There was prior infarct but no ischemia. Also with h/o hemchromatosis. Echo 7/17 showed normal LV function, mild LAE.Since I last saw him,the patient denies any dyspnea on exertion, orthopnea, PND, pedal edema, palpitations, syncope or chest pain.   Current Outpatient Medications  Medication Sig Dispense Refill  . aspirin 81 MG tablet Take 1 tablet (81 mg total) by mouth daily.    . Evolocumab (REPATHA SURECLICK) XX123456 MG/ML SOAJ Inject 140 mg into the skin every 14 (fourteen) days. 2 pen 11  . fish oil-omega-3 fatty acids 1000 MG capsule Take 2 g by mouth 2 (two) times daily.      . fluticasone (FLONASE) 50 MCG/ACT nasal spray USE 2 SPRAYS INTO THE NOSE DAILY 16 g 1  . lisinopril (ZESTRIL) 20 MG tablet TAKE ONE TABLET BY MOUTH DAILY 90 tablet 0  . loratadine (CLARITIN) 10 MG tablet Take 10 mg by mouth daily as needed. For allergies    . metoprolol succinate (TOPROL-XL) 25 MG 24 hr tablet Take 1 tablet (25 mg total) by mouth daily. 90 tablet 0  . pantoprazole (PROTONIX) 40 MG tablet Take 1 tablet (40 mg total) by mouth daily. 90 tablet 3  . rosuvastatin (CRESTOR) 5 MG tablet TAKE ONE TABLET BY MOUTH 1-3 TIMES PER WEEK AS TOLERATED 36 tablet 3   No current facility-administered medications for this visit.      Past Medical History:  Diagnosis Date  . Allergy    seasonal  . Amenia    postoperative  . Anemia   . Benign positional vertigo   . CAD (coronary artery disease)    s/p CABG 01/07  . Diverticula of colon   . Esophageal  stricture 08/03/2013  . GERD (gastroesophageal reflux disease)   . Gilbert's syndrome    possible. (elevated bilirubin)  . Hemochromatosis   . Hx of adenomatous colonic polyps    Medoff  . Hyperlipidemia   . Hypertension   . Internal hemorrhoid   . Memory loss   . Myocardial infarction ([place]) 05/2005  . Tobacco abuse   . Vertigo     Past Surgical History:  Procedure Laterality Date  . APPENDECTOMY  2014   lap appy  . COLONOSCOPY  2003, 2007, 02/2011   small adenomas  . CORONARY ARTERY BYPASS GRAFT    . ESOPHAGEAL DILATION  11/19/13  . LAPAROSCOPIC APPENDECTOMY N/A 08/06/2012   Procedure: APPENDECTOMY LAPAROSCOPIC;  Surgeon: Imogene Burn. Georgette Dover, MD;  Location: [facility];  Service: General;  Laterality: N/A;  . left knee arthroscopy    . MEDIAN STERNOTOMY     for CABG x 4 (left internal mammary artery to distal left anterior descending coronary artery, right internal mammary artery to first circumflex marginal branch, saphenous vein graft to posterior descending coronary artery, saphenous vein graft to diagonal branch, endoscopic saphenous vein harvest from right thigh.) SURGEON: Valentina Gu. Roxy Manns, M.D. CHO/MEDQ D:06/08/05. T: 06/09/2005 Job: [id number]  . THERAPEUTIC PHLEBOTOMY  03/21/2015        Social History   Socioeconomic History  .  Marital status: Divorced    Spouse name: Not on file  . Number of children: 1  . Years of education: 82  . Highest education level: Not on file  Occupational History    Comment: Camera operator  . 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 Financial resource strain: Not on file  . Food insecurity    Worry: Not on file    Inability: Not on file  . Transportation needs    Medical: Not on file    Non-medical: Not on file  Tobacco Use  . Smoking status: Former Smoker    Packs/day: 2.00    Years: 36.00    Pack years: 72.00    Types: Cigarettes    Quit date: 03/07/2006    Years since quitting: 12.8  .  Smokeless tobacco: Never Used  . Tobacco comment: smoked since age 9 approx. 1 pact to 1 and a half packs a day. fortunately after his bypass surgery he completely discontinued smoking  Substance and Sexual Activity  . Alcohol use: Yes    Alcohol/week: 4.0 standard drinks    Types: 4 Cans of beer per week    Comment: socially  . Drug use: No  . Sexual activity: Not on file  Lifestyle  . Physical activity    Days per week: Not on file    Minutes per session: Not on file  . Stress: Not on file  Relationships  . Social Herbalist on phone: Not on file    Gets together: Not on file    Attends religious service: Not on file    Active member of club or organization: Not on file    Attends meetings of clubs or organizations: Not on file    Relationship status: Not on file  . Intimate partner violence    Fear of current or ex partner: Not on file    Emotionally abused: Not on file    Physically abused: Not on file    Forced sexual activity: Not on file  Other Topics Concern  . Not on file  Social History Narrative   Lives with girlfriend. Fishing is a hobby.    Fun: Go to the lake every weekend.     Family History  Problem Relation Age of Onset  . Colon polyps Mother   . Colon cancer Paternal Uncle   . Heart attack Maternal Grandfather   . Breast cancer Neg Hx   . Prostate cancer Neg Hx   . Esophageal cancer Neg Hx   . Stomach cancer Neg Hx   . Rectal cancer Neg Hx     ROS: no fevers or chills, productive cough, hemoptysis, dysphasia, odynophagia, melena, hematochezia, dysuria, hematuria, rash, seizure activity, orthopnea, PND, pedal edema, claudication. Remaining systems are negative.  Physical Exam: Well-developed well-nourished in no acute distress.  Skin is warm and dry.  HEENT is normal.  Neck is supple.  Chest is clear to auscultation with normal expansion.  Cardiovascular exam is regular rate and rhythm.  Abdominal exam nontender or distended. No  masses palpated. Extremities show no edema. neuro grossly intact  ECG-sinus bradycardia at a rate of 52, no ST changes.  Personally reviewed  A/P  1 coronary artery disease status post coronary artery bypass and graft-patient doing well with no symptoms.  Continue aspirin and statin.  2 hyperlipidemia-continue low-dose statin and repatha.  He did not tolerate higher doses previously.  3 hypertension-blood pressure is  controlled.  Continue present medications and follow.  Kirk Ruths, MD

## 2019-01-21 ENCOUNTER — Encounter: Payer: Self-pay | Admitting: Cardiology

## 2019-01-21 ENCOUNTER — Other Ambulatory Visit: Payer: Self-pay

## 2019-01-21 ENCOUNTER — Ambulatory Visit: Payer: 59 | Admitting: Cardiology

## 2019-01-21 VITALS — BP 128/76 | HR 52 | Temp 97.3°F | Ht 71.0 in | Wt 236.0 lb

## 2019-01-21 DIAGNOSIS — E785 Hyperlipidemia, unspecified: Secondary | ICD-10-CM | POA: Diagnosis not present

## 2019-01-21 DIAGNOSIS — I2510 Atherosclerotic heart disease of native coronary artery without angina pectoris: Secondary | ICD-10-CM | POA: Diagnosis not present

## 2019-01-21 DIAGNOSIS — I10 Essential (primary) hypertension: Secondary | ICD-10-CM

## 2019-01-21 NOTE — Patient Instructions (Signed)

## 2019-03-02 NOTE — Progress Notes (Signed)
D[facility]   Telephone:(336) [phone] Fax:(336) [phone]   Clinic Follow up Note   Patient Care Team: Marrian Salvage, FNP as PCP - General (Internal Medicine) Gatha Mayer, MD as Consulting Physician (Gastroenterology) Lelon Perla, MD as Consulting Physician (Cardiology)  Date of Service:  03/05/2019  CHIEF COMPLAINT: F/u for hemochromatosis   CURRENT THERAPY:  Phlebotomy as needed  INTERVAL HISTORY:  Dylan Hudson is here for a follow up of hemochromatosis. He presents to the clinic alone. He notes he is doing well. He notes with last phlebotomy he felt lightheaded but notes he drank before and during. I reviewed his medication list with him. He notes his Aspirin was dose reduced to 81mg .    REVIEW OF SYSTEMS:   Constitutional: Denies fevers, chills or abnormal weight loss Eyes: Denies blurriness of vision Ears, nose, mouth, throat, and face: Denies mucositis or sore throat Respiratory: Denies cough, dyspnea or wheezes Cardiovascular: Denies palpitation, chest discomfort or lower extremity swelling Gastrointestinal:  Denies nausea, heartburn or change in bowel habits Skin: Denies abnormal skin rashes Lymphatics: Denies new lymphadenopathy or easy bruising Neurological:Denies numbness, tingling or new weaknesses Behavioral/Psych: Mood is stable, no new changes  All other systems were reviewed with the patient and are negative.  MEDICAL HISTORY:  Past Medical History:  Diagnosis Date  . Allergy    seasonal  . Amenia    postoperative  . Anemia   . Benign positional vertigo   . CAD (coronary artery disease)    s/p CABG 01/07  . Diverticula of colon   . Esophageal stricture 08/03/2013  . GERD (gastroesophageal reflux disease)   . Gilbert's syndrome    possible. (elevated bilirubin)  . Hemochromatosis   . Hx of adenomatous colonic polyps    Medoff  . Hyperlipidemia   . Hypertension   . Internal hemorrhoid   . Memory loss   .  Myocardial infarction ([place]) 05/2005  . Tobacco abuse   . Vertigo     SURGICAL HISTORY: Past Surgical History:  Procedure Laterality Date  . APPENDECTOMY  2014   lap appy  . COLONOSCOPY  2003, 2007, 02/2011   small adenomas  . CORONARY ARTERY BYPASS GRAFT    . ESOPHAGEAL DILATION  11/19/13  . LAPAROSCOPIC APPENDECTOMY N/A 08/06/2012   Procedure: APPENDECTOMY LAPAROSCOPIC;  Surgeon: Imogene Burn. Georgette Dover, MD;  Location: [facility];  Service: General;  Laterality: N/A;  . left knee arthroscopy    . MEDIAN STERNOTOMY     for CABG x 4 (left internal mammary artery to distal left anterior descending coronary artery, right internal mammary artery to first circumflex marginal branch, saphenous vein graft to posterior descending coronary artery, saphenous vein graft to diagonal branch, endoscopic saphenous vein harvest from right thigh.) SURGEON: Valentina Gu. Roxy Manns, M.D. CHO/MEDQ D:06/08/05. T: 06/09/2005 Job: [id number]  . THERAPEUTIC PHLEBOTOMY  03/21/2015        I have reviewed the social history and family history with the patient and they are unchanged from previous note.  ALLERGIES:  is allergic to niacin and related; pravachol [pravastatin]; red yeast rice [cholestin]; and zocor [simvastatin].  MEDICATIONS:  Current Outpatient Medications  Medication Sig Dispense Refill  . aspirin 81 MG tablet Take 1 tablet (81 mg total) by mouth daily.    . Evolocumab (REPATHA SURECLICK) XX123456 MG/ML SOAJ Inject 140 mg into the skin every 14 (fourteen) days. 2 pen 11  . fish oil-omega-3 fatty acids 1000 MG capsule Take 2 g by mouth 2 (  two) times daily.      . fluticasone (FLONASE) 50 MCG/ACT nasal spray USE 2 SPRAYS INTO THE NOSE DAILY 16 g 1  . lisinopril (ZESTRIL) 20 MG tablet TAKE ONE TABLET BY MOUTH DAILY 90 tablet 0  . loratadine (CLARITIN) 10 MG tablet Take 10 mg by mouth daily as needed. For allergies    . metoprolol succinate (TOPROL-XL) 25 MG 24 hr tablet Take 1 tablet (25 mg total) by mouth daily. 90 tablet 0   . pantoprazole (PROTONIX) 40 MG tablet Take 1 tablet (40 mg total) by mouth daily. 90 tablet 3  . rosuvastatin (CRESTOR) 5 MG tablet TAKE ONE TABLET BY MOUTH 1-3 TIMES PER WEEK AS TOLERATED 36 tablet 3   No current facility-administered medications for this visit.     PHYSICAL EXAMINATION: ECOG PERFORMANCE STATUS: 0 - Asymptomatic  Vitals:   03/05/19 1548  BP: 125/77  Pulse: (!) 58  Resp: 18  Temp: 98.3 F (36.8 C)  SpO2: 100%   Filed Weights   03/05/19 1548  Weight: 240 lb (108.9 kg)    GENERAL:alert, no distress and comfortable SKIN: skin color, texture, turgor are normal, no rashes or significant lesions EYES: normal, Conjunctiva are pink and non-injected, sclera clear  NECK: supple, thyroid normal size, non-tender, without nodularity LYMPH:  no palpable lymphadenopathy in the cervical, axillary  LUNGS: clear to auscultation and percussion with normal breathing effort HEART: regular rate & rhythm and no murmurs and no lower extremity edema ABDOMEN:abdomen soft, non-tender and normal bowel sounds Musculoskeletal:no cyanosis of digits and no clubbing  NEURO: alert & oriented x 3 with fluent speech, no focal motor/sensory deficits  LABORATORY DATA:  I have reviewed the data as listed CBC Latest Ref Rng & Units 03/03/2019 11/06/2018 07/03/2018  WBC 4.0 - 10.5 K/uL 5.8 5.7 5.8  Hemoglobin 13.0 - 17.0 g/dL 15.5 16.5 15.3  Hematocrit 39.0 - 52.0 % 45.5 47.8 44.7  Platelets 150 - 400 K/uL 168 175 170     CMP Latest Ref Rng & Units 07/15/2018 10/28/2017 12/06/2016  Glucose 70 - 99 mg/dL - 107(H) 94  BUN 6 - 23 mg/dL - 12 11  Creatinine 0.40 - 1.50 mg/dL - 1.02 1.07  Sodium 135 - 145 mEq/L - 138 140  Potassium 3.5 - 5.1 mEq/L - 4.0 4.3  Chloride 96 - 112 mEq/L - 101 99  CO2 19 - 32 mEq/L - 28 23  Calcium 8.4 - 10.5 mg/dL - 9.5 9.8  Total Protein 6.0 - 8.5 g/dL 6.5 6.8 6.8  Total Bilirubin 0.0 - 1.2 mg/dL 1.7(H) 2.0(H) 1.7(H)  Alkaline Phos 39 - 117 IU/L 56 47 52  AST 0 -  40 IU/L 28 26 37  ALT 0 - 44 IU/L 47(H) 39 62(H)      RADIOGRAPHIC STUDIES: I have personally reviewed the radiological images as listed and agreed with the findings in the report. No results found.   ASSESSMENT & PLAN:  Dylan Hudson is a 58 y.o. male with   1. Hemochromatosis, HFE H63D homozygous -I previously reviewed his medical records including office notes and lab results -He underwent a liver biopsy in 2011, which showed 3-4+ hemosiderin deposition, confirmed hemochromatosis. -His HFE gene mutation test showed H63D/H63D which is not the most common but can cause hemochromatosis.  -I recommended continue phlebotomy, to keep ferritin less than 50 and iron saturation less than 50%, if no significant anemia. -I previously discussed that this is a inheritable disease, given he carries 2 gene mutations,  his children will carry at least one gene mutation. I recommend his children to have the genetic test and get their hemoglobin levels and iron level checked and to avoid excessive iron uptake.  -Continue to monitor his iron and ferritin level every 4 months, and consider phlebotomy if he has ferritin more than 50 or transferrin saturation more than 50%. -Labs review this week, CBC and iron panel overall normal. Ferritin 21. He does not need phlebotomy today.  -Next Liver US in 1 month, will repeat every few years given risk for liver cirrhosis and liver cancer.  -In 2019 Monitor with labs every 4 months. F/u in 1 year.   2. Cancer screenings -his last EGD was in 2015 and his last colonoscopy in 2012.  -I previously recommended he follow up with Dr. Carlean Purl as he is overdue for Colonoscopy.  -I encouraged him to continue to follow up with PCP   3. Iron deficiency - secondary to phlebotomy, he has no overt GI bleeding.  Previous colonoscopy was negative -Resolved after a short course of oral iron.   PLAN:  -Labs reviewed, no phlebotomy today  -Lab every 4 months X3, I will set  up phlebotomy if ferritin >50 or iron saturation more than 50% -US Liver in 1 month  -Lab and f/u in 1 year  -Flu shot today   No problem-specific Assessment & Plan notes found for this encounter.   Orders Placed This Encounter  Procedures  . US Abdomen Complete    Standing Status:   Future    Standing Expiration Date:   03/04/2020    Order Specific Question:   Reason for Exam (SYMPTOM  OR DIAGNOSIS REQUIRED)    Answer:   rule out liver cirrhosis    Order Specific Question:   Preferred imaging location?    Answer:   [facility]   All questions were answered. The patient knows to call the clinic with any problems, questions or concerns. No barriers to learning was detected. I spent 10 minutes counseling the patient face to face. The total time spent in the appointment was 15 minutes and more than 50% was on counseling and review of test results     Truitt Merle, MD 03/05/2019   I, Joslyn Devon, am acting as scribe for Truitt Merle, MD.   I have reviewed the above documentation for accuracy and completeness, and I agree with the above.

## 2019-03-03 ENCOUNTER — Other Ambulatory Visit: Payer: Self-pay

## 2019-03-03 ENCOUNTER — Inpatient Hospital Stay: Payer: 59 | Attending: Hematology

## 2019-03-03 DIAGNOSIS — Z23 Encounter for immunization: Secondary | ICD-10-CM | POA: Diagnosis not present

## 2019-03-03 LAB — CBC WITH DIFFERENTIAL (CANCER CENTER ONLY)
Abs Immature Granulocytes: 0.01 10*3/uL (ref 0.00–0.07)
Basophils Absolute: 0.1 10*3/uL (ref 0.0–0.1)
Basophils Relative: 1 %
Eosinophils Absolute: 0.5 10*3/uL (ref 0.0–0.5)
Eosinophils Relative: 8 %
HCT: 45.5 % (ref 39.0–52.0)
Hemoglobin: 15.5 g/dL (ref 13.0–17.0)
Immature Granulocytes: 0 %
Lymphocytes Relative: 31 %
Lymphs Abs: 1.8 10*3/uL (ref 0.7–4.0)
MCH: 30.9 pg (ref 26.0–34.0)
MCHC: 34.1 g/dL (ref 30.0–36.0)
MCV: 90.6 fL (ref 80.0–100.0)
Monocytes Absolute: 0.8 10*3/uL (ref 0.1–1.0)
Monocytes Relative: 14 %
Neutro Abs: 2.7 10*3/uL (ref 1.7–7.7)
Neutrophils Relative %: 46 %
Platelet Count: 168 10*3/uL (ref 150–400)
RBC: 5.02 MIL/uL (ref 4.22–5.81)
RDW: 12.8 % (ref 11.5–15.5)
WBC Count: 5.8 10*3/uL (ref 4.0–10.5)
nRBC: 0 % (ref 0.0–0.2)

## 2019-03-04 LAB — IRON AND TIBC
Iron: 117 ug/dL (ref 42–163)
Saturation Ratios: 25 % (ref 20–55)
TIBC: 460 ug/dL — ABNORMAL HIGH (ref 202–409)
UIBC: 343 ug/dL (ref 117–376)

## 2019-03-04 LAB — FERRITIN: Ferritin: 21 ng/mL — ABNORMAL LOW (ref 24–336)

## 2019-03-05 ENCOUNTER — Other Ambulatory Visit: Payer: Self-pay

## 2019-03-05 ENCOUNTER — Inpatient Hospital Stay (HOSPITAL_BASED_OUTPATIENT_CLINIC_OR_DEPARTMENT_OTHER): Payer: 59 | Admitting: Hematology

## 2019-03-05 ENCOUNTER — Encounter: Payer: Self-pay | Admitting: Hematology

## 2019-03-05 DIAGNOSIS — Z23 Encounter for immunization: Secondary | ICD-10-CM

## 2019-03-05 MED ORDER — INFLUENZA VAC SPLIT QUAD 0.5 ML IM SUSY
PREFILLED_SYRINGE | INTRAMUSCULAR | Status: AC
  Filled 2019-03-05: qty 0.5

## 2019-03-05 MED ORDER — INFLUENZA VAC SPLIT QUAD 0.5 ML IM SUSY
0.5000 mL | PREFILLED_SYRINGE | Freq: Once | INTRAMUSCULAR | Status: AC
  Administered 2019-03-05: 0.5 mL via INTRAMUSCULAR

## 2019-03-06 ENCOUNTER — Telehealth: Payer: Self-pay | Admitting: Hematology

## 2019-03-06 NOTE — Telephone Encounter (Signed)
Scheduled appt per 10/15 los.  Sent a staff message to get a calendar mailed out.

## 2019-03-20 ENCOUNTER — Other Ambulatory Visit: Payer: Self-pay | Admitting: Cardiology

## 2019-03-26 ENCOUNTER — Other Ambulatory Visit: Payer: Self-pay | Admitting: Cardiology

## 2019-04-20 ENCOUNTER — Other Ambulatory Visit: Payer: Self-pay | Admitting: Internal Medicine

## 2019-07-06 ENCOUNTER — Other Ambulatory Visit: Payer: Self-pay

## 2019-07-06 ENCOUNTER — Inpatient Hospital Stay: Payer: 59 | Attending: Hematology

## 2019-07-06 LAB — CBC WITH DIFFERENTIAL (CANCER CENTER ONLY)
Abs Immature Granulocytes: 0.01 10*3/uL (ref 0.00–0.07)
Basophils Absolute: 0.1 10*3/uL (ref 0.0–0.1)
Basophils Relative: 1 %
Eosinophils Absolute: 0.6 10*3/uL — ABNORMAL HIGH (ref 0.0–0.5)
Eosinophils Relative: 11 %
HCT: 45.8 % (ref 39.0–52.0)
Hemoglobin: 15.7 g/dL (ref 13.0–17.0)
Immature Granulocytes: 0 %
Lymphocytes Relative: 27 %
Lymphs Abs: 1.4 10*3/uL (ref 0.7–4.0)
MCH: 31.4 pg (ref 26.0–34.0)
MCHC: 34.3 g/dL (ref 30.0–36.0)
MCV: 91.6 fL (ref 80.0–100.0)
Monocytes Absolute: 1 10*3/uL (ref 0.1–1.0)
Monocytes Relative: 20 %
Neutro Abs: 2.1 10*3/uL (ref 1.7–7.7)
Neutrophils Relative %: 41 %
Platelet Count: 150 10*3/uL (ref 150–400)
RBC: 5 MIL/uL (ref 4.22–5.81)
RDW: 12.9 % (ref 11.5–15.5)
WBC Count: 5.1 10*3/uL (ref 4.0–10.5)
nRBC: 0 % (ref 0.0–0.2)

## 2019-07-06 LAB — IRON AND TIBC
Iron: 58 ug/dL (ref 42–163)
Saturation Ratios: 13 % — ABNORMAL LOW (ref 20–55)
TIBC: 445 ug/dL — ABNORMAL HIGH (ref 202–409)
UIBC: 387 ug/dL — ABNORMAL HIGH (ref 117–376)

## 2019-07-06 LAB — FERRITIN: Ferritin: 58 ng/mL (ref 24–336)

## 2019-07-07 ENCOUNTER — Encounter: Payer: Self-pay | Admitting: Hematology

## 2019-07-14 ENCOUNTER — Other Ambulatory Visit: Payer: Self-pay | Admitting: Cardiology

## 2019-10-21 ENCOUNTER — Telehealth: Payer: Self-pay

## 2019-10-21 DIAGNOSIS — E785 Hyperlipidemia, unspecified: Secondary | ICD-10-CM

## 2019-10-21 NOTE — Telephone Encounter (Signed)
Called and spoke w/pt regarding getting lipid panel... pt stated that they would complete it tomorrow. Orders placed and pt verbalized understanding

## 2019-10-23 LAB — LIPID PANEL
Chol/HDL Ratio: 3.9 ratio (ref 0.0–5.0)
Cholesterol, Total: 143 mg/dL (ref 100–199)
HDL: 37 mg/dL — ABNORMAL LOW (ref >39)
LDL Chol Calc (NIH): 73 mg/dL (ref 0–99)
Triglycerides: 196 mg/dL — ABNORMAL HIGH (ref 0–149)
VLDL Cholesterol Cal: 33 mg/dL (ref 5–40)

## 2019-10-24 ENCOUNTER — Other Ambulatory Visit: Payer: Self-pay | Admitting: Cardiology

## 2019-11-03 ENCOUNTER — Other Ambulatory Visit: Payer: Self-pay

## 2019-11-03 ENCOUNTER — Inpatient Hospital Stay: Payer: 59 | Attending: Hematology

## 2019-11-03 LAB — CBC WITH DIFFERENTIAL (CANCER CENTER ONLY)
Abs Immature Granulocytes: 0.02 10*3/uL (ref 0.00–0.07)
Basophils Absolute: 0.1 10*3/uL (ref 0.0–0.1)
Basophils Relative: 2 %
Eosinophils Absolute: 0.4 10*3/uL (ref 0.0–0.5)
Eosinophils Relative: 7 %
HCT: 44.8 % (ref 39.0–52.0)
Hemoglobin: 15.7 g/dL (ref 13.0–17.0)
Immature Granulocytes: 0 %
Lymphocytes Relative: 29 %
Lymphs Abs: 1.6 10*3/uL (ref 0.7–4.0)
MCH: 32.2 pg (ref 26.0–34.0)
MCHC: 35 g/dL (ref 30.0–36.0)
MCV: 92 fL (ref 80.0–100.0)
Monocytes Absolute: 0.8 10*3/uL (ref 0.1–1.0)
Monocytes Relative: 14 %
Neutro Abs: 2.6 10*3/uL (ref 1.7–7.7)
Neutrophils Relative %: 48 %
Platelet Count: 168 10*3/uL (ref 150–400)
RBC: 4.87 MIL/uL (ref 4.22–5.81)
RDW: 13.1 % (ref 11.5–15.5)
WBC Count: 5.5 10*3/uL (ref 4.0–10.5)
nRBC: 0 % (ref 0.0–0.2)

## 2019-11-03 LAB — FERRITIN: Ferritin: 28 ng/mL (ref 24–336)

## 2019-11-03 LAB — IRON AND TIBC
Iron: 167 ug/dL — ABNORMAL HIGH (ref 42–163)
Saturation Ratios: 40 % (ref 20–55)
TIBC: 418 ug/dL — ABNORMAL HIGH (ref 202–409)
UIBC: 250 ug/dL (ref 117–376)

## 2019-11-09 ENCOUNTER — Telehealth: Payer: Self-pay | Admitting: Emergency Medicine

## 2019-11-09 NOTE — Telephone Encounter (Addendum)
Pt verbalized understanding of this.   ----- Message from Truitt Merle, MD sent at 11/08/2019  8:08 AM EDT ----- Please let pt know his lab results, no need phlebotomy now, thanks   Truitt Merle

## 2019-11-30 ENCOUNTER — Other Ambulatory Visit: Payer: Self-pay | Admitting: Cardiology

## 2019-12-11 ENCOUNTER — Other Ambulatory Visit: Payer: Self-pay | Admitting: Cardiology

## 2020-01-08 ENCOUNTER — Other Ambulatory Visit: Payer: Self-pay | Admitting: Internal Medicine

## 2020-01-15 ENCOUNTER — Other Ambulatory Visit: Payer: Self-pay | Admitting: Cardiology

## 2020-02-15 NOTE — Progress Notes (Signed)
HPI: FU coronary artery disease. In 2007, the patient had cardiac catheterization that showed an ejection fraction of 45% with severe anterior apical hypokinesis and severe coronary disease. He ultimately underwent coronary artery bypassing graft on June 04, 2005. At that time, he had a saphenous vein graft to the PDA, status vein graft to the diagonal, a RIMA to the OM1, and a LIMA to the LAD. Myoview in March of 2013 showed an ejection fraction of 68%. There was prior infarct but no ischemia. Also with h/o hemchromatosis. Echo 7/17 showed normal LV function, mild LAE.Since I last saw him, the patient denies any dyspnea on exertion, orthopnea, PND, pedal edema, palpitations, syncope or chest pain.   Current Outpatient Medications  Medication Sig Dispense Refill   aspirin 81 MG tablet Take 1 tablet (81 mg total) by mouth daily.     fish oil-omega-3 fatty acids 1000 MG capsule Take 2 g by mouth 2 (two) times daily.       fluticasone (FLONASE) 50 MCG/ACT nasal spray USE 2 SPRAYS INTO THE NOSE DAILY 16 g 1   lisinopril (ZESTRIL) 20 MG tablet TAKE ONE TABLET BY MOUTH DAILY 90 tablet 1   loratadine (CLARITIN) 10 MG tablet Take 10 mg by mouth daily as needed. For allergies     metoprolol succinate (TOPROL-XL) 25 MG 24 hr tablet TAKE ONE TABLET BY MOUTH DAILY 90 tablet 3   pantoprazole (PROTONIX) 40 MG tablet TAKE ONE TABLET BY MOUTH DAILY 90 tablet 0   REPATHA SURECLICK 176 MG/ML SOAJ INJECT 140MG  INTO THE SKIN EVERY 14 DAYS 2 pen 10   rosuvastatin (CRESTOR) 5 MG tablet TAKE 1 TABLET BY MOUTH ONE TO THREE TIMES PER WEEK AS TOLERATED **NEED MD APPOINTMENT FOR REFILLS** 36 tablet 0   No current facility-administered medications for this visit.     Past Medical History:  Diagnosis Date   Allergy    seasonal   Amenia    postoperative   Anemia    Benign positional vertigo    CAD (coronary artery disease)    s/p CABG 01/07   Diverticula of colon    Esophageal stricture  08/03/2013   GERD (gastroesophageal reflux disease)    Gilbert's syndrome    possible. (elevated bilirubin)   Hemochromatosis    Hx of adenomatous colonic polyps    Medoff   Hyperlipidemia    Hypertension    Internal hemorrhoid    Memory loss    Myocardial infarction ([place]) 05/2005   Tobacco abuse    Vertigo     Past Surgical History:  Procedure Laterality Date   APPENDECTOMY  2014   lap appy   COLONOSCOPY  2003, 2007, 02/2011   small adenomas   CORONARY ARTERY BYPASS GRAFT     ESOPHAGEAL DILATION  11/19/13   LAPAROSCOPIC APPENDECTOMY N/A 08/06/2012   Procedure: APPENDECTOMY LAPAROSCOPIC;  Surgeon: Imogene Burn. Tsuei, MD;  Location: [facility];  Service: General;  Laterality: N/A;   left knee arthroscopy     MEDIAN STERNOTOMY     for CABG x 4 (left internal mammary artery to distal left anterior descending coronary artery, right internal mammary artery to first circumflex marginal branch, saphenous vein graft to posterior descending coronary artery, saphenous vein graft to diagonal branch, endoscopic saphenous vein harvest from right thigh.) SURGEON: Valentina Gu. Roxy Manns, M.D. CHO/MEDQ D:06/08/05. T: 06/09/2005 Job: [id number]   THERAPEUTIC PHLEBOTOMY  03/21/2015        Social History   Socioeconomic History  Marital status: Divorced    Spouse name: Not on file   Number of children: 1   Years of education: 12   Highest education level: Not on file  Occupational History    Comment: Camera operator   Occupation: SERVICE MANAGER    Employer: [employer]  Tobacco Use   Smoking status: Former Smoker    Packs/day: 2.00    Years: 36.00    Pack years: 72.00    Types: Cigarettes    Quit date: 03/07/2006    Years since quitting: 13.9   Smokeless tobacco: Never Used   Tobacco comment: smoked since age 52 approx. 1 pact to 1 and a half packs a day. fortunately after his bypass surgery he completely discontinued smoking  Substance and Sexual Activity     Alcohol use: Yes    Alcohol/week: 4.0 standard drinks    Types: 4 Cans of beer per week    Comment: socially   Drug use: No   Sexual activity: Not on file  Other Topics Concern   Not on file  Social History Narrative   Lives with girlfriend. Fishing is a hobby.    Fun: Go to the lake every weekend.    Social Determinants of Health   Financial Resource Strain:    Difficulty of Paying Living Expenses: Not on file  Food Insecurity:    Worried About Charity fundraiser in the Last Year: Not on file   [employer] of Food in the Last Year: Not on file  Transportation Needs:    Lack of Transportation (Medical): Not on file   Lack of Transportation (Non-Medical): Not on file  Physical Activity:    Days of Exercise per Week: Not on file   Minutes of Exercise per Session: Not on file  Stress:    Feeling of Stress : Not on file  Social Connections:    Frequency of Communication with Friends and Family: Not on file   Frequency of Social Gatherings with Friends and Family: Not on file   Attends Religious Services: Not on file   Active Member of Clubs or Organizations: Not on file   Attends Archivist Meetings: Not on file   Marital Status: Not on file  Intimate Partner Violence:    Fear of Current or Ex-Partner: Not on file   Emotionally Abused: Not on file   Physically Abused: Not on file   Sexually Abused: Not on file    Family History  Problem Relation Age of Onset   Colon polyps Mother    Colon cancer Paternal Uncle    Heart attack Maternal Grandfather    Breast cancer Neg Hx    Prostate cancer Neg Hx    Esophageal cancer Neg Hx    Stomach cancer Neg Hx    Rectal cancer Neg Hx     ROS: no fevers or chills, productive cough, hemoptysis, dysphasia, odynophagia, melena, hematochezia, dysuria, hematuria, rash, seizure activity, orthopnea, PND, pedal edema, claudication. Remaining systems are negative.  Physical Exam: Well-developed  well-nourished in no acute distress.  Skin is warm and dry.  HEENT is normal.  Neck is supple.  Chest is clear to auscultation with normal expansion.  Cardiovascular exam is regular rate and rhythm.  Abdominal exam nontender or distended. No masses palpated. Extremities show no edema. neuro grossly intact  ECG-sinus bradycardia at a rate of 56, cannot rule out septal infarct.  Personally reviewed  A/P  1 coronary artery disease-patient denies recurrent chest pain.  Continue aspirin and statin.  2 hypertension-patient's blood pressure is controlled today.  Continue present medical regimen. Check BMET.  3 hyperlipidemia-continue low-dose statin (patient did not tolerate higher doses in the past).  Continue Repatha.  Check LFTs.  Patient had lipids checked June 2021 with total cholesterol 143 and LDL 73.  4 history of hemochromatosis-we will plan to repeat echocardiogram to reassess LV function.  Followed by hematology.  Kirk Ruths, MD

## 2020-02-22 ENCOUNTER — Other Ambulatory Visit: Payer: Self-pay

## 2020-02-22 ENCOUNTER — Encounter: Payer: Self-pay | Admitting: Cardiology

## 2020-02-22 ENCOUNTER — Ambulatory Visit (INDEPENDENT_AMBULATORY_CARE_PROVIDER_SITE_OTHER): Payer: 59 | Admitting: Cardiology

## 2020-02-22 VITALS — BP 114/72 | HR 56 | Ht 70.0 in | Wt 248.0 lb

## 2020-02-22 DIAGNOSIS — E785 Hyperlipidemia, unspecified: Secondary | ICD-10-CM | POA: Diagnosis not present

## 2020-02-22 DIAGNOSIS — I2510 Atherosclerotic heart disease of native coronary artery without angina pectoris: Secondary | ICD-10-CM

## 2020-02-22 DIAGNOSIS — I10 Essential (primary) hypertension: Secondary | ICD-10-CM

## 2020-02-22 LAB — COMPREHENSIVE METABOLIC PANEL
ALT: 62 IU/L — ABNORMAL HIGH (ref 0–44)
AST: 36 IU/L (ref 0–40)
Albumin/Globulin Ratio: 2 (ref 1.2–2.2)
Albumin: 4.6 g/dL (ref 3.8–4.8)
Alkaline Phosphatase: 54 IU/L (ref 44–121)
BUN/Creatinine Ratio: 14 (ref 10–24)
BUN: 14 mg/dL (ref 8–27)
Bilirubin Total: 1.7 mg/dL — ABNORMAL HIGH (ref 0.0–1.2)
CO2: 25 mmol/L (ref 20–29)
Calcium: 9.6 mg/dL (ref 8.6–10.2)
Chloride: 102 mmol/L (ref 96–106)
Creatinine, Ser: 1.03 mg/dL (ref 0.76–1.27)
GFR calc Af Amer: 90 mL/min/{1.73_m2} (ref >59)
GFR calc non Af Amer: 78 mL/min/{1.73_m2} (ref >59)
Globulin, Total: 2.3 g/dL (ref 1.5–4.5)
Glucose: 94 mg/dL (ref 65–99)
Potassium: 4.4 mmol/L (ref 3.5–5.2)
Sodium: 139 mmol/L (ref 134–144)
Total Protein: 6.9 g/dL (ref 6.0–8.5)

## 2020-02-22 NOTE — Patient Instructions (Signed)
  Lab Work: Your physician recommends that you HAVE LAB WORK TODAY  If you have labs (blood work) drawn today and your tests are completely normal, you will receive your results only by: Marland Kitchen MyChart Message (if you have MyChart) OR . A paper copy in the mail If you have any lab test that is abnormal or we need to change your treatment, we will call you to review the results.   Testing/Procedures:  Your physician has requested that you have an echocardiogram. Echocardiography is a painless test that uses sound waves to create images of your heart. It provides your doctor with information about the size and shape of your heart and how well your heart's chambers and valves are working. This procedure takes approximately one hour. There are no restrictions for this procedure.[place]     Follow-Up: At [facility], you and your health needs are our priority.  As part of our continuing mission to provide you with exceptional heart care, we have created designated Provider Care Teams.  These Care Teams include your primary Cardiologist (physician) and Advanced Practice Providers (APPs -  Physician Assistants and Nurse Practitioners) who all work together to provide you with the care you need, when you need it.  We recommend signing up for the patient portal called "MyChart".  Sign up information is provided on this After Visit Summary.  MyChart is used to connect with patients for Virtual Visits (Telemedicine).  Patients are able to view lab/test results, encounter notes, upcoming appointments, etc.  Non-urgent messages can be sent to your provider as well.   To learn more about what you can do with MyChart, go to [email or web address].    Your next appointment:   12 month(s)  The format for your next appointment:   In Person  Provider:   You may see Kirk Ruths MD or one of the following Advanced Practice Providers on your designated Care Team:    Kerin Ransom,  PA-C  [place], [place]  Coletta Memos, [place]

## 2020-03-03 ENCOUNTER — Other Ambulatory Visit: Payer: 59

## 2020-03-03 ENCOUNTER — Ambulatory Visit: Payer: 59 | Admitting: Hematology

## 2020-03-07 NOTE — Progress Notes (Signed)
[place]   Telephone:(336) [phone] [phone]) [phone]   Clinic Follow up Note   Patient Care Team: Marrian Salvage, FNP as PCP - General (Internal Medicine) Stanford Breed Denice Bors, MD as PCP - Cardiology (Cardiology) Gatha Mayer, MD as Consulting Physician (Gastroenterology) Lelon Perla, MD as Consulting Physician (Cardiology)  Date of Service:  03/10/2020  CHIEF COMPLAINT: F/u for hemochromatosis  CURRENT THERAPY:  Phlebotomy as needed if ferritin>50 or transferrin saturation above 50%  INTERVAL HISTORY:  Dylan Hudson is here for a follow up of hemachromatosis. He was last seen by me 1 year ago. He presents to the clinic alone.  He is clinically doing well, denies any pain or other discomfort.  Last phlebotomy was 8 months ago.  Usually requires a slow phlebotomy and some IV fluids due to dizziness.  Review of system otherwise negative.   MEDICAL HISTORY:  Past Medical History:  Diagnosis Date  . Allergy    seasonal  . Amenia    postoperative  . Anemia   . Benign positional vertigo   . CAD (coronary artery disease)    s/p CABG 01/07  . Diverticula of colon   . Esophageal stricture 08/03/2013  . GERD (gastroesophageal reflux disease)   . Gilbert's syndrome    possible. (elevated bilirubin)  . Hemochromatosis   . Hx of adenomatous colonic polyps    Medoff  . Hyperlipidemia   . Hypertension   . Internal hemorrhoid   . Memory loss   . Myocardial infarction ([place]) 05/2005  . Tobacco abuse   . Vertigo     SURGICAL HISTORY: Past Surgical History:  Procedure Laterality Date  . APPENDECTOMY  2014   lap appy  . COLONOSCOPY  2003, 2007, 02/2011   small adenomas  . CORONARY ARTERY BYPASS GRAFT    . ESOPHAGEAL DILATION  11/19/13  . LAPAROSCOPIC APPENDECTOMY N/A 08/06/2012   Procedure: APPENDECTOMY LAPAROSCOPIC;  Surgeon: Imogene Burn. Georgette Dover, MD;  Location: [place];  Service: General;  Laterality: N/A;  . left knee arthroscopy    . MEDIAN  STERNOTOMY     for CABG x 4 (left internal mammary artery to distal left anterior descending coronary artery, right internal mammary artery to first circumflex marginal branch, saphenous vein graft to posterior descending coronary artery, saphenous vein graft to diagonal branch, endoscopic saphenous vein harvest from right thigh.) SURGEON: Valentina Gu. Roxy Manns, M.D. CHO/MEDQ D:06/08/05. T: 06/09/2005 Job: [id number]  . THERAPEUTIC PHLEBOTOMY  03/21/2015        I have reviewed the social history and family history with the patient and they are unchanged from previous note.  ALLERGIES:  is allergic to niacin and related, pravachol [pravastatin], red yeast rice [cholestin], and zocor [simvastatin].  MEDICATIONS:  Current Outpatient Medications  Medication Sig Dispense Refill  . aspirin 81 MG tablet Take 1 tablet (81 mg total) by mouth daily.    . fish oil-omega-3 fatty acids 1000 MG capsule Take 2 g by mouth 2 (two) times daily.      . fluticasone (FLONASE) 50 MCG/ACT nasal spray USE 2 SPRAYS INTO THE NOSE DAILY 16 g 1  . lisinopril (ZESTRIL) 20 MG tablet TAKE ONE TABLET BY MOUTH DAILY 90 tablet 1  . loratadine (CLARITIN) 10 MG tablet Take 10 mg by mouth daily as needed. For allergies    . metoprolol succinate (TOPROL-XL) 25 MG 24 hr tablet TAKE ONE TABLET BY MOUTH DAILY 90 tablet 3  . pantoprazole (PROTONIX) 40 MG tablet TAKE ONE TABLET  BY MOUTH DAILY 90 tablet 0  . REPATHA SURECLICK 604 MG/ML SOAJ INJECT 140MG  INTO THE SKIN EVERY 14 DAYS 2 pen 10  . rosuvastatin (CRESTOR) 5 MG tablet TAKE 1 TABLET BY MOUTH ONE TO THREE TIMES PER WEEK AS TOLERATED **NEED MD APPOINTMENT FOR REFILLS** 36 tablet 0   No current facility-administered medications for this visit.    PHYSICAL EXAMINATION: ECOG PERFORMANCE STATUS: 0  Vitals:   03/10/20 0938  BP: 128/78  Pulse: (!) 59  Resp: 16  Temp: 98.4 F (36.9 C)  SpO2: 100%   Filed Weights   03/10/20 0938  Weight: 238 lb 4.8 oz (108.1 kg)     GENERAL:alert, no distress and comfortable SKIN: skin color, texture, turgor are normal, no rashes or significant lesions EYES: normal, Conjunctiva are pink and non-injected, sclera clear Musculoskeletal:no cyanosis of digits and no clubbing  NEURO: alert & oriented x 3 with fluent speech, no focal motor/sensory deficits  LABORATORY DATA:  I have reviewed the data as listed CBC Latest Ref Rng & Units 03/10/2020 11/03/2019 07/06/2019  WBC 4.0 - 10.5 K/uL 5.7 5.5 5.1  Hemoglobin 13.0 - 17.0 g/dL 16.2 15.7 15.7  Hematocrit 39 - 52 % 47.2 44.8 45.8  Platelets 150 - 400 K/uL 177 168 150     CMP Latest Ref Rng & Units 02/22/2020 07/15/2018 10/28/2017  Glucose 65 - 99 mg/dL 94 - 107(H)  BUN 8 - 27 mg/dL 14 - 12  Creatinine 0.76 - 1.27 mg/dL 1.03 - 1.02  Sodium 134 - 144 mmol/L 139 - 138  Potassium 3.5 - 5.2 mmol/L 4.4 - 4.0  Chloride 96 - 106 mmol/L 102 - 101  CO2 20 - 29 mmol/L 25 - 28  Calcium 8.6 - 10.2 mg/dL 9.6 - 9.5  Total Protein 6.0 - 8.5 g/dL 6.9 6.5 6.8  Total Bilirubin 0.0 - 1.2 mg/dL 1.7(H) 1.7(H) 2.0(H)  Alkaline Phos 44 - 121 IU/L 54 56 47  AST 0 - 40 IU/L 36 28 26  ALT 0 - 44 IU/L 62(H) 47(H) 39      RADIOGRAPHIC STUDIES: I have personally reviewed the radiological images as listed and agreed with the findings in the report. ECHOCARDIOGRAM COMPLETE  Result Date: 03/08/2020    ECHOCARDIOGRAM REPORT   Patient Name:   [name] Date of Exam: 03/08/2020 Medical Rec #:  [id number]      Height:       70.0 in Accession #:    [id number]     Weight:       248.0 lb Date of Birth:  [birth year]      BSA:          2.287 m Patient Age:    67 years       BP:           114/72 mmHg Patient Gender: M              HR:           60 bpm. Exam Location:  [facility] Procedure: 2D Echo, Cardiac Doppler and Color Doppler Indications:    I25.10 Coronary artery disease.  History:        Patient has prior history of Echocardiogram examinations, most                 recent 12/14/2015. Prior  CABG; Risk Factors:Hypertension,                 Dyslipidemia and Current Smoker. Myocardial Infarction. Coronary  artery disease.  Sonographer:    Wilford Sports Rodgers-Jones RDCS Referring Phys: [place]  1. Left ventricular ejection fraction, by estimation, is 60 to 65%. The left ventricle has normal function. The left ventricle has no regional wall motion abnormalities. Left ventricular diastolic parameters were normal.  2. Right ventricular systolic function is normal. The right ventricular size is normal. There is normal pulmonary artery systolic pressure.  3. Right atrial size was moderately dilated.  4. The mitral valve is normal in structure. No evidence of mitral valve regurgitation. No evidence of mitral stenosis.  5. The aortic valve is tricuspid. Aortic valve regurgitation is not visualized. No aortic stenosis is present.  6. Aortic dilatation noted. There is mild dilatation of the ascending aorta, measuring 40 mm.  7. The inferior vena cava is normal in size with greater than 50% respiratory variability, suggesting right atrial pressure of 3 mmHg. FINDINGS  Left Ventricle: Left ventricular ejection fraction, by estimation, is 60 to 65%. The left ventricle has normal function. The left ventricle has no regional wall motion abnormalities. The left ventricular internal cavity size was normal in size. There is  no left ventricular hypertrophy. Left ventricular diastolic parameters were normal. Normal left ventricular filling pressure. Right Ventricle: The right ventricular size is normal. No increase in right ventricular wall thickness. Right ventricular systolic function is normal. There is normal pulmonary artery systolic pressure. The tricuspid regurgitant velocity is 1.87 m/s, and  with an assumed right atrial pressure of 3 mmHg, the estimated right ventricular systolic pressure is 82.9 mmHg. Left Atrium: Left atrial size was normal in size. Right Atrium: Right atrial  size was moderately dilated. Pericardium: There is no evidence of pericardial effusion. Mitral Valve: The mitral valve is normal in structure. No evidence of mitral valve regurgitation. No evidence of mitral valve stenosis. Tricuspid Valve: The tricuspid valve is normal in structure. Tricuspid valve regurgitation is trivial. No evidence of tricuspid stenosis. Aortic Valve: The aortic valve is tricuspid. Aortic valve regurgitation is not visualized. No aortic stenosis is present. Pulmonic Valve: The pulmonic valve was normal in structure. Pulmonic valve regurgitation is not visualized. No evidence of pulmonic stenosis. Aorta: The ascending aorta was not well visualized and aortic dilatation noted. There is mild dilatation of the ascending aorta, measuring 40 mm. Venous: The inferior vena cava is normal in size with greater than 50% respiratory variability, suggesting right atrial pressure of 3 mmHg. IAS/Shunts: No atrial level shunt detected by color flow Doppler.  LEFT VENTRICLE PLAX 2D LVIDd:         5.40 cm  Diastology LVIDs:         2.90 cm  LV e' medial:    7.83 cm/s LV PW:         0.80 cm  LV E/e' medial:  9.4 LV IVS:        0.70 cm  LV e' lateral:   10.20 cm/s LVOT diam:     2.10 cm  LV E/e' lateral: 7.2 LV SV:         79 LV SV Index:   35 LVOT Area:     3.46 cm  RIGHT VENTRICLE RV Basal diam:  4.90 cm RV S prime:     16.00 cm/s TAPSE (M-mode): 2.5 cm LEFT ATRIUM             Index       RIGHT ATRIUM           Index LA diam:  4.70 cm 2.05 cm/m  RA Area:     24.50 cm LA Vol (A2C):   37.8 ml 16.53 ml/m RA Volume:   87.60 ml  38.30 ml/m LA Vol (A4C):   46.6 ml 20.37 ml/m LA Biplane Vol: 42.8 ml 18.71 ml/m  AORTIC VALVE LVOT Vmax:   99.65 cm/s LVOT Vmean:  72.000 cm/s LVOT VTI:    0.228 m  AORTA Ao Root diam: 4.00 cm Ao Asc diam:  4.00 cm MITRAL VALVE               TRICUSPID VALVE MV Area (PHT): 3.53 cm    TR Peak grad:   14.0 mmHg MV Decel Time: 215 msec    TR Vmax:        187.00 cm/s MV E velocity:  73.30 cm/s MV A velocity: 56.00 cm/s  SHUNTS MV E/A ratio:  1.31        Systemic VTI:  0.23 m                            Systemic Diam: 2.10 cm Skeet Latch MD Electronically signed by Skeet Latch MD Signature Date/Time: 03/08/2020/5:56:28 PM    Final      ASSESSMENT & PLAN:  DIVANTE KOTCH is a 59 y.o. male with    1. Hemochromatosis, HFE H63D homozygous -He underwent a liver biopsy in 2011, which showed 3-4+ hemosiderin deposition, confirmed hemochromatosis. -His HFE gene mutation test showed H63D/H63D which is not the most common but can cause hemochromatosis.  -I recommended continue phlebotomy, to keep ferritin less than 50 and iron saturation less than 50%, if no significant anemia. -I previously discussed that this is a inheritable disease, given he carries 2 gene mutations, his children will carry at least one gene mutation. I recommend his childrento have the genetic test andget their hemoglobin levels and iron level checked and to avoid excessive iron uptake.  -Continue to monitor his iron and ferritin level every3months, and consider phlebotomy if he has ferritin more than 50 or transferrin saturation more than 50%. -he is clinically doing well.  Ferritin ferritin 42, transferrin saturation 31%, he does not need phlebotomy today. -Next Liver US in 2-4 weeks, will repeat every few years given risk for liver cirrhosis and liver cancer.   2. Cancer screenings -his last EGD was in 2015 and his last colonoscopy in 2012.  -I previously recommended he follow up with Dr. Carlean Purl as he is overdue for Colonoscopy.  -I encouraged him to continue to follow up with PCP    PLAN:  -Labs reviewed, no phlebotomy today  -Lab every 9months X3, I will set up phlebotomy if ferritin >50 or iron saturation more than 50% -US Liver in 2 weeks, ordered today  -Lab and f/u in 1 year    No problem-specific Assessment & Plan notes found for this encounter.   Orders Placed This Encounter   Procedures  . US Abdomen Complete    Standing Status:   Future    Standing Expiration Date:   03/10/2021    Order Specific Question:   Reason for Exam (SYMPTOM  OR DIAGNOSIS REQUIRED)    Answer:   hemochromatosis, rule out liver cirrhosis    Order Specific Question:   Preferred imaging location?    Answer:   [facility]    Order Specific Question:   Release to patient    Answer:   Immediate   All questions were answered.  The patient knows to call the clinic with any problems, questions or concerns. No barriers to learning was detected. The total time spent in the appointment was 20 minutes.     Truitt Merle, MD 03/10/2020   I, Joslyn Devon, am acting as scribe for Truitt Merle, MD.   I have reviewed the above documentation for accuracy and completeness, and I agree with the above.

## 2020-03-08 ENCOUNTER — Other Ambulatory Visit: Payer: Self-pay

## 2020-03-08 ENCOUNTER — Ambulatory Visit (HOSPITAL_COMMUNITY): Payer: 59 | Attending: Cardiovascular Disease

## 2020-03-08 DIAGNOSIS — I2510 Atherosclerotic heart disease of native coronary artery without angina pectoris: Secondary | ICD-10-CM

## 2020-03-08 LAB — ECHOCARDIOGRAM COMPLETE
Area-P 1/2: 3.53 cm2
S' Lateral: 2.9 cm

## 2020-03-10 ENCOUNTER — Inpatient Hospital Stay: Payer: 59 | Attending: Hematology

## 2020-03-10 ENCOUNTER — Encounter: Payer: Self-pay | Admitting: Hematology

## 2020-03-10 ENCOUNTER — Inpatient Hospital Stay: Payer: 59 | Admitting: Hematology

## 2020-03-10 ENCOUNTER — Other Ambulatory Visit: Payer: Self-pay

## 2020-03-10 ENCOUNTER — Telehealth: Payer: Self-pay | Admitting: Hematology

## 2020-03-10 DIAGNOSIS — Z23 Encounter for immunization: Secondary | ICD-10-CM | POA: Insufficient documentation

## 2020-03-10 LAB — CBC WITH DIFFERENTIAL (CANCER CENTER ONLY)
Abs Immature Granulocytes: 0.01 10*3/uL (ref 0.00–0.07)
Basophils Absolute: 0.1 10*3/uL (ref 0.0–0.1)
Basophils Relative: 1 %
Eosinophils Absolute: 0.4 10*3/uL (ref 0.0–0.5)
Eosinophils Relative: 6 %
HCT: 47.2 % (ref 39.0–52.0)
Hemoglobin: 16.2 g/dL (ref 13.0–17.0)
Immature Granulocytes: 0 %
Lymphocytes Relative: 27 %
Lymphs Abs: 1.6 10*3/uL (ref 0.7–4.0)
MCH: 31.8 pg (ref 26.0–34.0)
MCHC: 34.3 g/dL (ref 30.0–36.0)
MCV: 92.5 fL (ref 80.0–100.0)
Monocytes Absolute: 0.8 10*3/uL (ref 0.1–1.0)
Monocytes Relative: 14 %
Neutro Abs: 2.9 10*3/uL (ref 1.7–7.7)
Neutrophils Relative %: 52 %
Platelet Count: 177 10*3/uL (ref 150–400)
RBC: 5.1 MIL/uL (ref 4.22–5.81)
RDW: 12.7 % (ref 11.5–15.5)
WBC Count: 5.7 10*3/uL (ref 4.0–10.5)
nRBC: 0 % (ref 0.0–0.2)

## 2020-03-10 LAB — IRON AND TIBC
Iron: 138 ug/dL (ref 42–163)
Saturation Ratios: 31 % (ref 20–55)
TIBC: 445 ug/dL — ABNORMAL HIGH (ref 202–409)
UIBC: 307 ug/dL (ref 117–376)

## 2020-03-10 LAB — FERRITIN: Ferritin: 42 ng/mL (ref 24–336)

## 2020-03-10 MED ORDER — INFLUENZA VAC SPLIT QUAD 0.5 ML IM SUSY
0.5000 mL | PREFILLED_SYRINGE | Freq: Once | INTRAMUSCULAR | Status: AC
  Administered 2020-03-10: 0.5 mL via INTRAMUSCULAR

## 2020-03-10 MED ORDER — INFLUENZA VAC SPLIT QUAD 0.5 ML IM SUSY
PREFILLED_SYRINGE | INTRAMUSCULAR | Status: AC
  Filled 2020-03-10: qty 0.5

## 2020-03-10 NOTE — Telephone Encounter (Signed)
Scheduled per los. Declined printout  

## 2020-03-11 ENCOUNTER — Encounter: Payer: Self-pay | Admitting: Hematology

## 2020-03-24 ENCOUNTER — Ambulatory Visit (HOSPITAL_COMMUNITY)
Admission: RE | Admit: 2020-03-24 | Discharge: 2020-03-24 | Disposition: A | Discharge status: Home / Self Care | Payer: 59 | Source: Ambulatory Visit | Attending: Hematology | Admitting: Hematology

## 2020-03-24 ENCOUNTER — Other Ambulatory Visit: Payer: Self-pay

## 2020-04-07 ENCOUNTER — Other Ambulatory Visit: Payer: Self-pay | Admitting: Cardiology

## 2020-04-07 ENCOUNTER — Other Ambulatory Visit: Payer: Self-pay | Admitting: Internal Medicine

## 2020-04-11 ENCOUNTER — Other Ambulatory Visit: Payer: Self-pay | Admitting: Cardiology

## 2020-07-06 ENCOUNTER — Other Ambulatory Visit: Payer: Self-pay | Admitting: Internal Medicine

## 2020-07-06 ENCOUNTER — Other Ambulatory Visit: Payer: Self-pay | Admitting: Cardiology

## 2020-07-11 ENCOUNTER — Other Ambulatory Visit: Payer: Self-pay | Admitting: Cardiology

## 2020-07-11 ENCOUNTER — Inpatient Hospital Stay: Payer: Self-pay | Attending: Hematology

## 2020-07-11 ENCOUNTER — Other Ambulatory Visit: Payer: Self-pay

## 2020-07-11 LAB — CBC WITH DIFFERENTIAL (CANCER CENTER ONLY)
Abs Immature Granulocytes: 0.02 10*3/uL (ref 0.00–0.07)
Basophils Absolute: 0.1 10*3/uL (ref 0.0–0.1)
Basophils Relative: 1 %
Eosinophils Absolute: 0.4 10*3/uL (ref 0.0–0.5)
Eosinophils Relative: 7 %
HCT: 45 % (ref 39.0–52.0)
Hemoglobin: 15.7 g/dL (ref 13.0–17.0)
Immature Granulocytes: 0 %
Lymphocytes Relative: 29 %
Lymphs Abs: 1.7 10*3/uL (ref 0.7–4.0)
MCH: 32.1 pg (ref 26.0–34.0)
MCHC: 34.9 g/dL (ref 30.0–36.0)
MCV: 92 fL (ref 80.0–100.0)
Monocytes Absolute: 0.9 10*3/uL (ref 0.1–1.0)
Monocytes Relative: 15 %
Neutro Abs: 2.9 10*3/uL (ref 1.7–7.7)
Neutrophils Relative %: 48 %
Platelet Count: 184 10*3/uL (ref 150–400)
RBC: 4.89 MIL/uL (ref 4.22–5.81)
RDW: 13.2 % (ref 11.5–15.5)
WBC Count: 6 10*3/uL (ref 4.0–10.5)
nRBC: 0 % (ref 0.0–0.2)

## 2020-07-11 LAB — IRON AND TIBC
Iron: 129 ug/dL (ref 42–163)
Saturation Ratios: 28 % (ref 20–55)
TIBC: 454 ug/dL — ABNORMAL HIGH (ref 202–409)
UIBC: 325 ug/dL (ref 117–376)

## 2020-07-11 LAB — FERRITIN: Ferritin: 35 ng/mL (ref 24–336)

## 2020-07-12 ENCOUNTER — Telehealth: Payer: Self-pay

## 2020-07-12 NOTE — Telephone Encounter (Signed)
-----   Message from Truitt Merle, MD sent at 07/12/2020  1:02 PM EST ----- Please let pt know his lab results, iron level good, no need phlebotomy now. Please change his next OV to lacie, thanks   [employer]

## 2020-07-12 NOTE — Telephone Encounter (Signed)
Called left message for pt to return call to review lab results per Dr. Burr Medico Please let pt know his lab results, iron level good, no need phlebotomy now.  Awaiting return call

## 2020-07-13 ENCOUNTER — Telehealth: Payer: Self-pay

## 2020-07-13 NOTE — Telephone Encounter (Signed)
-----   Message from Truitt Merle, MD sent at 07/12/2020  1:02 PM EST ----- Please let pt know his lab results, iron level good, no need phlebotomy now. Please change his next OV to lacie, thanks   [employer]

## 2020-07-13 NOTE — Telephone Encounter (Signed)
I spoke with Dylan Hudson and relayed Dr Ernestina Penna comments and recommendations.  He verbalized understanding.  Scheduling message sent to change ov.

## 2020-07-14 ENCOUNTER — Telehealth: Payer: Self-pay | Admitting: Nurse Practitioner

## 2020-07-14 NOTE — Telephone Encounter (Signed)
Contacted patient about moved appointment per provider request. Patient is aware.

## 2020-07-29 ENCOUNTER — Other Ambulatory Visit: Payer: Self-pay | Admitting: Internal Medicine

## 2020-08-10 ENCOUNTER — Encounter: Payer: Self-pay | Admitting: Internal Medicine

## 2020-08-10 ENCOUNTER — Ambulatory Visit (INDEPENDENT_AMBULATORY_CARE_PROVIDER_SITE_OTHER): Payer: Self-pay | Admitting: Internal Medicine

## 2020-08-10 ENCOUNTER — Other Ambulatory Visit: Payer: Self-pay

## 2020-08-10 VITALS — BP 110/70 | HR 67 | Ht 70.0 in | Wt 242.0 lb

## 2020-08-10 DIAGNOSIS — Z6834 Body mass index (BMI) 34.0-34.9, adult: Secondary | ICD-10-CM

## 2020-08-10 DIAGNOSIS — E8881 Metabolic syndrome: Secondary | ICD-10-CM

## 2020-08-10 DIAGNOSIS — E669 Obesity, unspecified: Secondary | ICD-10-CM

## 2020-08-10 DIAGNOSIS — K219 Gastro-esophageal reflux disease without esophagitis: Secondary | ICD-10-CM

## 2020-08-10 MED ORDER — PANTOPRAZOLE SODIUM 40 MG PO TBEC: 40.0000 mg | DELAYED_RELEASE_TABLET | Freq: Every day | ORAL | 3 refills | Status: DC

## 2020-08-10 NOTE — Patient Instructions (Addendum)
Normal BMI (Body Mass Index- based on height and weight) is between 19 and 25. Your BMI today is Body mass index is 34.72 kg/m. Marland Kitchen Please consider follow up  regarding your BMI with your Primary Care Provider.  Work on how you eat as we discussed.  Eliminate bread as much as you can. Consider eating fish instead of taking fish oil.   We have sent the following medications to your pharmacy for you to pick up at your convenience: Pantoprazole  I appreciate the opportunity to care for you. Silvano Rusk, MD, [facility]

## 2020-08-10 NOTE — Progress Notes (Signed)
Dylan Hudson 60 y.o. 07/10/1960 [id number]  Assessment & Plan:   Encounter Diagnoses  Name Primary?  . Gastroesophageal reflux disease, unspecified whether esophagitis present Yes  . Abdominal obesity and metabolic syndrome   . Body mass index (BMI) 34.0-34.9, adult    We discussed his abdominal obesity.  And his diet as below.  I have recommended at a minimum he tried to greatly reduce and hopefully eliminate bread for the most part.  I think that will make a big difference. I have suggested fish oil is probably not helpful in his case and recommended he try to eat fish instead. Read The Obesity Code by Dr. Sharman Hudson  and implement  suggestions. Investigate and sign up for the [email or web address] website if desired and utilize those resources. Checkout Dr. Luis Hudson on YouTube  I have provided handouts on insulin resistance, restricted feeding/intermittent fasting, and proper food choices to lower and eliminate insulin resistance and lose weight. Have a long-term approach to this and do not expect rapid results but have a 1 to 2-year timeframe to change her eating and to become fat adapted.  Meds ordered this encounter  Medications  . pantoprazole (PROTONIX) 40 MG tablet    Sig: Take 1 tablet (40 mg total) by mouth daily before breakfast.    Dispense:  90 tablet    Refill:  3     Subjective:   Chief Complaint:  HPI Dylan Hudson is here for follow-up of his GERD.  He also has hereditary hemochromatosis which has been managed by Dr. Burr Hudson.  He has been limiting iron by not eating red meat and he has not needed phlebotomy in a year.  His reflux is under control.  He would like a refill of his pantoprazole.   Other medical problems include history of colon polyps due for recall colonoscopy in 2024, coronary atherosclerosis with metabolic syndrome and on Repatha and hemorrhoids with bleeding status post banding.  Diet discussion reveals he eats sandwiches twice a day and egg sandwich in  the morning on the way to work and often a sandwich at work.  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  He does not drink sodas almost all water.  He has orange juice in the morning only when he takes his medications.  He has eliminated red meat due to the iron load.   Allergies  Allergen Reactions  . Niacin And Related Other (See Comments)    Chest pain  . Pravachol [Pravastatin]     Myalgia   . Red Yeast Rice [Cholestin]     Jitters   . Zocor [Simvastatin]     REACTION: Myalgias   Current Meds  Medication Sig  . aspirin 81 MG tablet Take 1 tablet (81 mg total) by mouth daily.  . fish oil-omega-3 fatty acids 1000 MG capsule Take 2 g by mouth 2 (two) times daily.  . fluticasone (FLONASE) 50 MCG/ACT nasal spray USE 2 SPRAYS INTO THE NOSE DAILY  . lisinopril (ZESTRIL) 20 MG tablet TAKE ONE TABLET BY MOUTH DAILY  . loratadine (CLARITIN) 10 MG tablet Take 10 mg by mouth daily as needed. For allergies  . metoprolol succinate (TOPROL-XL) 25 MG 24 hr tablet TAKE ONE TABLET BY MOUTH DAILY  . pantoprazole (PROTONIX) 40 MG tablet Take 1 tablet (40 mg total) by mouth daily before breakfast.  . REPATHA SURECLICK 657 MG/ML SOAJ INJECT 140MG  INTO THE SKIN EVERY 14 DAYS  .  rosuvastatin (CRESTOR) 5 MG tablet TAKE ONE TABLET BY MOUTH ONE TO THREE TIMES PER WEEK AS TOLERATED   Past Medical History:  Diagnosis Date  . Allergy    seasonal  . Amenia    postoperative  . Anemia   . Benign positional vertigo   . CAD (coronary artery disease)    s/p CABG 01/07  . Diverticula of colon   . Esophageal stricture 08/03/2013  . GERD (gastroesophageal reflux disease)   . Gilbert's syndrome    possible. (elevated bilirubin)  . Hemochromatosis   . Hx of adenomatous colonic polyps    Medoff  . Hyperlipidemia   . Hypertension   . Internal hemorrhoid   . Memory loss   . Myocardial infarction ([place]) 05/2005  . Tobacco abuse   . Vertigo     Past Surgical History:  Procedure Laterality Date  . APPENDECTOMY  2014   lap appy  . COLONOSCOPY  2003, 2007, 02/2011   small adenomas  . CORONARY ARTERY BYPASS GRAFT    . ESOPHAGEAL DILATION  11/19/13  . LAPAROSCOPIC APPENDECTOMY N/A 08/06/2012   Procedure: APPENDECTOMY LAPAROSCOPIC;  Surgeon: Imogene Burn. Georgette Dover, MD;  Location: [place];  Service: General;  Laterality: N/A;  . left knee arthroscopy    . MEDIAN STERNOTOMY     for CABG x 4 (left internal mammary artery to distal left anterior descending coronary artery, right internal mammary artery to first circumflex marginal branch, saphenous vein graft to posterior descending coronary artery, saphenous vein graft to diagonal branch, endoscopic saphenous vein harvest from right thigh.) SURGEON: Valentina Gu. Roxy Manns, M.D. CHO/MEDQ D:06/08/05. T: 06/09/2005 Job: [id number]  . THERAPEUTIC PHLEBOTOMY  03/21/2015       Social History   Social History Narrative   Lives with girlfriend. Fishing is a hobby.    Fun: Go to the lake every weekend.    family history includes Colon cancer in his paternal uncle; Colon polyps in his mother; Heart attack in his maternal grandfather.   Review of Systems As per HPI  Objective:   Physical Exam BP 110/70   Pulse 67   Ht 5\' 10"  (1.778 m)   Wt 242 lb (109.8 kg)   BMI 34.72 kg/m  Well-developed well-nourished with abdominal obesity

## 2020-10-31 ENCOUNTER — Telehealth: Payer: Self-pay

## 2020-10-31 NOTE — Telephone Encounter (Signed)
I along w/[facility] called pt to let them know that they have the incorrect dob at the insurance company and that is why we are having issues processing pa. We left a msg and hopefully it gets resolved as I cannot process a pa until its resolved

## 2020-11-07 ENCOUNTER — Inpatient Hospital Stay: Payer: Self-pay | Attending: Nurse Practitioner

## 2020-11-07 ENCOUNTER — Other Ambulatory Visit: Payer: Self-pay

## 2020-11-07 LAB — CBC WITH DIFFERENTIAL (CANCER CENTER ONLY)
Abs Immature Granulocytes: 0.01 10*3/uL (ref 0.00–0.07)
Basophils Absolute: 0.1 10*3/uL (ref 0.0–0.1)
Basophils Relative: 1 %
Eosinophils Absolute: 0.3 10*3/uL (ref 0.0–0.5)
Eosinophils Relative: 5 %
HCT: 44.6 % (ref 39.0–52.0)
Hemoglobin: 15.5 g/dL (ref 13.0–17.0)
Immature Granulocytes: 0 %
Lymphocytes Relative: 31 %
Lymphs Abs: 1.7 10*3/uL (ref 0.7–4.0)
MCH: 31.8 pg (ref 26.0–34.0)
MCHC: 34.8 g/dL (ref 30.0–36.0)
MCV: 91.6 fL (ref 80.0–100.0)
Monocytes Absolute: 0.7 10*3/uL (ref 0.1–1.0)
Monocytes Relative: 12 %
Neutro Abs: 2.9 10*3/uL (ref 1.7–7.7)
Neutrophils Relative %: 51 %
Platelet Count: 163 10*3/uL (ref 150–400)
RBC: 4.87 MIL/uL (ref 4.22–5.81)
RDW: 12.6 % (ref 11.5–15.5)
WBC Count: 5.6 10*3/uL (ref 4.0–10.5)
nRBC: 0 % (ref 0.0–0.2)

## 2020-11-07 LAB — IRON AND TIBC
Iron: 93 ug/dL (ref 45–182)
Saturation Ratios: 20 % (ref 17.9–39.5)
TIBC: 469 ug/dL — ABNORMAL HIGH (ref 250–450)
UIBC: 376 ug/dL

## 2020-11-07 LAB — FERRITIN: Ferritin: 17 ng/mL — ABNORMAL LOW (ref 24–336)

## 2020-11-08 ENCOUNTER — Telehealth: Payer: Self-pay | Admitting: *Deleted

## 2020-11-08 NOTE — Telephone Encounter (Signed)
Called pt & informed per Dr Burr Medico that iron good & no need for phlebotomy this time.

## 2020-11-08 NOTE — Telephone Encounter (Signed)
-----   Message from Truitt Merle, MD sent at 11/08/2020  7:08 AM EDT ----- Please let pt know his iron level is good this time, no need phlebotomy, thanks  Truitt Merle  11/08/2020

## 2020-12-17 ENCOUNTER — Other Ambulatory Visit: Payer: Self-pay | Admitting: Cardiology

## 2021-02-07 IMAGING — US US ABDOMEN COMPLETE
1 series · 14 of 25 positions shown · non-contrast
Comparison: CT abdomen pelvis 08/06/2012

CLINICAL DATA: Hemochromatosis

EXAM:
ABDOMEN ULTRASOUND COMPLETE

[Series 1: us abdomen complete · 14 of 86 slices shown]
[im 1/86]
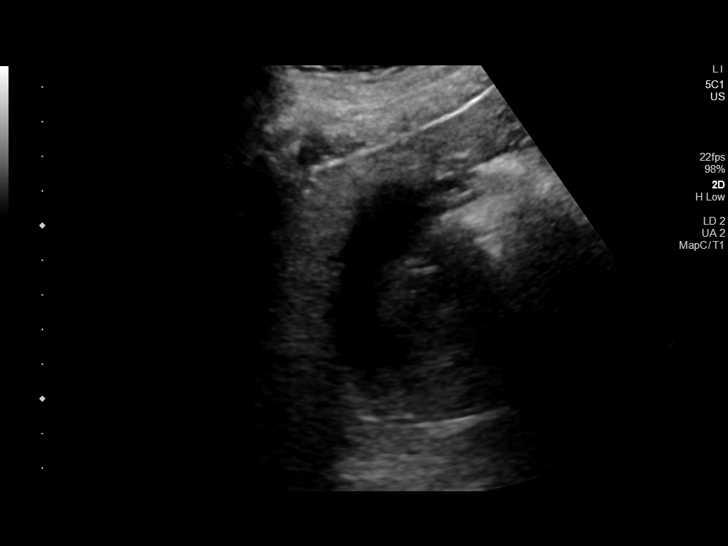
[im 8/86]
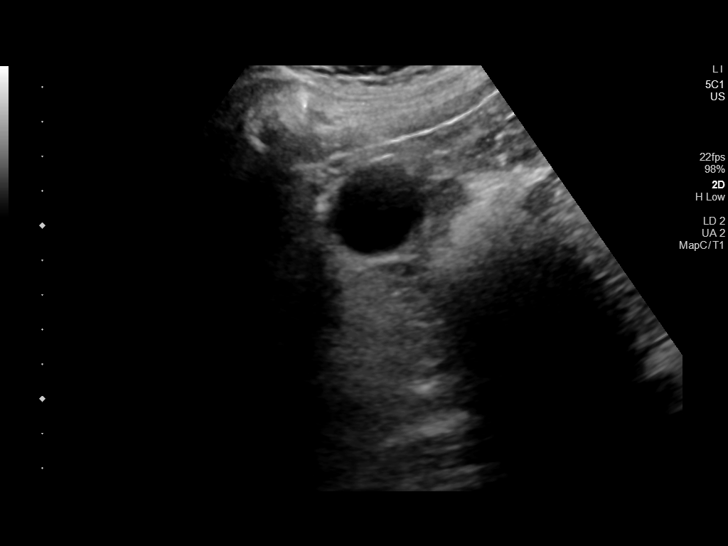
[im 15/86]
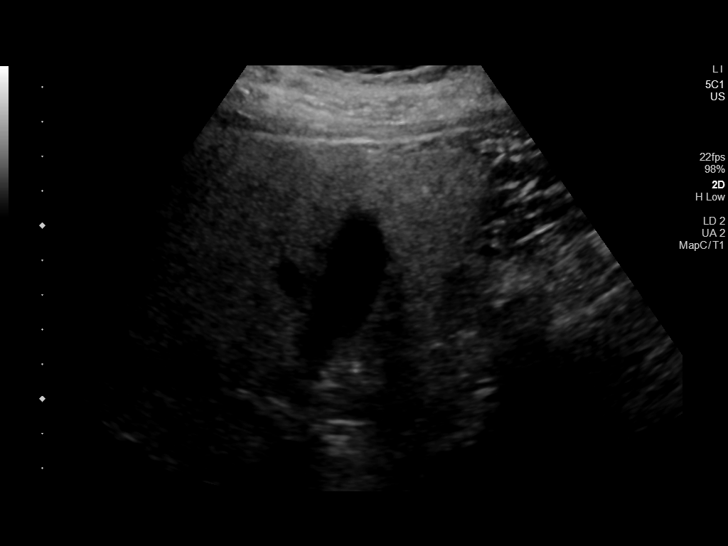
[im 22/86]
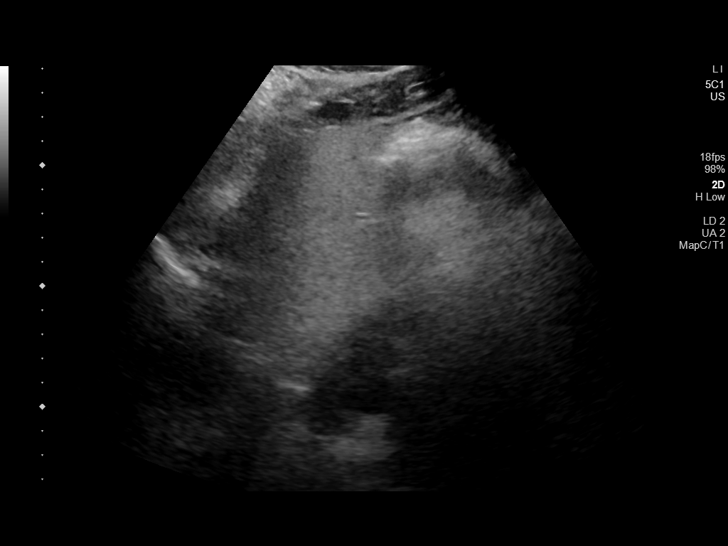
[im 29/86]
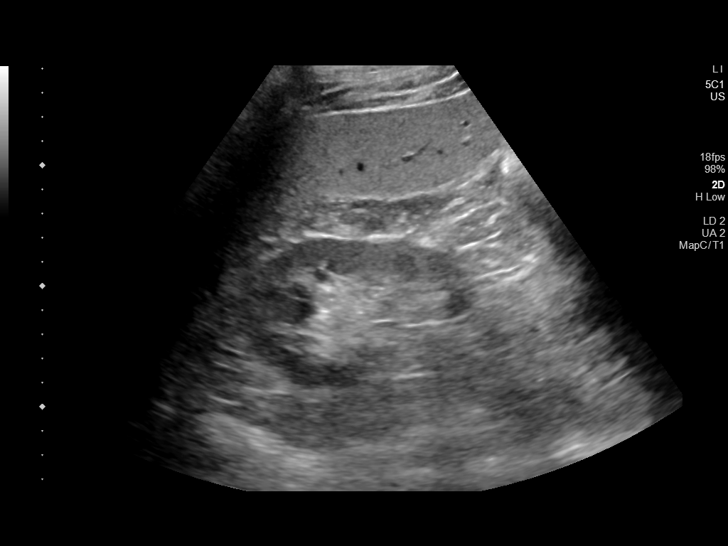
[im 32/86]
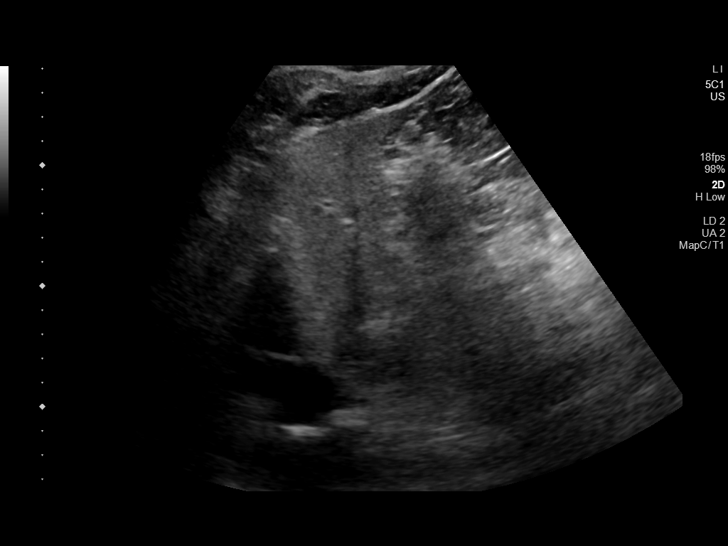
[im 39/86]
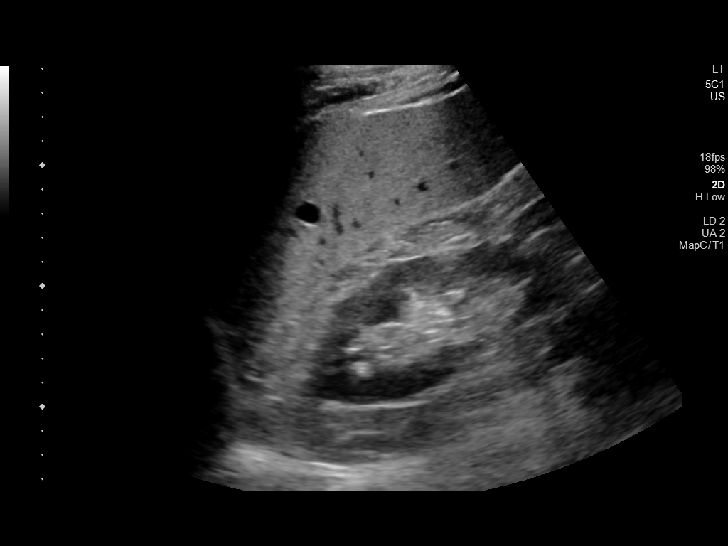
[im 47/86]
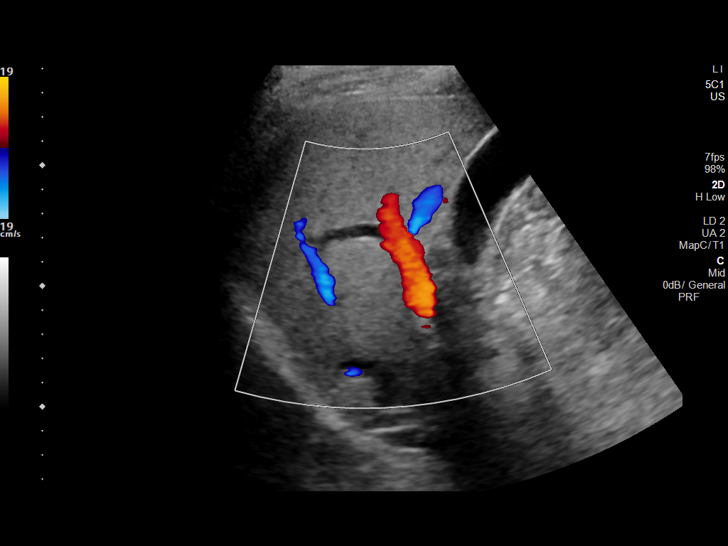
[im 54/86]
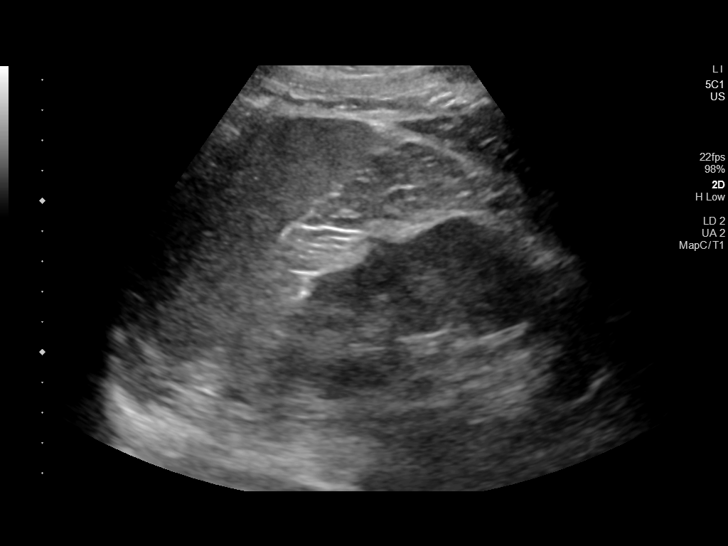
[im 57/86]
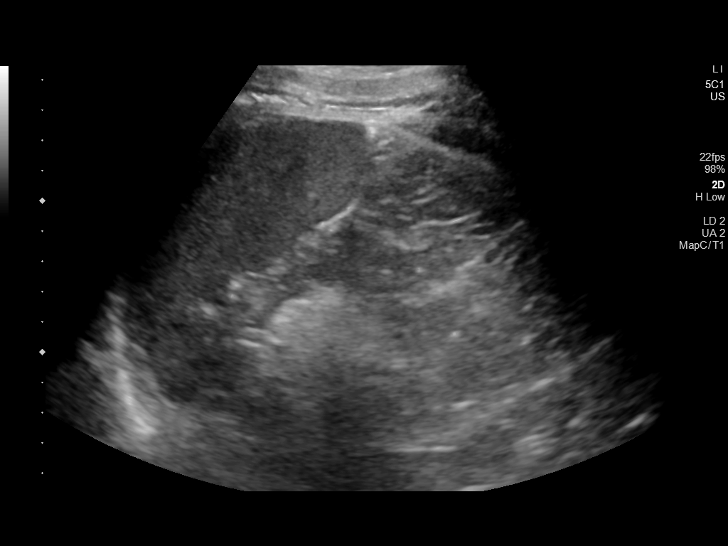
[im 64/86]
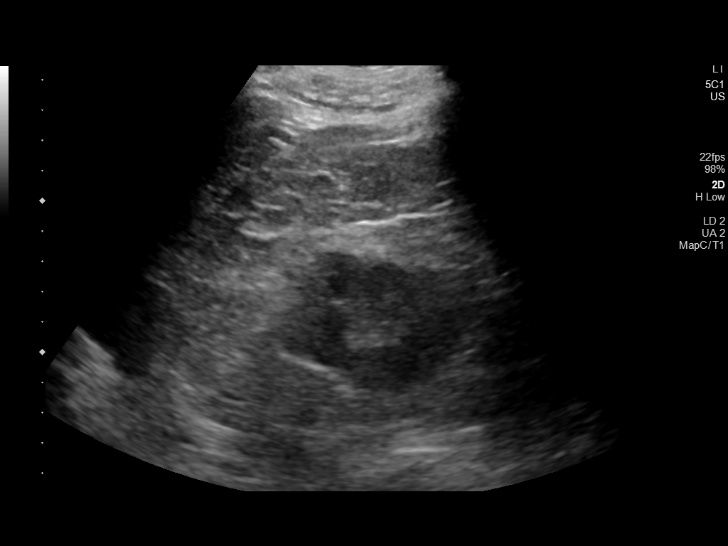
[im 71/86]
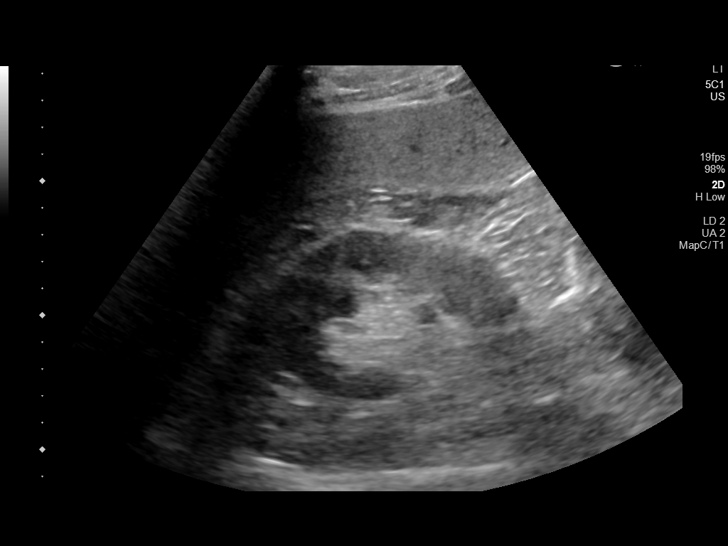
[im 78/86]
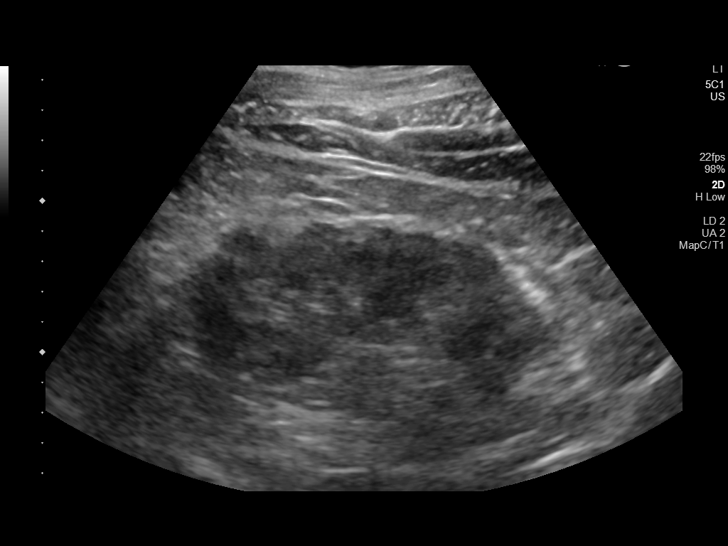
[im 86/86]
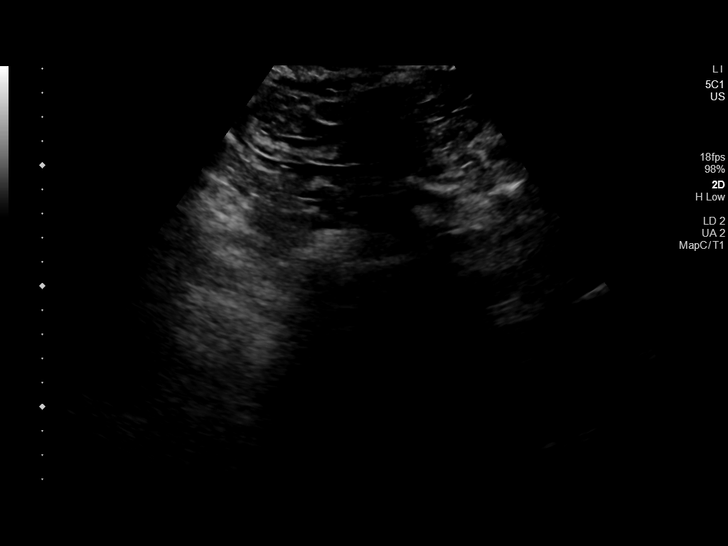

[14 of 25 positions shown; findings below may reference images not displayed]

FINDINGS: Gallbladder: No gallstones or wall thickening visualized. No
sonographic Murphy sign noted by sonographer.

Common bile duct: Diameter: 0.3 cm, within normal limits

Liver: There is a small cyst in the right hepatic lobe measuring
cm. No additional focal lesion identified. Liver parenchymal
echogenicity is diffusely increased. Portal vein is patent on color
Doppler imaging with normal direction of blood flow towards the
liver.

IVC: No abnormality visualized.

Pancreas: Poorly visualized due to shadowing bowel gas.

Spleen: Size and appearance within normal limits.

Right Kidney: Length: 12.9 cm. Echogenicity within normal limits. No
mass or hydronephrosis visualized.

Left Kidney: Length: 12.1 cm. Echogenicity within normal limits. No
mass or hydronephrosis visualized.

Abdominal aorta: No aneurysm visualized.

Other findings: None.
IMPRESSION: Mild diffuse increase in liver parenchymal echogenicity which is a
nonspecific finding but may be secondary to known hemochromatosis.
No suspicious lesion.

## 2021-02-28 ENCOUNTER — Telehealth: Payer: Self-pay | Admitting: Nurse Practitioner

## 2021-02-28 NOTE — Telephone Encounter (Signed)
Rescheduled per provider template change, pt has been called and confirmed appt

## 2021-03-03 NOTE — Progress Notes (Signed)
Dylan Hudson     HPI: FU coronary artery disease. In 2007, the patient had cardiac catheterization that showed an ejection fraction of 45% with severe anterior apical hypokinesis and severe coronary disease. He ultimately underwent coronary artery bypassing graft on June 04, 2005. At that time, he had a saphenous vein graft to the PDA, status vein graft to the diagonal, a RIMA to the OM1, and a LIMA to the LAD. Myoview in March of 2013 showed an ejection fraction of 68%. There was prior infarct but no ischemia. Also with h/o hemchromatosis. Echo 10/21 showed normal LV function, moderate RAE, and mildly dilated ascending aorta. Since I last saw him, he has dyspnea with more vigorous activities but not routine activities.  No orthopnea, PND, pedal edema, chest pain or syncope  Current Outpatient Medications  Medication Sig Dispense Refill   REPATHA SURECLICK 518 MG/ML SOAJ INJECT 140MG  INTO THE SKIN EVERY 14 DAYS 2 mL 11   aspirin 81 MG tablet Take 1 tablet (81 mg total) by mouth daily.     fish oil-omega-3 fatty acids 1000 MG capsule Take 2 g by mouth 2 (two) times daily.     fluticasone (FLONASE) 50 MCG/ACT nasal spray USE 2 SPRAYS INTO THE NOSE DAILY 16 g 1   lisinopril (ZESTRIL) 20 MG tablet TAKE ONE TABLET BY MOUTH DAILY 90 tablet 3   loratadine (CLARITIN) 10 MG tablet Take 10 mg by mouth daily as needed. For allergies     metoprolol succinate (TOPROL-XL) 25 MG 24 hr tablet TAKE ONE TABLET BY MOUTH DAILY 90 tablet 3   pantoprazole (PROTONIX) 40 MG tablet Take 1 tablet (40 mg total) by mouth daily before breakfast. 90 tablet 3   rosuvastatin (CRESTOR) 5 MG tablet TAKE ONE TABLET BY MOUTH ONE TO THREE TIMES PER WEEK AS TOLERATED 90 tablet 2   No current facility-administered medications for this visit.     Past Medical History:  Diagnosis Date   Allergy    seasonal   Amenia    postoperative   Anemia    Benign positional vertigo    CAD (coronary artery disease)    s/p CABG 01/07    Diverticula of colon    Esophageal stricture 08/03/2013   GERD (gastroesophageal reflux disease)    Gilbert's syndrome    possible. (elevated bilirubin)   Hemochromatosis    Hx of adenomatous colonic polyps    Medoff   Hyperlipidemia    Hypertension    Internal hemorrhoid    Memory loss    Myocardial infarction ([place]) 05/2005   Tobacco abuse    Vertigo     Past Surgical History:  Procedure Laterality Date   APPENDECTOMY  2014   lap appy   COLONOSCOPY  2003, 2007, 02/2011   small adenomas   CORONARY ARTERY BYPASS GRAFT     ESOPHAGEAL DILATION  11/19/13   LAPAROSCOPIC APPENDECTOMY N/A 08/06/2012   Procedure: APPENDECTOMY LAPAROSCOPIC;  Surgeon: Imogene Burn. Tsuei, MD;  Location: [place];  Service: General;  Laterality: N/A;   left knee arthroscopy     MEDIAN STERNOTOMY     for CABG x 4 (left internal mammary artery to distal left anterior descending coronary artery, right internal mammary artery to first circumflex marginal branch, saphenous vein graft to posterior descending coronary artery, saphenous vein graft to diagonal branch, endoscopic saphenous vein harvest from right thigh.) SURGEON: Valentina Gu. Roxy Manns, M.D. CHO/MEDQ D:06/08/05. T: 06/09/2005 Job: [id number]   THERAPEUTIC PHLEBOTOMY  03/21/2015  Social History   Socioeconomic History   Marital status: Divorced    Spouse name: Not on file   Number of children: 1   Years of education: 12   Highest education level: Not on file  Occupational History    Comment: Camera operator   Occupation: SERVICE MANAGER    Employer: [employer]  Tobacco Use   Smoking status: Former    Packs/day: 2.00    Years: 36.00    Pack years: 72.00    Types: Cigarettes    Quit date: 03/07/2006    Years since quitting: 15.0   Smokeless tobacco: Never   Tobacco comments:    smoked since age 6 approx. 1 pact to 1 and a half packs a day. fortunately after his bypass surgery he completely discontinued smoking  Vaping Use    Vaping Use: Never used  Substance and Sexual Activity   Alcohol use: Yes    Alcohol/week: 4.0 standard drinks    Types: 4 Cans of beer per week    Comment: socially   Drug use: No   Sexual activity: Not on file  Other Topics Concern   Not on file  Social History Narrative   Lives with girlfriend. Fishing is a hobby.    Fun: Go to the lake every weekend.    Social Determinants of Health   Financial Resource Strain: Not on file  Food Insecurity: Not on file  Transportation Needs: Not on file  Physical Activity: Not on file  Stress: Not on file  Social Connections: Not on file  Intimate Partner Violence: Not on file    Family History  Problem Relation Age of Onset   Colon polyps Mother    Colon cancer Paternal Uncle    Heart attack Maternal Grandfather    Breast cancer Neg Hx    Prostate cancer Neg Hx    Esophageal cancer Neg Hx    Stomach cancer Neg Hx    Rectal cancer Neg Hx     ROS: no fevers or chills, productive cough, hemoptysis, dysphasia, odynophagia, melena, hematochezia, dysuria, hematuria, rash, seizure activity, orthopnea, PND, pedal edema, claudication. Remaining systems are negative.  Physical Exam: Well-developed well-nourished in no acute distress.  Skin is warm and dry.  HEENT is normal.  Neck is supple.  Chest is clear to auscultation with normal expansion.  Cardiovascular exam is regular rate and rhythm.  Abdominal exam nontender or distended. No masses palpated. Extremities show no edema. neuro grossly intact  ECG-normal sinus rhythm at a rate of 93, normal axis, paced, cannot, no ST changes.  Personally reviewed  A/P  1 coronary artery disease-plan to continue aspirin and statin.   2 hypertension-patient's blood pressure is controlled today. Continue present meds and follow.  Check potassium and renal function.   3 hyperlipidemia-continue crestor (patient did not tolerate higher doses in the past) and repatha.  Check lipids and liver.   4  history of hemochromatosis-LV function normal on last echo. Followed by hematology.  Kirk Ruths, MD

## 2021-03-10 ENCOUNTER — Encounter: Payer: Self-pay | Admitting: Cardiology

## 2021-03-10 ENCOUNTER — Other Ambulatory Visit: Payer: Self-pay

## 2021-03-10 ENCOUNTER — Other Ambulatory Visit: Payer: 59

## 2021-03-10 ENCOUNTER — Ambulatory Visit: Payer: 59 | Admitting: Hematology

## 2021-03-10 ENCOUNTER — Ambulatory Visit (INDEPENDENT_AMBULATORY_CARE_PROVIDER_SITE_OTHER): Payer: Self-pay | Admitting: Cardiology

## 2021-03-10 VITALS — BP 122/76 | HR 93 | Ht 70.5 in | Wt 236.0 lb

## 2021-03-10 DIAGNOSIS — I10 Essential (primary) hypertension: Secondary | ICD-10-CM

## 2021-03-10 DIAGNOSIS — E785 Hyperlipidemia, unspecified: Secondary | ICD-10-CM

## 2021-03-10 DIAGNOSIS — I2510 Atherosclerotic heart disease of native coronary artery without angina pectoris: Secondary | ICD-10-CM

## 2021-03-10 NOTE — Patient Instructions (Signed)
Medication Instructions:  Your Physician recommend you continue on your current medication as directed.    *If you need a refill on your cardiac medications before your next appointment, please call your pharmacy*   Lab Work: Liver enzymes, lipids, bmet (fasting) If you have labs (blood work) drawn today and your tests are completely normal, you will receive your results only by: [place] (if you have MyChart) OR A paper copy in the mail If you have any lab test that is abnormal or we need to change your treatment, we will call you to review the results.   Testing/Procedures: None ordered   Follow-Up: At [facility], you and your health needs are our priority.  As part of our continuing mission to provide you with exceptional heart care, we have created designated Provider Care Teams.  These Care Teams include your primary Cardiologist (physician) and Advanced Practice Providers (APPs -  Physician Assistants and Nurse Practitioners) who all work together to provide you with the care you need, when you need it.  We recommend signing up for the patient portal called "MyChart".  Sign up information is provided on this After Visit Summary.  MyChart is used to connect with patients for Virtual Visits (Telemedicine).  Patients are able to view lab/test results, encounter notes, upcoming appointments, etc.  Non-urgent messages can be sent to your provider as well.   To learn more about what you can do with MyChart, go to [email or web address].    Your next appointment:   12 months  The format for your next appointment:   In Person  Provider:   Kirk Ruths, MD

## 2021-03-13 ENCOUNTER — Inpatient Hospital Stay: Payer: Self-pay

## 2021-03-13 ENCOUNTER — Inpatient Hospital Stay (HOSPITAL_BASED_OUTPATIENT_CLINIC_OR_DEPARTMENT_OTHER): Payer: Self-pay | Admitting: Nurse Practitioner

## 2021-03-13 ENCOUNTER — Other Ambulatory Visit: Payer: Self-pay

## 2021-03-13 ENCOUNTER — Encounter: Payer: Self-pay | Admitting: Nurse Practitioner

## 2021-03-13 ENCOUNTER — Inpatient Hospital Stay: Payer: Self-pay | Attending: Hematology

## 2021-03-13 ENCOUNTER — Ambulatory Visit: Payer: Self-pay | Admitting: Nurse Practitioner

## 2021-03-13 DIAGNOSIS — Z23 Encounter for immunization: Secondary | ICD-10-CM

## 2021-03-13 LAB — CBC WITH DIFFERENTIAL (CANCER CENTER ONLY)
Abs Immature Granulocytes: 0.01 10*3/uL (ref 0.00–0.07)
Basophils Absolute: 0.1 10*3/uL (ref 0.0–0.1)
Basophils Relative: 1 %
Eosinophils Absolute: 0.2 10*3/uL (ref 0.0–0.5)
Eosinophils Relative: 3 %
HCT: 45.6 % (ref 39.0–52.0)
Hemoglobin: 16 g/dL (ref 13.0–17.0)
Immature Granulocytes: 0 %
Lymphocytes Relative: 28 %
Lymphs Abs: 1.7 10*3/uL (ref 0.7–4.0)
MCH: 32.1 pg (ref 26.0–34.0)
MCHC: 35.1 g/dL (ref 30.0–36.0)
MCV: 91.6 fL (ref 80.0–100.0)
Monocytes Absolute: 0.8 10*3/uL (ref 0.1–1.0)
Monocytes Relative: 14 %
Neutro Abs: 3.3 10*3/uL (ref 1.7–7.7)
Neutrophils Relative %: 54 %
Platelet Count: 166 10*3/uL (ref 150–400)
RBC: 4.98 MIL/uL (ref 4.22–5.81)
RDW: 13.2 % (ref 11.5–15.5)
WBC Count: 6.1 10*3/uL (ref 4.0–10.5)
nRBC: 0 % (ref 0.0–0.2)

## 2021-03-13 LAB — IRON AND TIBC
Iron: 155 ug/dL (ref 45–182)
Saturation Ratios: 34 % (ref 17.9–39.5)
TIBC: 455 ug/dL — ABNORMAL HIGH (ref 250–450)
UIBC: 300 ug/dL

## 2021-03-13 LAB — FERRITIN: Ferritin: 25 ng/mL (ref 24–336)

## 2021-03-13 MED ORDER — INFLUENZA VAC SPLIT QUAD 0.5 ML IM SUSY
0.5000 mL | PREFILLED_SYRINGE | Freq: Once | INTRAMUSCULAR | Status: AC
  Administered 2021-03-13: 0.5 mL via INTRAMUSCULAR
  Filled 2021-03-13: qty 0.5

## 2021-03-13 NOTE — Progress Notes (Addendum)
[place]   [phone] [phone]   Clinic Follow up Note   Patient Care Team: Marrian Salvage, FNP as PCP - General (Internal Medicine) Stanford Breed Denice Bors, MD as PCP - Cardiology (Cardiology) Gatha Mayer, MD as Consulting Physician (Gastroenterology) Lelon Perla, MD as Consulting Physician (Cardiology) 03/13/2021  CHIEF COMPLAINT: Follow up hemochromatosis   CURRENT THERAPY: Phlebotomy PRN if ferritin >50 or transferrin saturation >50%  INTERVAL HISTORY: Mr. Ginsberg returns for follow up as scheduled. Last seen by Dr. Burr Medico 03/10/20. Labs checked q4 months, he did not require phlebotomy in February or June.  He is feeling well overall, denies changes in his health since last year.  Saw cardiologist recently, and needs to schedule annual checkup with PCP.  He got the first 3 COVID injections, subsequently developed a right lower leg rash each time so he does not continue getting boosters.  Energy and appetite are normal, bowels moving well, denies bleeding.  Denies dyspnea, cough, chest pain, leg edema, recent infection, pain, or any other specific complaints.   MEDICAL HISTORY:  Past Medical History:  Diagnosis Date   Allergy    seasonal   Amenia    postoperative   Anemia    Benign positional vertigo    CAD (coronary artery disease)    s/p CABG 01/07   Diverticula of colon    Esophageal stricture 08/03/2013   GERD (gastroesophageal reflux disease)    Gilbert's syndrome    possible. (elevated bilirubin)   Hemochromatosis    Hx of adenomatous colonic polyps    Medoff   Hyperlipidemia    Hypertension    Internal hemorrhoid    Memory loss    Myocardial infarction ([place]) 05/2005   Tobacco abuse    Vertigo     SURGICAL HISTORY: Past Surgical History:  Procedure Laterality Date   APPENDECTOMY  2014   lap appy   COLONOSCOPY  2003, 2007, 02/2011   small adenomas   CORONARY ARTERY BYPASS GRAFT     ESOPHAGEAL DILATION   11/19/13   LAPAROSCOPIC APPENDECTOMY N/A 08/06/2012   Procedure: APPENDECTOMY LAPAROSCOPIC;  Surgeon: Imogene Burn. Tsuei, MD;  Location: [place];  Service: General;  Laterality: N/A;   left knee arthroscopy     MEDIAN STERNOTOMY     for CABG x 4 (left internal mammary artery to distal left anterior descending coronary artery, right internal mammary artery to first circumflex marginal branch, saphenous vein graft to posterior descending coronary artery, saphenous vein graft to diagonal branch, endoscopic saphenous vein harvest from right thigh.) SURGEON: Valentina Gu. Roxy Manns, M.D. CHO/MEDQ D:06/08/05. T: 06/09/2005 Job: [id number]   THERAPEUTIC PHLEBOTOMY  03/21/2015        I have reviewed the social history and family history with the patient and they are unchanged from previous note.  ALLERGIES:  is allergic to niacin and related, pravachol [pravastatin], red yeast rice [cholestin], and zocor [simvastatin].  MEDICATIONS:  Current Outpatient Medications  Medication Sig Dispense Refill   REPATHA SURECLICK 701 MG/ML SOAJ INJECT 140MG  INTO THE SKIN EVERY 14 DAYS 2 mL 11   aspirin 81 MG tablet Take 1 tablet (81 mg total) by mouth daily.     fish oil-omega-3 fatty acids 1000 MG capsule Take 2 g by mouth 2 (two) times daily.     fluticasone (FLONASE) 50 MCG/ACT nasal spray USE 2 SPRAYS INTO THE NOSE DAILY 16 g 1   lisinopril (ZESTRIL) 20 MG tablet TAKE ONE TABLET BY MOUTH DAILY 90 tablet  3   loratadine (CLARITIN) 10 MG tablet Take 10 mg by mouth daily as needed. For allergies     metoprolol succinate (TOPROL-XL) 25 MG 24 hr tablet TAKE ONE TABLET BY MOUTH DAILY 90 tablet 3   pantoprazole (PROTONIX) 40 MG tablet Take 1 tablet (40 mg total) by mouth daily before breakfast. 90 tablet 3   rosuvastatin (CRESTOR) 5 MG tablet TAKE ONE TABLET BY MOUTH ONE TO THREE TIMES PER WEEK AS TOLERATED 90 tablet 2   No current facility-administered medications for this visit.    PHYSICAL EXAMINATION: ECOG PERFORMANCE STATUS:  0 - Asymptomatic  Vitals:   03/13/21 0845  BP: 134/84  Pulse: 66  Resp: 18  Temp: 97.6 F (36.4 C)  SpO2: 100%   Filed Weights   03/13/21 0845  Weight: 237 lb 1 oz (107.5 kg)    GENERAL:alert, no distress and comfortable SKIN: No rash EYES: sclera clear NECK: Without mass LYMPH:  no palpable cervical or supraclavicular lymphadenopathy LUNGS: clear with normal breathing effort HEART: regular rate & rhythm, no lower extremity edema NEURO: alert & oriented x 3 with fluent speech, no focal motor/sensory deficits  LABORATORY DATA:  I have reviewed the data as listed CBC Latest Ref Rng & Units 03/13/2021 11/07/2020 07/11/2020  WBC 4.0 - 10.5 K/uL 6.1 5.6 6.0  Hemoglobin 13.0 - 17.0 g/dL 16.0 15.5 15.7  Hematocrit 39.0 - 52.0 % 45.6 44.6 45.0  Platelets 150 - 400 K/uL 166 163 184     CMP Latest Ref Rng & Units 02/22/2020 07/15/2018 10/28/2017  Glucose 65 - 99 mg/dL 94 - 107(H)  BUN 8 - 27 mg/dL 14 - 12  Creatinine 0.76 - 1.27 mg/dL 1.03 - 1.02  Sodium 134 - 144 mmol/L 139 - 138  Potassium 3.5 - 5.2 mmol/L 4.4 - 4.0  Chloride 96 - 106 mmol/L 102 - 101  CO2 20 - 29 mmol/L 25 - 28  Calcium 8.6 - 10.2 mg/dL 9.6 - 9.5  Total Protein 6.0 - 8.5 g/dL 6.9 6.5 6.8  Total Bilirubin 0.0 - 1.2 mg/dL 1.7(H) 1.7(H) 2.0(H)  Alkaline Phos 44 - 121 IU/L 54 56 47  AST 0 - 40 IU/L 36 28 26  ALT 0 - 44 IU/L 62(H) 47(H) 39      RADIOGRAPHIC STUDIES: I have personally reviewed the radiological images as listed and agreed with the findings in the report. No results found.   ASSESSMENT & PLAN: RITHVIK ORCUTT is a 60 y.o. male with      1. Hemochromatosis, HFE H63D homozygous -He underwent a liver biopsy in 2011, which showed 3-4+ hemosiderin deposition, confirmed hemochromatosis. -His HFE gene mutation test showed H63D/H63D which is not the most common but can cause hemochromatosis.  He was previously counseled that this is an inheritable disease and it was recommended for his children to  get genetic testing. -He is on phlebotomy program to keep ferritin < 50 and transferrin saturation < 50%, has not required a phlebotomy in over a year -Continue observation  2. Cancer screenings -his last EGD was in 2015 and colonoscopy in 03/2018 by Dr. Arelia Longest, recall 2024 -Per patient he is up-to-date on PSA checks. -Given his risk for liver cirrhosis and HCC, he undergoes periodic abdominal ultrasounds, last done 03/24/2020 showed mildly increased echogenicity without focal lesions.  Repeat 03/2022    Disposition: Mr. Remer is clinically doing well.  He has not required phlebotomy and over a year.  Today's CBC is normal, ferritin 25, transferrin saturation is  24%, he does not need phlebotomy.     I encouraged him to continue healthy active lifestyle, avoid excessive iron, and remain up-to-date on age-appropriate health maintenance.  He will receive the flu vaccine today.  I have asked him to have lab checked at PCP in 4 months with well check, then we will repeat lab in 8 and 12 months and arrange phlebotomy if ferritin > 50 or transferrin saturation > 50%. Follow-up in 1 year.  All questions were answered. The patient knows to call the clinic with any problems, questions or concerns. No barriers to learning were detected.     Alla Feeling, NP 03/13/21

## 2021-03-13 NOTE — Patient Instructions (Signed)
Influenza (Flu) Vaccine (Inactivated or Recombinant): What You Need to Know 1. Why get vaccinated? Influenza vaccine can prevent influenza (flu). Flu is a contagious disease that spreads around the [place] every year, usually between October and May. Anyone can get the flu, but it is more dangerous for some people. Infants and young children, people 65 years and older, pregnant people, and people with certain health conditions or a weakened immune system are at greatest risk of flu complications. Pneumonia, bronchitis, sinus infections, and ear infections are examples of flu-related complications. If you have a medical condition, such as heart disease, cancer, or diabetes, flu can make it worse. Flu can cause fever and chills, sore throat, muscle aches, fatigue, cough, headache, and runny or stuffy nose. Some people may have vomiting and diarrhea, though this is more common in children than adults. In an average year, thousands of people in the [place] die from flu, and many more are hospitalized. Flu vaccine prevents millions of illnesses and flu-related visits to the doctor each year. 2. Influenza vaccines CDC recommends everyone 6 months and older get vaccinated every flu season. Children 6 months through 8 years of age may need 2 doses during a single flu season. Everyone else needs only 1 dose each flu season. It takes about 2 weeks for protection to develop after vaccination. There are many flu viruses, and they are always changing. Each year a new flu vaccine is made to protect against the influenza viruses believed to be likely to cause disease in the upcoming flu season. Even when the vaccine doesn't exactly match these viruses, it may still provide some protection. Influenza vaccine does not cause flu. Influenza vaccine may be given at the same time as other vaccines. 3. Talk with your health care provider Tell your vaccination provider if the person getting the vaccine: Has had  an allergic reaction after a previous dose of influenza vaccine, or has any severe, life-threatening allergies Has ever had Guillain-Barr Syndrome (also called "GBS") In some cases, your health care provider may decide to postpone influenza vaccination until a future visit. Influenza vaccine can be administered at any time during pregnancy. People who are or will be pregnant during influenza season should receive inactivated influenza vaccine. People with minor illnesses, such as a cold, may be vaccinated. People who are moderately or severely ill should usually wait until they recover before getting influenza vaccine. Your health care provider can give you more information. 4. Risks of a vaccine reaction Soreness, redness, and swelling where the shot is given, fever, muscle aches, and headache can happen after influenza vaccination. There may be a very small increased risk of Guillain-Barr Syndrome (GBS) after inactivated influenza vaccine (the flu shot). Young children who get the flu shot along with pneumococcal vaccine (PCV13) and/or DTaP vaccine at the same time might be slightly more likely to have a seizure caused by fever. Tell your health care provider if a child who is getting flu vaccine has ever had a seizure. People sometimes faint after medical procedures, including vaccination. Tell your provider if you feel dizzy or have vision changes or ringing in the ears. As with any medicine, there is a very remote chance of a vaccine causing a severe allergic reaction, other serious injury, or death. 5. What if there is a serious problem? An allergic reaction could occur after the vaccinated person leaves the clinic. If you see signs of a severe allergic reaction (hives, swelling of the face and throat, difficulty breathing,   a fast heartbeat, dizziness, or weakness), call 9-1-1 and get the person to the nearest hospital. For other signs that concern you, call your health care provider. Adverse  reactions should be reported to the Vaccine Adverse Event Reporting System (VAERS). Your health care provider will usually file this report, or you can do it yourself. Visit the VAERS website at www.vaers.hhs.gov or call 1-800-822-7967. VAERS is only for reporting reactions, and VAERS staff members do not give medical advice. 6. The National Vaccine Injury Compensation Program The National Vaccine Injury Compensation Program (VICP) is a federal program that was created to compensate people who may have been injured by certain vaccines. Claims regarding alleged injury or death due to vaccination have a time limit for filing, which may be as short as two years. Visit the VICP website at www.hrsa.gov/vaccinecompensation or call 1-800-338-2382 to learn about the program and about filing a claim. 7. How can I learn more? Ask your health care provider. Call your local or state health department. Visit the website of the Food and Drug Administration (FDA) for vaccine package inserts and additional information at www.fda.gov/vaccines-blood-biologics/vaccines. Contact the Centers for Disease Control and Prevention (CDC): Call 1-800-232-4636 (1-800-CDC-INFO) or Visit CDC's website at www.cdc.gov/flu. Vaccine Information Statement Inactivated Influenza Vaccine (12/25/2019) This information is not intended to replace advice given to you by your health care provider. Make sure you discuss any questions you have with your health care provider. Document Revised: 02/11/2020 Document Reviewed: 02/11/2020 Elsevier Patient Education  2022 Elsevier Inc.  

## 2021-03-14 ENCOUNTER — Telehealth: Payer: Self-pay | Admitting: Nurse Practitioner

## 2021-03-14 NOTE — Telephone Encounter (Signed)
Scheduled per 10/24 los, pt has been called and confirmed appt  

## 2021-04-24 ENCOUNTER — Encounter: Payer: Self-pay | Admitting: *Deleted

## 2021-04-26 LAB — HEPATIC FUNCTION PANEL
ALT: 53 IU/L — ABNORMAL HIGH (ref 0–44)
AST: 33 IU/L (ref 0–40)
Albumin: 4.4 g/dL (ref 3.8–4.8)
Alkaline Phosphatase: 50 IU/L (ref 44–121)
Bilirubin Total: 1.6 mg/dL — ABNORMAL HIGH (ref 0.0–1.2)
Bilirubin, Direct: 0.73 mg/dL — ABNORMAL HIGH (ref 0.00–0.40)
Total Protein: 6.6 g/dL (ref 6.0–8.5)

## 2021-04-26 LAB — BASIC METABOLIC PANEL
BUN/Creatinine Ratio: 15 (ref 10–24)
BUN: 15 mg/dL (ref 8–27)
CO2: 24 mmol/L (ref 20–29)
Calcium: 9.5 mg/dL (ref 8.6–10.2)
Chloride: 101 mmol/L (ref 96–106)
Creatinine, Ser: 0.98 mg/dL (ref 0.76–1.27)
Glucose: 95 mg/dL (ref 70–99)
Potassium: 4.1 mmol/L (ref 3.5–5.2)
Sodium: 138 mmol/L (ref 134–144)
eGFR: 87 mL/min/{1.73_m2} (ref >59)

## 2021-04-26 LAB — LIPID PANEL
Chol/HDL Ratio: 4.5 ratio (ref 0.0–5.0)
Cholesterol, Total: 163 mg/dL (ref 100–199)
HDL: 36 mg/dL — ABNORMAL LOW (ref >39)
LDL Chol Calc (NIH): 97 mg/dL (ref 0–99)
Triglycerides: 172 mg/dL — ABNORMAL HIGH (ref 0–149)
VLDL Cholesterol Cal: 30 mg/dL (ref 5–40)

## 2021-05-11 ENCOUNTER — Telehealth: Payer: Self-pay | Admitting: *Deleted

## 2021-05-11 NOTE — Telephone Encounter (Signed)
-----   Message from Lelon Perla, MD sent at 04/27/2021  7:11 AM EST ----- Add zetia 10 mg daily; lipids and liver 8 weeks Kirk Ruths

## 2021-05-11 NOTE — Telephone Encounter (Signed)
Patient did not respond to my chart message, he was on zetia in the past and it was stopped. Wanting to make sure he did not have a problem taking that medication in the past. Left message for pt to call

## 2021-05-11 NOTE — Telephone Encounter (Signed)
Spoke with pt, he reports he had joint pain from the zetia. Will place on allergy list as an intolerance.

## 2021-06-23 ENCOUNTER — Encounter: Payer: Self-pay | Admitting: Family

## 2021-06-23 ENCOUNTER — Ambulatory Visit: Payer: Commercial Managed Care - HMO | Admitting: Family

## 2021-06-23 VITALS — BP 104/70 | HR 55 | Temp 97.7°F | Ht 70.0 in | Wt 235.4 lb

## 2021-06-23 DIAGNOSIS — E538 Deficiency of other specified B group vitamins: Secondary | ICD-10-CM | POA: Diagnosis not present

## 2021-06-23 DIAGNOSIS — Z0000 Encounter for general adult medical examination without abnormal findings: Secondary | ICD-10-CM

## 2021-06-23 DIAGNOSIS — Z125 Encounter for screening for malignant neoplasm of prostate: Secondary | ICD-10-CM | POA: Diagnosis not present

## 2021-06-23 DIAGNOSIS — G478 Other sleep disorders: Secondary | ICD-10-CM

## 2021-06-23 DIAGNOSIS — R0683 Snoring: Secondary | ICD-10-CM

## 2021-06-23 LAB — PSA: PSA: 0.28 ng/mL (ref 0.10–4.00)

## 2021-06-23 LAB — CBC WITH DIFFERENTIAL/PLATELET
Basophils Absolute: 0 10*3/uL (ref 0.0–0.1)
Basophils Relative: 0.8 % (ref 0.0–3.0)
Eosinophils Absolute: 0.1 10*3/uL (ref 0.0–0.7)
Eosinophils Relative: 2.4 % (ref 0.0–5.0)
HCT: 47.7 % (ref 39.0–52.0)
Hemoglobin: 16.1 g/dL (ref 13.0–17.0)
Lymphocytes Relative: 33.7 % (ref 12.0–46.0)
Lymphs Abs: 1.8 10*3/uL (ref 0.7–4.0)
MCHC: 33.8 g/dL (ref 30.0–36.0)
MCV: 94.8 fl (ref 78.0–100.0)
Monocytes Absolute: 0.7 10*3/uL (ref 0.1–1.0)
Monocytes Relative: 13.5 % — ABNORMAL HIGH (ref 3.0–12.0)
Neutro Abs: 2.7 10*3/uL (ref 1.4–7.7)
Neutrophils Relative %: 49.6 % (ref 43.0–77.0)
Platelets: 162 10*3/uL (ref 150.0–400.0)
RBC: 5.03 Mil/uL (ref 4.22–5.81)
RDW: 13.4 % (ref 11.5–15.5)
WBC: 5.4 10*3/uL (ref 4.0–10.5)

## 2021-06-23 LAB — IRON,TIBC AND FERRITIN PANEL
%SAT: 30 % (calc) (ref 20–48)
Ferritin: 30 ng/mL (ref 24–380)
Iron: 135 ug/dL (ref 50–180)
TIBC: 450 mcg/dL (calc) — ABNORMAL HIGH (ref 250–425)

## 2021-06-23 LAB — VITAMIN B12: Vitamin B-12: 220 pg/mL (ref 211–911)

## 2021-06-23 NOTE — Patient Instructions (Signed)

## 2021-06-23 NOTE — Progress Notes (Signed)
Dylan Hudson is a 61 y.o. male with the following history as recorded in EpicCare:  Patient Active Problem List   Diagnosis Date Noted   Internal and external prolapsed hemorrhoids 05/01/2018   History of nicotine dependence 02/14/2016   Routine general medical examination at a health care facility 08/14/2013   Hyperglycemia 08/13/2012   S/P appendectomy 08/06/2012   Rhinitis 07/28/2011   LIBIDO, DECREASED 08/02/2010   Hereditary hemochromatosis ([place]) 10/05/2009   GILBERT'S SYNDROME 10/05/2009   MEMORY LOSS 11/14/2007   Coronary atherosclerosis 07/14/2007   ADENOMATOUS COLONIC POLYPS, HX OF 07/14/2007   Hyperlipidemia 03/28/2007   Essential hypertension 03/28/2007    Current Outpatient Medications  Medication Sig Dispense Refill   aspirin 81 MG tablet Take 1 tablet (81 mg total) by mouth daily.     fish oil-omega-3 fatty acids 1000 MG capsule Take 2 g by mouth 2 (two) times daily.     fluticasone (FLONASE) 50 MCG/ACT nasal spray USE 2 SPRAYS INTO THE NOSE DAILY 16 g 1   lisinopril (ZESTRIL) 20 MG tablet TAKE ONE TABLET BY MOUTH DAILY 90 tablet 3   loratadine (CLARITIN) 10 MG tablet Take 10 mg by mouth daily as needed. For allergies     metoprolol succinate (TOPROL-XL) 25 MG 24 hr tablet TAKE ONE TABLET BY MOUTH DAILY 90 tablet 3   pantoprazole (PROTONIX) 40 MG tablet Take 1 tablet (40 mg total) by mouth daily before breakfast. 90 tablet 3   REPATHA SURECLICK 149 MG/ML SOAJ INJECT 140MG  INTO THE SKIN EVERY 14 DAYS 2 mL 11   rosuvastatin (CRESTOR) 5 MG tablet TAKE ONE TABLET BY MOUTH ONE TO THREE TIMES PER WEEK AS TOLERATED 90 tablet 2   No current facility-administered medications for this visit.    Allergies: Niacin and related, Pravachol [pravastatin], Red yeast rice [cholestin], Zetia [ezetimibe], and Zocor [simvastatin]  Past Medical History:  Diagnosis Date   Allergy    seasonal   Amenia    postoperative   Anemia    Benign positional vertigo    CAD (coronary artery  disease)    s/p CABG 01/07   Diverticula of colon    Esophageal stricture 08/03/2013   GERD (gastroesophageal reflux disease)    Gilbert's syndrome    possible. (elevated bilirubin)   Hemochromatosis    Hx of adenomatous colonic polyps    Medoff   Hyperlipidemia    Hypertension    Internal hemorrhoid    Memory loss    Myocardial infarction ([place]) 05/2005   Tobacco abuse    Vertigo     Past Surgical History:  Procedure Laterality Date   APPENDECTOMY  2014   lap appy   COLONOSCOPY  2003, 2007, 02/2011   small adenomas   CORONARY ARTERY BYPASS GRAFT     ESOPHAGEAL DILATION  11/19/13   LAPAROSCOPIC APPENDECTOMY N/A 08/06/2012   Procedure: APPENDECTOMY LAPAROSCOPIC;  Surgeon: Imogene Burn. Tsuei, MD;  Location: [place];  Service: General;  Laterality: N/A;   left knee arthroscopy     MEDIAN STERNOTOMY     for CABG x 4 (left internal mammary artery to distal left anterior descending coronary artery, right internal mammary artery to first circumflex marginal branch, saphenous vein graft to posterior descending coronary artery, saphenous vein graft to diagonal branch, endoscopic saphenous vein harvest from right thigh.) SURGEON: Valentina Gu. Roxy Manns, M.D. CHO/MEDQ D:06/08/05. T: 06/09/2005 Job: [id number]   THERAPEUTIC PHLEBOTOMY  03/21/2015        Family History  Problem Relation Age of  Onset   Colon polyps Mother    Colon cancer Paternal Uncle    Heart attack Maternal Grandfather    Breast cancer Neg Hx    Prostate cancer Neg Hx    Esophageal cancer Neg Hx    Stomach cancer Neg Hx    Rectal cancer Neg Hx     Social History   Tobacco Use   Smoking status: Former    Packs/day: 2.00    Years: 36.00    Pack years: 72.00    Types: Cigarettes    Quit date: 03/07/2006    Years since quitting: 15.3   Smokeless tobacco: Never   Tobacco comments:    smoked since age 8 approx. 1 pact to 1 and a half packs a day. fortunately after his bypass surgery he completely discontinued smoking  Substance  Use Topics   Alcohol use: Yes    Alcohol/week: 4.0 standard drinks    Types: 4 Cans of beer per week    Comment: socially    Subjective:  Presents for yearly CPE; has not been seen by me in almost 3 years but continuing to see his specialists regularly; Oncology asked for specific labs to be drawn today as part of the work up here;  Does feel that sleep has been more problematic- may get up 2-3 x per night to urinate; always wakes up at 3 am- unable to sleep from 3 am-5 am; underlying cardiac issues; thinks had sleep study "years ago."  Review of Systems  Constitutional: Negative.   HENT: Negative.    Eyes: Negative.   Respiratory: Negative.    Cardiovascular: Negative.   Gastrointestinal: Negative.   Genitourinary: Negative.   Musculoskeletal: Negative.   Skin: Negative.   Neurological: Negative.   Endo/Heme/Allergies: Negative.   Psychiatric/Behavioral:  The patient has insomnia.      Objective:  Vitals:   06/23/21 0950  BP: 104/70  Pulse: (!) 55  Temp: 97.7 F (36.5 C)  TempSrc: Oral  SpO2: 98%  Weight: 235 lb 6.4 oz (106.8 kg)  Height: 5\' 10"  (1.778 m)    General: Well developed, well nourished, in no acute distress  Skin : Warm and dry.  Head: Normocephalic and atraumatic  Eyes: Sclera and conjunctiva clear; pupils round and reactive to light; extraocular movements intact  Ears: External normal; canals clear; tympanic membranes normal  Oropharynx: Pink, supple. No suspicious lesions  Neck: Supple without thyromegaly, adenopathy  Lungs: Respirations unlabored; clear to auscultation bilaterally without wheeze, rales, rhonchi  CVS exam: normal rate and regular rhythm.  Abdomen: Soft; nontender; nondistended; normoactive bowel sounds; no masses or hepatosplenomegaly  Musculoskeletal: No deformities; no active joint inflammation  Extremities: No edema, cyanosis, clubbing  Vessels: Symmetric bilaterally  Neurologic: Alert and oriented; speech intact; face  symmetrical; moves all extremities well; CNII-XII intact without focal deficit   Assessment:  1. PE (physical exam), annual   2. Snoring   3. Poor sleep pattern   4. Hereditary hemochromatosis ([place])   5. Prostate cancer screening   6. Low vitamin B12 level     Plan:   Age appropriate preventive healthcare needs addressed; encouraged regular eye doctor and dental exams; encouraged regular exercise; will update labs and refills as needed today; follow-up to be determined; Order for sleep study updated; he will plan to get Shingrix at local pharmacy at later date; Will forward labs to hematology as requested;   This visit occurred during the SARS-CoV-2 public health emergency.  Safety protocols were in place, including  screening questions prior to the visit, additional usage of staff PPE, and extensive cleaning of exam room while observing appropriate contact time as indicated for disinfecting solutions.    No follow-ups on file.  Orders Placed This Encounter  Procedures   CBC with Differential/Platelet   Iron, TIBC and Ferritin Panel   PSA   B12   Ambulatory referral to Neurology    Referral Priority:   Routine    Referral Type:   Consultation    Referral Reason:   Specialty Services Required    Requested Specialty:   Neurology    Number of Visits Requested:   1    Requested Prescriptions    No prescriptions requested or ordered in this encounter

## 2021-07-06 ENCOUNTER — Other Ambulatory Visit: Payer: Self-pay | Admitting: Cardiology

## 2021-08-04 ENCOUNTER — Other Ambulatory Visit: Payer: Self-pay | Admitting: Cardiology

## 2021-08-29 ENCOUNTER — Institutional Professional Consult (permissible substitution): Payer: Self-pay | Admitting: Neurology

## 2021-10-24 ENCOUNTER — Other Ambulatory Visit: Payer: Self-pay | Admitting: Cardiology

## 2021-10-26 ENCOUNTER — Telehealth: Payer: Self-pay | Admitting: Cardiology

## 2021-10-26 ENCOUNTER — Other Ambulatory Visit: Payer: Self-pay

## 2021-10-26 MED ORDER — REPATHA SURECLICK 140 MG/ML ~~LOC~~ SOAJ: SUBCUTANEOUS | 11 refills | Status: DC

## 2021-10-26 NOTE — Telephone Encounter (Signed)
Pt c/o medication issue:  1. Name of Medication: REPATHA SURECLICK 832 MG/ML SOAJ  2. How are you currently taking this medication (dosage and times per day)? As written  3. Are you having a reaction (difficulty breathing--STAT)? No   4. What is your medication issue? Patient needs a new prescription sent in by Dr. Stanford Breed and sent over to  [facility] [id number] - [place], [place]

## 2021-10-30 ENCOUNTER — Other Ambulatory Visit: Payer: Self-pay | Admitting: Internal Medicine

## 2021-11-13 ENCOUNTER — Inpatient Hospital Stay: Payer: Self-pay | Attending: Nurse Practitioner

## 2021-11-13 ENCOUNTER — Other Ambulatory Visit: Payer: Self-pay

## 2021-11-13 LAB — CBC WITH DIFFERENTIAL (CANCER CENTER ONLY)
Abs Immature Granulocytes: 0.01 10*3/uL (ref 0.00–0.07)
Basophils Absolute: 0.1 10*3/uL (ref 0.0–0.1)
Basophils Relative: 1 %
Eosinophils Absolute: 0.2 10*3/uL (ref 0.0–0.5)
Eosinophils Relative: 4 %
HCT: 44.1 % (ref 39.0–52.0)
Hemoglobin: 15.7 g/dL (ref 13.0–17.0)
Immature Granulocytes: 0 %
Lymphocytes Relative: 29 %
Lymphs Abs: 1.4 10*3/uL (ref 0.7–4.0)
MCH: 32.3 pg (ref 26.0–34.0)
MCHC: 35.6 g/dL (ref 30.0–36.0)
MCV: 90.7 fL (ref 80.0–100.0)
Monocytes Absolute: 0.6 10*3/uL (ref 0.1–1.0)
Monocytes Relative: 13 %
Neutro Abs: 2.5 10*3/uL (ref 1.7–7.7)
Neutrophils Relative %: 53 %
Platelet Count: 160 10*3/uL (ref 150–400)
RBC: 4.86 MIL/uL (ref 4.22–5.81)
RDW: 12.3 % (ref 11.5–15.5)
WBC Count: 4.7 10*3/uL (ref 4.0–10.5)
nRBC: 0 % (ref 0.0–0.2)

## 2021-11-13 LAB — IRON AND IRON BINDING CAPACITY (CC-WL,HP ONLY)
Iron: 67 ug/dL (ref 45–182)
Saturation Ratios: 14 % — ABNORMAL LOW (ref 17.9–39.5)
TIBC: 466 ug/dL — ABNORMAL HIGH (ref 250–450)
UIBC: 399 ug/dL — ABNORMAL HIGH (ref 117–376)

## 2021-11-13 LAB — FERRITIN: Ferritin: 20 ng/mL — ABNORMAL LOW (ref 24–336)

## 2022-01-18 ENCOUNTER — Telehealth: Payer: Self-pay | Admitting: Cardiology

## 2022-01-18 NOTE — Telephone Encounter (Signed)
Pt c/o medication issue:  1. Name of Medication: Evolocumab (REPATHA SURECLICK) 427 MG/ML SOAJ  2. How are you currently taking this medication (dosage and times per day)? As prescribed  3. Are you having a reaction (difficulty breathing--STAT)?   4. What is your medication issue? Pt states he suppose to take his next dose of this tomorrow, but insurance is refusing to pay for this. Requesting call back to see what he needs to do.

## 2022-01-19 NOTE — Telephone Encounter (Signed)
His current PA does not expire until 05/20/22. However it looks like this was an issue last year as well because his insurance company has a different DOB.  See telephone encounter from 10/31/20.  He can have a sample but I recommend he contact his [place] [employer] plan to sort out his DOB situation otherwise we can not update prior authorization

## 2022-01-19 NOTE — Telephone Encounter (Signed)
Attempted to call patient- unable to reach or leave message at this time. Will try again at another time and will route back to triage.

## 2022-01-24 NOTE — Telephone Encounter (Signed)
Attempted to call pt again voicemail not set up unable to leave message. Will try later .Adonis Housekeeper

## 2022-01-25 NOTE — Telephone Encounter (Signed)
Spoke to patient. He states he is out of town will be  on Monday. He will pick up samples at that time.  Patient states he been trying to contact insurance x3 without success. RN informed him to keep trying.   Patient is aware  samples will be in refrigerated in the pharmacy area.

## 2022-01-25 NOTE — Telephone Encounter (Signed)
Unable to reach by phone.  Sent message by [employer]    "Hello Mr Kettles Our  office has been trying to contact you by phone for  several  days without success. Your voicemail has not been set up.   Here is the response from our [place] team which manage your medication for Dr Stanford Breed regarding your Repatha  His current PA does not expire until 05/20/22. However it looks like this was an issue last year as well because his insurance company has a different DOB.  See telephone encounter from 10/31/20.  He can have a sample but I recommend he contact his [place] [employer] plan to sort out his DOB situation otherwise we can not update prior authorization C. Pavero RPH  PLEASE CONTACT THE OFFICE BY PHONE OR [place] RN"  Awaiting for response

## 2022-01-30 ENCOUNTER — Other Ambulatory Visit: Payer: Self-pay | Admitting: Cardiology

## 2022-03-10 ENCOUNTER — Other Ambulatory Visit: Payer: Self-pay | Admitting: Nurse Practitioner

## 2022-03-10 NOTE — Progress Notes (Unsigned)
[place]   [phone] [phone]   Clinic Follow up Note   Patient Care Team: Marrian Salvage, FNP as PCP - General (Internal Medicine) Stanford Breed Denice Bors, MD as PCP - Cardiology (Cardiology) Gatha Mayer, MD as Consulting Physician (Gastroenterology) Lelon Perla, MD as Consulting Physician (Cardiology) 03/12/2022  CHIEF COMPLAINT: Follow up hemochromatosis   CURRENT THERAPY: Phlebotomy PRN if Ferritin > 50 or transferrin > 50%  INTERVAL HISTORY: Dylan Hudson returns for follow up as scheduled. Last seen by me 03/13/21. He did not require phlebotomy this year.  He is doing well without changes in his overall health.  He feels tired but attributes to recently selling a home and moving, also has leg cramps from frequent walking.  No leg edema or calf pain.  Denies recent infection, bleeding, new cough, chest pain, shortness of breath, or any other new specific complaints.  He is working with insurance to fix his birth date which was documented incorrectly on his policy.  All other systems were reviewed with the patient and are negative.  MEDICAL HISTORY:  Past Medical History:  Diagnosis Date   Allergy    seasonal   Amenia    postoperative   Anemia    Benign positional vertigo    CAD (coronary artery disease)    s/p CABG 01/07   Diverticula of colon    Esophageal stricture 08/03/2013   GERD (gastroesophageal reflux disease)    Gilbert's syndrome    possible. (elevated bilirubin)   Hemochromatosis    Hx of adenomatous colonic polyps    Medoff   Hyperlipidemia    Hypertension    Internal hemorrhoid    Memory loss    Myocardial infarction ([place]) 05/2005   Tobacco abuse    Vertigo     SURGICAL HISTORY: Past Surgical History:  Procedure Laterality Date   APPENDECTOMY  2014   lap appy   COLONOSCOPY  2003, 2007, 02/2011   small adenomas   CORONARY ARTERY BYPASS GRAFT     ESOPHAGEAL DILATION  11/19/13   LAPAROSCOPIC  APPENDECTOMY N/A 08/06/2012   Procedure: APPENDECTOMY LAPAROSCOPIC;  Surgeon: Imogene Burn. Tsuei, MD;  Location: [place];  Service: General;  Laterality: N/A;   left knee arthroscopy     MEDIAN STERNOTOMY     for CABG x 4 (left internal mammary artery to distal left anterior descending coronary artery, right internal mammary artery to first circumflex marginal branch, saphenous vein graft to posterior descending coronary artery, saphenous vein graft to diagonal branch, endoscopic saphenous vein harvest from right thigh.) SURGEON: Valentina Gu. Roxy Manns, M.D. CHO/MEDQ D:06/08/05. T: 06/09/2005 Job: [id number]   THERAPEUTIC PHLEBOTOMY  03/21/2015        I have reviewed the social history and family history with the patient and they are unchanged from previous note.  ALLERGIES:  is allergic to niacin and related, pravachol [pravastatin], red yeast rice [cholestin], zetia [ezetimibe], and zocor [simvastatin].  MEDICATIONS:  Current Outpatient Medications  Medication Sig Dispense Refill   aspirin 81 MG tablet Take 1 tablet (81 mg total) by mouth daily.     Evolocumab (REPATHA SURECLICK) 201 MG/ML SOAJ INJECT '140MG'$  INTO THE SKIN EVERY 14 DAYS 2 mL 11   fish oil-omega-3 fatty acids 1000 MG capsule Take 2 g by mouth 2 (two) times daily.     fluticasone (FLONASE) 50 MCG/ACT nasal spray USE 2 SPRAYS INTO THE NOSE DAILY 16 g 1   lisinopril (ZESTRIL) 20 MG tablet TAKE ONE TABLET  BY MOUTH DAILY 90 tablet 3   loratadine (CLARITIN) 10 MG tablet Take 10 mg by mouth daily as needed. For allergies     metoprolol succinate (TOPROL-XL) 25 MG 24 hr tablet TAKE ONE TABLET BY MOUTH DAILY 90 tablet 3   pantoprazole (PROTONIX) 40 MG tablet TAKE ONE TABLET BY MOUTH EVERY MORNING BEFORE BREAKFAST 90 tablet 3   rosuvastatin (CRESTOR) 5 MG tablet TAKE ONE TABLET BY MOUTH ONE TO THREE TIMES PER WEEK AS TOLERATED. SCHEDULE OFFICE VISIT FOR FUTURE REFILLS. 12 tablet 1   No current facility-administered medications for this visit.     PHYSICAL EXAMINATION: ECOG PERFORMANCE STATUS: 0 - Asymptomatic  Vitals:   03/12/22 0900  BP: 134/78  Pulse: (!) 57  Resp: 18  Temp: 97.9 F (36.6 C)  SpO2: 99%   Filed Weights   03/12/22 0900  Weight: 234 lb (106.1 kg)    GENERAL:alert, no distress and comfortable SKIN: no rash  EYES: sclera clear LUNGS: clear with normal breathing effort HEART: regular rate & rhythm, no lower extremity edema ABDOMEN:abdomen soft, non-tender and normal bowel sounds NEURO: alert & oriented x 3 with fluent speech, no focal motor/sensory deficits  LABORATORY DATA:  I have reviewed the data as listed    Latest Ref Rng & Units 03/12/2022    8:42 AM 11/13/2021    8:41 AM 06/23/2021   10:39 AM  CBC  WBC 4.0 - 10.5 K/uL 4.5  4.7  5.4   Hemoglobin 13.0 - 17.0 g/dL 16.6  15.7  16.1   Hematocrit 39.0 - 52.0 % 46.4  44.1  47.7   Platelets 150 - 400 K/uL 167  160  162.0         Latest Ref Rng & Units 04/26/2021    8:42 AM 02/22/2020    8:32 AM 07/15/2018   10:47 AM  CMP  Glucose 70 - 99 mg/dL 95  94    BUN 8 - 27 mg/dL 15  14    Creatinine 0.76 - 1.27 mg/dL 0.98  1.03    Sodium 134 - 144 mmol/L 138  139    Potassium 3.5 - 5.2 mmol/L 4.1  4.4    Chloride 96 - 106 mmol/L 101  102    CO2 20 - 29 mmol/L 24  25    Calcium 8.6 - 10.2 mg/dL 9.5  9.6    Total Protein 6.0 - 8.5 g/dL 6.6  6.9  6.5   Total Bilirubin 0.0 - 1.2 mg/dL 1.6  1.7  1.7   Alkaline Phos 44 - 121 IU/L 50  54  56   AST 0 - 40 IU/L 33  36  28   ALT 0 - 44 IU/L 53  62  47       RADIOGRAPHIC STUDIES: I have personally reviewed the radiological images as listed and agreed with the findings in the report. No results found.   ASSESSMENT & PLAN: Dylan Hudson is a 61 y.o. male with      1. Hemochromatosis, HFE H63D homozygous -He underwent a liver biopsy in 2011, which showed 3-4+ hemosiderin deposition, confirmed hemochromatosis. -His HFE gene mutation test showed H63D/H63D which is not the most common but can cause  hemochromatosis.  He was previously counseled that this is an inheritable disease and it was recommended for his children to get genetic testing. -Children and siblings have declined testing thus far -Dylan Hudson is clinically doing well, has not required a phlebotomy in over a year.   -  Today's labs reviewed, CBC normal, iron saturation 37%. We will schedule phlebotomy if ferritin >50 -Continue lab q4 months, with phlebotomy for ferritin > 50 and/or transferrin saturation > 50% -F/up in 12 months, or sooner if needed   2. Cancer screenings -his last EGD was in 2015 and colonoscopy in 03/2018 by Dr. Arelia Longest, recall 2024 -Per patient he is up-to-date on PSA checks. -Given his risk for liver cirrhosis and HCC, he undergoes periodic abdominal ultrasounds, last done 03/24/2020 showed mildly increased echogenicity without focal lesions.   -He prefers to repeat US with next lab in 4 months, he is currently working on getting his birthdate corrected through his insurance company.   PLAN: -CBC reviewed, f/up pending iron/ferritin from today -Will call with results to schedule phelb if needed -Screening ABD [place] in 4 months -Lab q4 months x3, with phlebotomy if ferritin >50 and/or sat >50% -F/up in 1 year, or sooner if needed    Orders Placed This Encounter  Procedures   US Abdomen Complete    Standing Status:   Future    Standing Expiration Date:   03/12/2023    Order Specific Question:   Reason for Exam (SYMPTOM  OR DIAGNOSIS REQUIRED)    Answer:   Hemochromatosis; HCC/cirrhosis screening ultrasound    Order Specific Question:   Preferred imaging location?    Answer:   [facility]   All questions were answered. The patient knows to call the clinic with any problems, questions or concerns. No barriers to learning were detected.      Alla Feeling, NP 03/12/22

## 2022-03-12 ENCOUNTER — Inpatient Hospital Stay: Payer: Self-pay | Attending: Nurse Practitioner | Admitting: Hematology

## 2022-03-12 ENCOUNTER — Other Ambulatory Visit: Payer: Self-pay

## 2022-03-12 ENCOUNTER — Inpatient Hospital Stay (HOSPITAL_BASED_OUTPATIENT_CLINIC_OR_DEPARTMENT_OTHER): Payer: Self-pay | Admitting: Nurse Practitioner

## 2022-03-12 ENCOUNTER — Encounter: Payer: Self-pay | Admitting: Nurse Practitioner

## 2022-03-12 DIAGNOSIS — Z79899 Other long term (current) drug therapy: Secondary | ICD-10-CM | POA: Insufficient documentation

## 2022-03-12 LAB — CBC WITH DIFFERENTIAL (CANCER CENTER ONLY)
Abs Immature Granulocytes: 0.01 10*3/uL (ref 0.00–0.07)
Basophils Absolute: 0.1 10*3/uL (ref 0.0–0.1)
Basophils Relative: 1 %
Eosinophils Absolute: 0.2 10*3/uL (ref 0.0–0.5)
Eosinophils Relative: 4 %
HCT: 46.4 % (ref 39.0–52.0)
Hemoglobin: 16.6 g/dL (ref 13.0–17.0)
Immature Granulocytes: 0 %
Lymphocytes Relative: 30 %
Lymphs Abs: 1.3 10*3/uL (ref 0.7–4.0)
MCH: 32.4 pg (ref 26.0–34.0)
MCHC: 35.8 g/dL (ref 30.0–36.0)
MCV: 90.6 fL (ref 80.0–100.0)
Monocytes Absolute: 0.6 10*3/uL (ref 0.1–1.0)
Monocytes Relative: 14 %
Neutro Abs: 2.3 10*3/uL (ref 1.7–7.7)
Neutrophils Relative %: 51 %
Platelet Count: 167 10*3/uL (ref 150–400)
RBC: 5.12 MIL/uL (ref 4.22–5.81)
RDW: 12.6 % (ref 11.5–15.5)
WBC Count: 4.5 10*3/uL (ref 4.0–10.5)
nRBC: 0 % (ref 0.0–0.2)

## 2022-03-12 LAB — IRON AND IRON BINDING CAPACITY (CC-WL,HP ONLY)
Iron: 175 ug/dL (ref 45–182)
Saturation Ratios: 37 % (ref 17.9–39.5)
TIBC: 469 ug/dL — ABNORMAL HIGH (ref 250–450)
UIBC: 294 ug/dL (ref 117–376)

## 2022-03-12 LAB — FERRITIN: Ferritin: 23 ng/mL — ABNORMAL LOW (ref 24–336)

## 2022-03-21 ENCOUNTER — Telehealth: Payer: Self-pay

## 2022-03-21 NOTE — Telephone Encounter (Addendum)
Called patient to advise of message below. Patient voiced appreciation for call.  ----- Message from Dylan Jefferson, RN sent at 03/21/2022  9:50 AM EDT -----  ----- Message ----- From: Truitt Merle, MD Sent: 03/18/2022   3:12 PM EDT To: Alla Feeling, NP; Dylan Jefferson, RN  Please let pt know his lab results, no need for phlebotomy now, thanks   YF

## 2022-04-24 ENCOUNTER — Telehealth: Payer: Self-pay | Admitting: Cardiology

## 2022-04-24 NOTE — Telephone Encounter (Signed)
Patient's insurance [employer] called stating a prior-auth is needed for [place].  They stated patient is out of medication.

## 2022-04-24 NOTE — Telephone Encounter (Signed)
This has happened for 2 years in a row now.  Please see telephone encounters dated 01/18/22 and 10/31/20.  Unable to complete prior authorization until insurance company updates his correct DOB.

## 2022-04-24 NOTE — Telephone Encounter (Signed)
Called and spoke with patient- who states that this was supposed to be fixed over a month ago and reports  that he was on the phone with "kyle" from his insurance today for 45 minutes regarding this issue. Attempted to call Marylyn Ishihara to see what could be done to assist but not able to reach. Number is [phone] Ext: [phone]. Advised patient we will try to follow up and see if there is anything we can do from our part. Patient aware and verbalized understanding.

## 2022-04-26 NOTE — Telephone Encounter (Signed)
Left message for Dylan Hudson to return the call

## 2022-04-27 NOTE — Telephone Encounter (Signed)
Kyle calling back for PA. He states they are working on getting DOB corrected in system to April 06, 2061 instead of [birth year]

## 2022-04-27 NOTE — Telephone Encounter (Signed)
Called and spoke with Sherre Lain B at [employer] transferred to [place] he states to call [phone]. I have been on the phone for over 30 min.  Called and spoke with [facility]. She states to call [employer] at [phone]. On the phone again for nearly 30 minutes. Called pt and notified to call [employer] and fix the DOB issue. [phone]. He will call and get straightened out for [place] to do prior authorization will await phone call.

## 2022-05-04 ENCOUNTER — Other Ambulatory Visit: Payer: Self-pay | Admitting: Cardiology

## 2022-05-08 ENCOUNTER — Emergency Department (HOSPITAL_COMMUNITY): Payer: 59

## 2022-05-08 ENCOUNTER — Other Ambulatory Visit: Payer: Self-pay

## 2022-05-08 ENCOUNTER — Emergency Department (HOSPITAL_COMMUNITY)
Admission: EM | Admit: 2022-05-08 | Discharge: 2022-05-09 | Disposition: A | Discharge status: Home / Self Care | Payer: 59 | Attending: Emergency Medicine | Admitting: Emergency Medicine

## 2022-05-08 DIAGNOSIS — Z7982 Long term (current) use of aspirin: Secondary | ICD-10-CM | POA: Diagnosis not present

## 2022-05-08 DIAGNOSIS — Z1152 Encounter for screening for COVID-19: Secondary | ICD-10-CM | POA: Diagnosis not present

## 2022-05-08 DIAGNOSIS — I2510 Atherosclerotic heart disease of native coronary artery without angina pectoris: Secondary | ICD-10-CM | POA: Insufficient documentation

## 2022-05-08 DIAGNOSIS — R42 Dizziness and giddiness: Secondary | ICD-10-CM | POA: Diagnosis present

## 2022-05-08 LAB — CBC
HCT: 46.5 % (ref 39.0–52.0)
Hemoglobin: 16.3 g/dL (ref 13.0–17.0)
MCH: 31.8 pg (ref 26.0–34.0)
MCHC: 35.1 g/dL (ref 30.0–36.0)
MCV: 90.6 fL (ref 80.0–100.0)
Platelets: 161 10*3/uL (ref 150–400)
RBC: 5.13 MIL/uL (ref 4.22–5.81)
RDW: 12.4 % (ref 11.5–15.5)
WBC: 4.5 10*3/uL (ref 4.0–10.5)
nRBC: 0 % (ref 0.0–0.2)

## 2022-05-08 LAB — BASIC METABOLIC PANEL
Anion gap: 8 (ref 5–15)
BUN: 13 mg/dL (ref 8–23)
CO2: 23 mmol/L (ref 22–32)
Calcium: 9 mg/dL (ref 8.9–10.3)
Chloride: 104 mmol/L (ref 98–111)
Creatinine, Ser: 0.89 mg/dL (ref 0.61–1.24)
GFR, Estimated: >60 mL/min (ref >60)
Glucose, Bld: 106 mg/dL — ABNORMAL HIGH (ref 70–99)
Potassium: 3.7 mmol/L (ref 3.5–5.1)
Sodium: 135 mmol/L (ref 135–145)

## 2022-05-08 LAB — PROTIME-INR
INR: 1 (ref 0.8–1.2)
Prothrombin Time: 13.1 seconds (ref 11.4–15.2)

## 2022-05-08 LAB — MAGNESIUM: Magnesium: 2.2 mg/dL (ref 1.7–2.4)

## 2022-05-08 LAB — TROPONIN I (HIGH SENSITIVITY)
Troponin I (High Sensitivity): 5 ng/L (ref <18)
Troponin I (High Sensitivity): 4 ng/L (ref <18)

## 2022-05-08 NOTE — ED Notes (Signed)
Pt experienced a 5 sec window of elevated HR. Unable to capture same on EKG.

## 2022-05-08 NOTE — ED Notes (Signed)
Pt reevaluated by this RN. Pt AxOx4. NAD. Reports he is still having intermittent chest discomfort. Additional lab work added per standing order protocol.

## 2022-05-08 NOTE — ED Provider Notes (Signed)
[facility] EMERGENCY DEPARTMENT Provider Note   CSN: [id number] Arrival date & time: 05/08/22  0800     History  Chief Complaint  Patient presents with   Headache    Dylan Hudson is a 61 y.o. male with medical history of esophageal stricture, MI and quadruple bypass surgery in 2007, anemia, BPV, CAD, GERD, Gilbert's syndrome.  Patient resents to ED for evaluation of "heart fluttering".  Patient reports that on Saturday he had an episode where he became lightheaded while outside doing work.  Patient reports that at this time he sat down and the lightheadedness resolved.  Patient reports that the next day on Sunday he felt very weak all day with generalized fatigue.  Patient reports that this worsened on Monday prompting him to report to ED for evaluation.  The patient states that he has been unable to sleep peacefully the last few months and reports he wakes up frequently at 2:30 AM.  Patient wife states that the patient does snore often.  Patient denies any chest pain, shortness of breath, nausea, vomiting, abdominal pain, fevers.  Patient denies loss of consciousness, one-sided weakness or numbness.   Headache      Home Medications Prior to Admission medications   Medication Sig Start Date End Date Taking? Authorizing Provider  aspirin 81 MG tablet Take 1 tablet (81 mg total) by mouth daily. 12/30/17   Lelon Perla, MD  Evolocumab (REPATHA SURECLICK) 622 MG/ML SOAJ INJECT '140MG'$  INTO THE SKIN EVERY 14 DAYS 10/26/21   Lelon Perla, MD  fish oil-omega-3 fatty acids 1000 MG capsule Take 2 g by mouth 2 (two) times daily.    [provider]  fluticasone (FLONASE) 50 MCG/ACT nasal spray USE 2 SPRAYS INTO THE NOSE DAILY 09/02/12   [name], NP  lisinopril (ZESTRIL) 20 MG tablet TAKE ONE TABLET BY MOUTH DAILY 07/06/21   Lelon Perla, MD  loratadine (CLARITIN) 10 MG tablet Take 10 mg by mouth daily as needed. For allergies    [provider]  metoprolol succinate (TOPROL-XL) 25 MG 24 hr tablet TAKE ONE TABLET BY MOUTH DAILY 07/06/21   Lelon Perla, MD  pantoprazole (PROTONIX) 40 MG tablet TAKE ONE TABLET BY MOUTH EVERY MORNING BEFORE BREAKFAST 10/30/21   Gatha Mayer, MD  rosuvastatin (CRESTOR) 5 MG tablet TAKE 1 TABLET BY MOUTH DAILY ONE TO THREE TIMES PER WEEK AS TOLERATED **APPT FOR REFILLS** 05/04/22   Lelon Perla, MD      Allergies    Niacin and related, Pravachol [pravastatin], Red yeast rice [cholestin], Zetia [ezetimibe], and Zocor [simvastatin]    Review of Systems   Review of Systems  Neurological:  Positive for headaches.    Physical Exam Updated Vital Signs BP (!) 147/84 (BP Location: Right Arm)   Pulse 60   Temp 98.2 F (36.8 C) (Oral)   Resp 16   Ht 5' 10.5" (1.791 m)   Wt 99.8 kg   SpO2 95%   BMI 31.12 kg/m  Physical Exam  ED Results / Procedures / Treatments   Labs (all labs ordered are listed, but only abnormal results are displayed) Labs Reviewed  BASIC METABOLIC PANEL - Abnormal; Notable for the following components:      Result Value   Glucose, Bld 106 (*)    All other components within normal limits  RESP PANEL BY RT-PCR (RSV, FLU A&B, COVID)  RVPGX2  PROTIME-INR  CBC  MAGNESIUM  TROPONIN I (HIGH SENSITIVITY)  TROPONIN I (HIGH SENSITIVITY)    EKG EKG Interpretation  Date/Time:  Tuesday May 08 2022 08:05:26 EST Ventricular Rate:  65 PR Interval:  164 QRS Duration: 90 QT Interval:  410 QTC Calculation: 426 R Axis:   59 Text Interpretation: Normal sinus rhythm Artifact Otherwise within normal limits Confirmed by Carmin Muskrat [phone]) on 05/08/2022 8:23:27 AM  Radiology DG Chest 2 View  Result Date: 05/08/2022 CLINICAL DATA:  New AFib EXAM: CHEST - 2 VIEW COMPARISON:  CXR 06/12/05, CT chest 07/09/18 FINDINGS: Status post median sternotomy and CABG. No pleural effusion. No pneumothorax. No focal airspace opacity. Visualized upper abdomen is  unremarkable. Cardiac and mediastinal contours are unchanged. Inferior most sternotomy wire is fractured, but is otherwise unchanged in positioning. No displaced rib fractures. Degenerative changes of the bilateral glenohumeral and AC joints. IMPRESSION: No acute cardiopulmonary disease. Electronically Signed   By: Marin Roberts M.D.   On: 05/08/2022 09:06    Procedures Procedures  {Document cardiac monitor, telemetry assessment procedure when appropriate:1}  Medications Ordered in ED Medications - No data to display  ED Course/ Medical Decision Making/ A&P                           Medical Decision Making Amount and/or Complexity of Data Reviewed Labs: ordered.   ***  {Document critical care time when appropriate:1} {Document review of labs and clinical decision tools ie heart score, Chads2Vasc2 etc:1}  {Document your independent review of radiology images, and any outside records:1} {Document your discussion with family members, caretakers, and with consultants:1} {Document social determinants of health affecting pt's care:1} {Document your decision making why or why not admission, treatments were needed:1} Final Clinical Impression(s) / ED Diagnoses Final diagnoses:  None    Rx / DC Orders ED Discharge Orders     None

## 2022-05-08 NOTE — ED Triage Notes (Signed)
Pt. Stated. Ive been in and out of A Fib  since Sat

## 2022-05-08 NOTE — ED Provider Triage Note (Signed)
Emergency Medicine Provider Triage Evaluation Note  Dylan Hudson , a 61 y.o. male  was evaluated in triage.  Pt complains of episodic palpitations, lightheadedness, dyspnea.  Episodes occur without obvious precipitant, last several months, resolved spontaneously.  Patient is currently asymptomatic.  He has a history of cardiac disease including bypass in the distant past, has been compliant with medications.  Patient has concern for possible A-fib.  Review of Systems  Positive: Per HPI Negative: Syncope, actual chest pain  Physical Exam  BP (!) 143/90 (BP Location: Right Arm)   Pulse 60   Temp (!) 97.4 F (36.3 C)   Resp 16   SpO2 98%  Gen:   Awake, no distress speaking clearly Resp:  Normal effort no increased work of breathing MSK:   Moves extremities without difficulty no deformity Other:  Neuro unremarkable  Medical Decision Making  Medically screening exam initiated at 8:20 AM.  Appropriate orders placed.  Dylan Hudson was informed that the remainder of the evaluation will be completed by another provider, this initial triage assessment does not replace that evaluation, and the importance of remaining in the ED until their evaluation is complete.   Dylan Muskrat, MD 05/08/22 [phone]

## 2022-05-09 LAB — RESP PANEL BY RT-PCR (RSV, FLU A&B, COVID)  RVPGX2
Influenza A by PCR: NEGATIVE
Influenza B by PCR: NEGATIVE
Resp Syncytial Virus by PCR: NEGATIVE
SARS Coronavirus 2 by RT PCR: NEGATIVE

## 2022-05-09 NOTE — Discharge Instructions (Addendum)
Return to ED with any new or worsening signs or symptoms Please follow-up with cardiology for possible Holter monitor Please read attached concerning dizziness Follow-up on the results of your viral testing done here today on MyChart

## 2022-05-10 ENCOUNTER — Telehealth: Payer: Self-pay | Admitting: Cardiology

## 2022-05-10 NOTE — Telephone Encounter (Signed)
Patient callling to the nurse about his ER stay. Please advise

## 2022-05-10 NOTE — Telephone Encounter (Signed)
Returned call to patient who reports ED visit on Saturday 05/05/2022 pt presented to the ED with concern for Afib.  Pt said he had seen a commercial about Afib and he felt lightheaded so he thought he might be in Afib.  Pt requested Dr. Stanford Breed be aware of the ED visit.  Pt would like to see if he can get an appt sooner than April with Dr. Stanford Breed.  Explained RN would forward message to Dr. Stanford Breed and his nurse Hilda Blades.  Also discussed Bp.  Patient states he is taking his Bp at least 5x/day and noted it has been higher than usual.  Encouraged patient to check is Bp 1 to 2 hrs after medications and at least 5 minutes of rest.  Pt verbalized understanding and will send blood pressures via My Chart next week. Georgana Curio MHA RN CCM

## 2022-05-11 NOTE — Telephone Encounter (Signed)
Spoke with pt, Follow up scheduled  

## 2022-05-20 NOTE — Progress Notes (Deleted)
Cardiology Clinic Note   Patient Name: Dylan Hudson Date of Encounter: 05/20/2022  Primary Care Provider:  Marrian Salvage, [place] Primary Cardiologist:  Kirk Ruths, MD  Patient Profile    Dylan Hudson is a 61 y.o. male with a past medical history of CAD s/p CABG x 24 May 2005, hypertension, hyperlipidemia, hemochromatosis who presents to the clinic today for hospital follow-up.   Past Medical History    Past Medical History:  Diagnosis Date   Allergy    seasonal   Amenia    postoperative   Anemia    Benign positional vertigo    CAD (coronary artery disease)    s/p CABG 01/07   Diverticula of colon    Esophageal stricture 08/03/2013   GERD (gastroesophageal reflux disease)    Gilbert's syndrome    possible. (elevated bilirubin)   Hemochromatosis    Hx of adenomatous colonic polyps    Medoff   Hyperlipidemia    Hypertension    Internal hemorrhoid    Memory loss    Myocardial infarction ([place]) 05/2005   Tobacco abuse    Vertigo    Past Surgical History:  Procedure Laterality Date   APPENDECTOMY  2014   lap appy   COLONOSCOPY  2003, 2007, 02/2011   small adenomas   CORONARY ARTERY BYPASS GRAFT     ESOPHAGEAL DILATION  11/19/13   LAPAROSCOPIC APPENDECTOMY N/A 08/06/2012   Procedure: APPENDECTOMY LAPAROSCOPIC;  Surgeon: Imogene Burn. Tsuei, MD;  Location: [facility];  Service: General;  Laterality: N/A;   left knee arthroscopy     MEDIAN STERNOTOMY     for CABG x 4 (left internal mammary artery to distal left anterior descending coronary artery, right internal mammary artery to first circumflex marginal branch, saphenous vein graft to posterior descending coronary artery, saphenous vein graft to diagonal branch, endoscopic saphenous vein harvest from right thigh.) SURGEON: Valentina Gu. Roxy Manns, M.D. CHO/MEDQ D:06/08/05. T: 06/09/2005 Job: [id number]   THERAPEUTIC PHLEBOTOMY  03/21/2015        Allergies  Allergies  Allergen Reactions   Niacin And Related Other (See  Comments)    Chest pain   Pravachol [Pravastatin]     Myalgia    Red Yeast Rice [Cholestin]     Jitters    Zetia [Ezetimibe] Other (See Comments)    Joint pain   Zocor [Simvastatin]     REACTION: Myalgias    History of Present Illness    Dylan Hudson has a past medical history of: CAD.  LHC 06/04/2005: Proximal LAD 70-80% tubular stenosis, 99% just before take off of diagonal. Ramus 70% in upper branch and 99% in lower branch. Mid LCx 30% tubular stenosis after take off of OM1. Distal RCA totally occluded with left to right collaterals. EF 45%. Refer to CTS.  CABG x 4 06/04/2005: RIMA to OM1, LIMA to LAD. SVG to diagonal, SVG to PDA. Echo 08/14/2010: EF 55-60%. Mildly dilated left atrium. Trivial MR. Mild TR.  Stress test 08/15/2011: Low risk study. Small, moderate intensity fixed apical anterior perfusion defect that may represent small prior infarct with no ischemia. Echo 12/14/2015: EF 55-60%. Mildly dilated left atrium.   Echo 03/08/2020: EF 60-65%. Moderately dilated right atrium. Mild aortic dilatation 40 mm.  Hypertension. Hyperlipidemia.  Lipid panel 04/26/2021: LDL 97, HDL 36, TG 172, total 163.  Hemochromatosis.  Followed by hematology.   [name] was first evaluated by cardiology during a hospitalization for NSTEMI in January 2007. He underwent LHC which  showed multivessel CAD and mildly decreased LV function. He underwent CABG x 4. Patient was last seen in the office by Dr. Stanford Breed on 03/10/2021. At that time he complains of dyspnea with vigorous activities.   More recently, patient was evaluated in the ED on 05/08/2022 for a several month history of intermittent palpitations, lightheadedness and dyspnea. Patient was concerned he was having runs of afib. His EKG showed NSR. Troponin x 2 were normal. Blood counts, magnesium, electrolytes and kidney function were normal. Patient was discharged home.   Today, patient ***  Zio? Echo? Taking repatha? Lipids?   CAD. S/p CABG  x 4 in January 2007. Stress test March 2013 was a low risk study. Echo in October 2021 showed EF 60-65% and mild aortic dilatation. Patient *** Continue aspirin, Repatha, metoprolol and Crestor.  Palpitations. Patient was evaluated in the ED in December 2023 for palpitations, lightheadedness and dizziness. He was in normal sinus rhythm at that time. Patient reports *** Hypertension. BP today *** Patient denies dizziness, headaches, or vision changes. Continue metoprolol.  Hyperlipidemia. LDL December 2022 97, not at goal. ***  Home Medications    No outpatient medications have been marked as taking for the 05/22/22 encounter (Appointment) with Almyra Deforest, [place].    Family History    Family History  Problem Relation Age of Onset   Colon polyps Mother    Colon cancer Paternal Uncle    Heart attack Maternal Grandfather    Breast cancer Neg Hx    Prostate cancer Neg Hx    Esophageal cancer Neg Hx    Stomach cancer Neg Hx    Rectal cancer Neg Hx    He indicated that his mother is alive. He indicated that his father is alive. He indicated that his maternal grandmother is deceased. He indicated that his maternal grandfather is deceased. He indicated that his paternal grandfather is deceased. He indicated that his paternal uncle is deceased. He indicated that the status of his neg hx is unknown.   Social History    Social History   Socioeconomic History   Marital status: Divorced    Spouse name: Not on file   Number of children: 1   Years of education: 12   Highest education level: Not on file  Occupational History    Comment: Camera operator   Occupation: SERVICE MANAGER    Employer: [employer]  Tobacco Use   Smoking status: Former    Packs/day: 2.00    Years: 36.00    Total pack years: 72.00    Types: Cigarettes    Quit date: 03/07/2006    Years since quitting: 16.2   Smokeless tobacco: Never   Tobacco comments:    smoked since age 33 approx. 1 pact to 1 and a  half packs a day. fortunately after his bypass surgery he completely discontinued smoking  Vaping Use   Vaping Use: Never used  Substance and Sexual Activity   Alcohol use: Yes    Alcohol/week: 4.0 standard drinks of alcohol    Types: 4 Cans of beer per week    Comment: socially   Drug use: No   Sexual activity: Not on file  Other Topics Concern   Not on file  Social History Narrative   Lives with girlfriend. Fishing is a hobby.    Fun: Go to the lake every weekend.    Social Determinants of Health   Financial Resource Strain: Not on file  Food Insecurity: Not on file  Transportation  Needs: Not on file  Physical Activity: Not on file  Stress: Not on file  Social Connections: Not on file  Intimate Partner Violence: Not on file     Review of Systems    General: *** No chills, fever, night sweats or weight changes.  Cardiovascular:  No chest pain, dyspnea on exertion, edema, orthopnea, palpitations, paroxysmal nocturnal dyspnea. Dermatological: No rash, lesions/masses Respiratory: No cough, dyspnea Urologic: No hematuria, dysuria Abdominal:   No nausea, vomiting, diarrhea, bright red blood per rectum, melena, or hematemesis Neurologic:  No visual changes, weakness, changes in mental status. All other systems reviewed and are otherwise negative except as noted above.  Physical Exam    VS:  There were no vitals taken for this visit. , BMI There is no height or weight on file to calculate BMI. GEN: *** Well nourished, well developed, in no acute distress. HEENT: Normal. Neck: Supple, no JVD, carotid bruits, or masses. Cardiac: RRR, no murmurs, rubs, or gallops. No clubbing, cyanosis, edema.  Radials/DP/PT 2+ and equal bilaterally.  Respiratory:  Respirations regular and unlabored, clear to auscultation bilaterally. GI: Soft, nontender, nondistended. MS: No deformity or atrophy. Skin: Warm and dry, no rash. Neuro: Strength and sensation are intact. Psych: Normal  affect.  Accessory Clinical Findings    The following studies were reviewed for this visit: ***  Recent Labs: 05/08/2022: BUN 13; Creatinine, Ser 0.89; Hemoglobin 16.3; Magnesium 2.2; Platelets 161; Potassium 3.7; Sodium 135   Recent Lipid Panel    Component Value Date/Time   CHOL 163 04/26/2021 0842   TRIG 172 (H) 04/26/2021 0842   HDL 36 (L) 04/26/2021 0842   CHOLHDL 4.5 04/26/2021 0842   CHOLHDL 7.5 (H) 11/29/2015 0857   VLDL 43 (H) 11/29/2015 0857   LDLCALC 97 04/26/2021 0842   LDLDIRECT 159.7 02/15/2014 1036    No BP recorded.  {Refresh Note OR Click here to enter BP  :1}***    ECG personally reviewed by me today: ***  No significant changes from ***  {Does this patient have ATRIAL FIBRILLATION?[phone]}   Assessment & Plan   ***      Disposition: ***   Justice Britain. Anjelique Makar, DNP, NP-C     05/20/2022, 3:04 PM [place] [address] Office [phone] Fax [phone]

## 2022-05-22 ENCOUNTER — Ambulatory Visit: Payer: 59 | Admitting: Physician Assistant

## 2022-06-05 ENCOUNTER — Other Ambulatory Visit (HOSPITAL_COMMUNITY): Payer: Self-pay

## 2022-06-18 NOTE — Progress Notes (Unsigned)
Cardiology Clinic Note   Patient Name: Dylan Hudson Date of Encounter: 06/21/2022  Primary Care Provider:  Marrian Salvage, [place] Primary Cardiologist:  Kirk Ruths, MD  Patient Profile    Dylan Hudson is a 62 y.o. male with a past medical history of CAD S/P CABG x 4 2007, hypertension, hyperlipidemia, hemochromatosis who presents to the clinic today for follow-up of palpitations.  Past Medical History    Past Medical History:  Diagnosis Date   Allergy    seasonal   Amenia    postoperative   Anemia    Benign positional vertigo    CAD (coronary artery disease)    s/p CABG 01/07   Diverticula of colon    Esophageal stricture 08/03/2013   GERD (gastroesophageal reflux disease)    Gilbert's syndrome    possible. (elevated bilirubin)   Hemochromatosis    Hx of adenomatous colonic polyps    Medoff   Hyperlipidemia    Hypertension    Internal hemorrhoid    Memory loss    Myocardial infarction ([place]) 05/2005   Tobacco abuse    Vertigo    Past Surgical History:  Procedure Laterality Date   APPENDECTOMY  2014   lap appy   COLONOSCOPY  2003, 2007, 02/2011   small adenomas   CORONARY ARTERY BYPASS GRAFT     ESOPHAGEAL DILATION  11/19/13   LAPAROSCOPIC APPENDECTOMY N/A 08/06/2012   Procedure: APPENDECTOMY LAPAROSCOPIC;  Surgeon: Imogene Burn. Tsuei, MD;  Location: [facility];  Service: General;  Laterality: N/A;   left knee arthroscopy     MEDIAN STERNOTOMY     for CABG x 4 (left internal mammary artery to distal left anterior descending coronary artery, right internal mammary artery to first circumflex marginal branch, saphenous vein graft to posterior descending coronary artery, saphenous vein graft to diagonal branch, endoscopic saphenous vein harvest from right thigh.) SURGEON: Valentina Gu. Roxy Manns, M.D. CHO/MEDQ D:06/08/05. T: 06/09/2005 Job: [id number]   THERAPEUTIC PHLEBOTOMY  03/21/2015        Allergies  Allergies  Allergen Reactions   Niacin And Related Other (See  Comments)    Chest pain   Pravachol [Pravastatin]     Myalgia    Red Yeast Rice [Cholestin]     Jitters    Zetia [Ezetimibe] Other (See Comments)    Joint pain   Zocor [Simvastatin]     REACTION: Myalgias    History of Present Illness    Dylan Hudson has a past medical history of: CAD. LHC 06/04/2005 (NSTEMI): Proximal LAD 70 to 80%, focal stenosis 99% just after takeoff of diagonal.  Ramus 70%, mid LCx 30%.  Distal RCA 100%.  CTS referral. CABG x 4 06/08/2005: SVG to PDA, SVG to diagonal, RIMA to OM1, LIMA to LAD. Echo 03/08/2020: EF 60 to 65%.  Moderate RAE.  Mild dilatation of ascending aorta 40 mm. Hypertension. Hyperlipidemia. Lipid panel 04/26/2021: LDL 97, HDL 36, TG 172, total 163.  Hemochromatosis.  Dylan Hudson was first evaluated by Dr. Stanford Breed on 06/04/2005 during hospitalization for NSTEMI.  Patient was last seen in the office by Dr. Stanford Breed on 03/10/2021.  At that time he was doing well and no medication changes were made.  Most recently, patient presented to the emergency department on 05/08/2022 for palpitations and concern for A-fib.  EKG captured at that time showed normal sinus rhythm, heart rate 65.  Today, patient is here to follow-up from his ED visit for palpitations. Patient reports on 05/08/2022 he awoke  at around 2am with his heart racing. For the next several hours he felt intermittent palpitations sometimes lasting just a couple of minutes and sometimes going on for over an hour. He then presented to the ED and was there essentially all day waiting to see a provider.  His labs and EKG were normal.  He denies fever, chills, or cold symptoms at that time.  He does have hemochromatosis which he controls in part by not eating red meat.  His girlfriend was doing research while they were in the ED and felt that maybe his iron and ferritin levels were off and that is why he was having palpitations.  He ate red meat for 2 days and has experienced recurrence of  palpitations.  He has a Kardia mobile but did not utilize it at the time he was having palpitations.  10 days after discharge from ED patient tested positive for COVID.  He did not have any palpitations during his illness. He is active walking 1.5 miles daily.  He has not had any palpitations with exercise. Patient denies shortness of breath or dyspnea on exertion. No chest pain, pressure, or tightness. Denies lower extremity edema, orthopnea, or PND.    Home Medications    Current Meds  Medication Sig   aspirin 81 MG tablet Take 1 tablet (81 mg total) by mouth daily.   Evolocumab (REPATHA SURECLICK) 628 MG/ML SOAJ INJECT '140MG'$  INTO THE SKIN EVERY 14 DAYS   fish oil-omega-3 fatty acids 1000 MG capsule Take 2 g by mouth 2 (two) times daily.   fluticasone (FLONASE) 50 MCG/ACT nasal spray USE 2 SPRAYS INTO THE NOSE DAILY   lisinopril (ZESTRIL) 20 MG tablet TAKE ONE TABLET BY MOUTH DAILY   loratadine (CLARITIN) 10 MG tablet Take 10 mg by mouth daily as needed. For allergies   metoprolol succinate (TOPROL-XL) 25 MG 24 hr tablet TAKE ONE TABLET BY MOUTH DAILY   pantoprazole (PROTONIX) 40 MG tablet TAKE ONE TABLET BY MOUTH EVERY MORNING BEFORE BREAKFAST   rosuvastatin (CRESTOR) 5 MG tablet TAKE 1 TABLET BY MOUTH DAILY ONE TO THREE TIMES PER WEEK AS TOLERATED **APPT FOR REFILLS**    Family History    Family History  Problem Relation Age of Onset   Colon polyps Mother    Colon cancer Paternal Uncle    Heart attack Maternal Grandfather    Breast cancer Neg Hx    Prostate cancer Neg Hx    Esophageal cancer Neg Hx    Stomach cancer Neg Hx    Rectal cancer Neg Hx    He indicated that his mother is alive. He indicated that his father is alive. He indicated that his maternal grandmother is deceased. He indicated that his maternal grandfather is deceased. He indicated that his paternal grandfather is deceased. He indicated that his paternal uncle is deceased. He indicated that the status of his neg hx  is unknown.   Social History    Social History   Socioeconomic History   Marital status: Divorced    Spouse name: Not on file   Number of children: 1   Years of education: 12   Highest education level: Not on file  Occupational History    Comment: Camera operator   Occupation: SERVICE MANAGER    Employer: [employer]  Tobacco Use   Smoking status: Former    Packs/day: 2.00    Years: 36.00    Total pack years: 72.00    Types: Cigarettes    Quit  date: 03/07/2006    Years since quitting: 16.3   Smokeless tobacco: Never   Tobacco comments:    smoked since age 68 approx. 1 pact to 1 and a half packs a day. fortunately after his bypass surgery he completely discontinued smoking  Vaping Use   Vaping Use: Never used  Substance and Sexual Activity   Alcohol use: Yes    Alcohol/week: 4.0 standard drinks of alcohol    Types: 4 Cans of beer per week    Comment: socially   Drug use: No   Sexual activity: Not on file  Other Topics Concern   Not on file  Social History Narrative   Lives with girlfriend. Fishing is a hobby.    Fun: Go to the lake every weekend.    Social Determinants of Health   Financial Resource Strain: Not on file  Food Insecurity: Not on file  Transportation Needs: Not on file  Physical Activity: Not on file  Stress: Not on file  Social Connections: Not on file  Intimate Partner Violence: Not on file     Review of Systems    General:  No chills, fever, night sweats or weight changes.  Cardiovascular:  No chest pain, dyspnea on exertion, edema, orthopnea, palpitations, paroxysmal nocturnal dyspnea. Dermatological: No rash, lesions/masses Respiratory: No cough, dyspnea Urologic: No hematuria, dysuria Abdominal:   No nausea, vomiting, diarrhea, bright red blood per rectum, melena, or hematemesis Neurologic:  No visual changes, weakness, changes in mental status. All other systems reviewed and are otherwise negative except as noted  above.  Physical Exam    VS:  BP 128/84   Pulse 60   Ht '5\' 10"'$  (1.778 m)   Wt 230 lb 6.4 oz (104.5 kg)   SpO2 100%   BMI 33.06 kg/m  , BMI Body mass index is 33.06 kg/m. GEN:  Well nourished, well developed, in no acute distress. HEENT: Normal. Neck: Supple, no JVD, carotid bruits, or masses. Cardiac: RRR, no murmurs, rubs, or gallops. No clubbing, cyanosis, edema.  Radials/DP/PT 2+ and equal bilaterally.  Respiratory:  Respirations regular and unlabored, clear to auscultation bilaterally. GI: Soft, nontender, nondistended. MS: No deformity or atrophy. Skin: Warm and dry, no rash. Neuro: Strength and sensation are intact. Psych: Normal affect.  Accessory Clinical Findings     Recent Labs: 05/08/2022: BUN 13; Creatinine, Ser 0.89; Hemoglobin 16.3; Magnesium 2.2; Platelets 161; Potassium 3.7; Sodium 135   Recent Lipid Panel    Component Value Date/Time   CHOL 163 04/26/2021 0842   TRIG 172 (H) 04/26/2021 0842   HDL 36 (L) 04/26/2021 0842   CHOLHDL 4.5 04/26/2021 0842   CHOLHDL 7.5 (H) 11/29/2015 0857   VLDL 43 (H) 11/29/2015 0857   LDLCALC 97 04/26/2021 0842   LDLDIRECT 159.7 02/15/2014 1036    ECG not indicated today.  EKG from emergency department 05/08/2022 was reviewed: Normal sinus rhythm, rate 65 bpm.  No significant change from 02/22/2020.   Assessment & Plan   Palpitations.  Patient had approximately 24 hours of intermittent palpitations described as racing heart sometimes lasting just a few minutes and other times lasting for over an hour.  He was evaluated in the emergency department and labs and EKG were negative.  Patient has not had palpitations since.  Discussed holding off on event monitor secondary to lack of recurrence.  He does have a Kardia mobile and is instructed to utilize it if palpitations return and then call office for follow-up appointment.  He is in agreement  with plan. CAD.  S/p CABG x 4 2007.  Patient denies chest pain, tightness or pressure.  Continue aspirin, metoprolol, Repatha, Crestor. Hypertension.  BP today 128/84.  Patient denies headaches or dizziness.  Continue lisinopril and metoprolol. Hyperlipidemia.  LDL December 2022 97, not at goal.  Patient has not been taking Repatha secondary to an error with his date of birth and being unable to have the medication covered.  It has been a prolonged issue but with the help of our office staff he will is able to get it fixed and has restarted this month.  Will have patient return for repeat lipid panel after his sixth dose of Repatha.     Disposition: Lipid panel and LFTs in approximately 2 months (after sixth dose of Repatha).  Return in 1 year or sooner as needed.   Dylan Britain. Fahd Galea, DNP, NP-C     06/21/2022, 11:52 AM [place] [address] Office [phone] Fax [phone]

## 2022-06-21 ENCOUNTER — Encounter: Payer: Self-pay | Admitting: General Practice

## 2022-06-21 ENCOUNTER — Ambulatory Visit: Payer: Self-pay | Attending: Physician Assistant | Admitting: Student

## 2022-06-21 VITALS — BP 128/84 | HR 60 | Ht 70.0 in | Wt 230.4 lb

## 2022-06-21 DIAGNOSIS — Z951 Presence of aortocoronary bypass graft: Secondary | ICD-10-CM

## 2022-06-21 DIAGNOSIS — E785 Hyperlipidemia, unspecified: Secondary | ICD-10-CM

## 2022-06-21 DIAGNOSIS — I2510 Atherosclerotic heart disease of native coronary artery without angina pectoris: Secondary | ICD-10-CM

## 2022-06-21 DIAGNOSIS — I10 Essential (primary) hypertension: Secondary | ICD-10-CM

## 2022-06-21 NOTE — Patient Instructions (Signed)
Medication Instructions:  The current medical regimen is effective;  continue present plan and medications as directed. Please refer to the Current Medication list given to you today.  *If you need a refill on your cardiac medications before your next appointment, please call your pharmacy*  Lab Work: FASTING LIPID AND LFT IN 1  MONTH. If you have labs (blood work) drawn today and your tests are completely normal, you will receive your results only by: MyChart Message (if you have MyChart) ORA paper copy in the mail If you have any lab test that is abnormal or we need to change your treatment, we will call you to review the results.  Testing/Procedures: NONE  Follow-Up: At [facility], you and your health needs are our priority.  As part of our continuing mission to provide you with exceptional heart care, we have created designated Provider Care Teams.  These Care Teams include your primary Cardiologist (physician) and Advanced Practice Providers (APPs -  Physician Assistants and Nurse Practitioners) who all work together to provide you with the care you need, when you need it.  Your next appointment:   12 month(s)  Provider:   Kirk Ruths, MD     Other Instructions

## 2022-07-04 ENCOUNTER — Other Ambulatory Visit: Payer: Self-pay | Admitting: Cardiology

## 2022-07-09 ENCOUNTER — Inpatient Hospital Stay: Payer: 59 | Attending: Nurse Practitioner

## 2022-07-09 LAB — CBC WITH DIFFERENTIAL (CANCER CENTER ONLY)
Abs Immature Granulocytes: 0.01 10*3/uL (ref 0.00–0.07)
Basophils Absolute: 0.1 10*3/uL (ref 0.0–0.1)
Basophils Relative: 1 %
Eosinophils Absolute: 0.2 10*3/uL (ref 0.0–0.5)
Eosinophils Relative: 4 %
HCT: 45.8 % (ref 39.0–52.0)
Hemoglobin: 16.3 g/dL (ref 13.0–17.0)
Immature Granulocytes: 0 %
Lymphocytes Relative: 35 %
Lymphs Abs: 1.5 10*3/uL (ref 0.7–4.0)
MCH: 32.8 pg (ref 26.0–34.0)
MCHC: 35.6 g/dL (ref 30.0–36.0)
MCV: 92.2 fL (ref 80.0–100.0)
Monocytes Absolute: 0.5 10*3/uL (ref 0.1–1.0)
Monocytes Relative: 11 %
Neutro Abs: 2.2 10*3/uL (ref 1.7–7.7)
Neutrophils Relative %: 49 %
Platelet Count: 164 10*3/uL (ref 150–400)
RBC: 4.97 MIL/uL (ref 4.22–5.81)
RDW: 13.2 % (ref 11.5–15.5)
WBC Count: 4.4 10*3/uL (ref 4.0–10.5)
nRBC: 0 % (ref 0.0–0.2)

## 2022-07-09 LAB — FERRITIN: Ferritin: 32 ng/mL (ref 24–336)

## 2022-07-09 LAB — IRON AND IRON BINDING CAPACITY (CC-WL,HP ONLY)
Iron: 115 ug/dL (ref 45–182)
Saturation Ratios: 27 % (ref 17.9–39.5)
TIBC: 428 ug/dL (ref 250–450)
UIBC: 313 ug/dL (ref 117–376)

## 2022-07-12 ENCOUNTER — Ambulatory Visit (HOSPITAL_COMMUNITY)
Admission: RE | Admit: 2022-07-12 | Discharge: 2022-07-12 | Disposition: A | Discharge status: Home / Self Care | Payer: 59 | Source: Ambulatory Visit | Attending: Nurse Practitioner | Admitting: Nurse Practitioner

## 2022-07-17 ENCOUNTER — Encounter: Payer: Self-pay | Admitting: Hematology

## 2022-08-20 ENCOUNTER — Other Ambulatory Visit: Payer: Self-pay | Admitting: Cardiology

## 2022-11-07 ENCOUNTER — Inpatient Hospital Stay: Payer: 59 | Attending: Nurse Practitioner

## 2022-11-07 ENCOUNTER — Other Ambulatory Visit: Payer: Self-pay

## 2022-11-07 LAB — IRON AND IRON BINDING CAPACITY (CC-WL,HP ONLY)
Iron: 180 ug/dL (ref 45–182)
Saturation Ratios: 40 % — ABNORMAL HIGH (ref 17.9–39.5)
TIBC: 451 ug/dL — ABNORMAL HIGH (ref 250–450)
UIBC: 271 ug/dL (ref 117–376)

## 2022-11-07 LAB — CBC WITH DIFFERENTIAL (CANCER CENTER ONLY)
Abs Immature Granulocytes: 0.01 10*3/uL (ref 0.00–0.07)
Basophils Absolute: 0.1 10*3/uL (ref 0.0–0.1)
Basophils Relative: 1 %
Eosinophils Absolute: 0.2 10*3/uL (ref 0.0–0.5)
Eosinophils Relative: 4 %
HCT: 46.7 % (ref 39.0–52.0)
Hemoglobin: 16.6 g/dL (ref 13.0–17.0)
Immature Granulocytes: 0 %
Lymphocytes Relative: 31 %
Lymphs Abs: 1.5 10*3/uL (ref 0.7–4.0)
MCH: 33 pg (ref 26.0–34.0)
MCHC: 35.5 g/dL (ref 30.0–36.0)
MCV: 92.8 fL (ref 80.0–100.0)
Monocytes Absolute: 0.7 10*3/uL (ref 0.1–1.0)
Monocytes Relative: 13 %
Neutro Abs: 2.5 10*3/uL (ref 1.7–7.7)
Neutrophils Relative %: 51 %
Platelet Count: 157 10*3/uL (ref 150–400)
RBC: 5.03 MIL/uL (ref 4.22–5.81)
RDW: 12.3 % (ref 11.5–15.5)
WBC Count: 4.9 10*3/uL (ref 4.0–10.5)
nRBC: 0 % (ref 0.0–0.2)

## 2022-11-07 LAB — FERRITIN: Ferritin: 28 ng/mL (ref 24–336)

## 2022-11-09 ENCOUNTER — Other Ambulatory Visit: Payer: Self-pay | Admitting: Cardiology

## 2022-11-14 ENCOUNTER — Other Ambulatory Visit: Payer: Self-pay | Admitting: Internal Medicine

## 2022-12-19 NOTE — Progress Notes (Signed)
This encounter was created in error - please disregard.

## 2022-12-28 ENCOUNTER — Other Ambulatory Visit: Payer: Self-pay | Admitting: Cardiology

## 2023-01-25 ENCOUNTER — Ambulatory Visit (INDEPENDENT_AMBULATORY_CARE_PROVIDER_SITE_OTHER): Payer: 59 | Admitting: Family

## 2023-01-25 ENCOUNTER — Encounter: Payer: Self-pay | Admitting: Family

## 2023-01-25 VITALS — BP 134/70 | HR 62 | Temp 98.2°F | Resp 20 | Ht 70.0 in | Wt 236.4 lb

## 2023-01-25 DIAGNOSIS — Z125 Encounter for screening for malignant neoplasm of prostate: Secondary | ICD-10-CM | POA: Diagnosis not present

## 2023-01-25 DIAGNOSIS — Z0000 Encounter for general adult medical examination without abnormal findings: Secondary | ICD-10-CM

## 2023-01-25 DIAGNOSIS — Z86010 Personal history of colonic polyps: Secondary | ICD-10-CM

## 2023-01-25 DIAGNOSIS — Z1211 Encounter for screening for malignant neoplasm of colon: Secondary | ICD-10-CM

## 2023-01-25 NOTE — Progress Notes (Signed)
Dylan Hudson is a 62 y.o. male with the following history as recorded in EpicCare:  Patient Active Problem List   Diagnosis Date Noted   Internal and external prolapsed hemorrhoids 05/01/2018   History of nicotine dependence 02/14/2016   Routine general medical examination at a health care facility 08/14/2013   Hyperglycemia 08/13/2012   S/P appendectomy 08/06/2012   Rhinitis 07/28/2011   LIBIDO, DECREASED 08/02/2010   Hereditary hemochromatosis (HCC) 10/05/2009   GILBERT'S SYNDROME 10/05/2009   MEMORY LOSS 11/14/2007   Coronary atherosclerosis 07/14/2007   ADENOMATOUS COLONIC POLYPS, HX OF 07/14/2007   Hyperlipidemia 03/28/2007   Essential hypertension 03/28/2007    Current Outpatient Medications  Medication Sig Dispense Refill   aspirin 81 MG tablet Take 1 tablet (81 mg total) by mouth daily.     Evolocumab (REPATHA SURECLICK) 140 MG/ML SOAJ INJECT THE CONTENTS OF ONE PEN INTO THE SKIN EVERY 14 DAYS 2 mL 11   fish oil-omega-3 fatty acids 1000 MG capsule Take 2 g by mouth 2 (two) times daily.     fluticasone (FLONASE) 50 MCG/ACT nasal spray USE 2 SPRAYS INTO THE NOSE DAILY 16 g 1   lisinopril (ZESTRIL) 20 MG tablet TAKE 1 TABLET BY MOUTH DAILY 90 tablet 1   loratadine (CLARITIN) 10 MG tablet Take 10 mg by mouth daily as needed. For allergies     metoprolol succinate (TOPROL-XL) 25 MG 24 hr tablet TAKE 1 TABLET BY MOUTH DAILY 90 tablet 1   pantoprazole (PROTONIX) 40 MG tablet Take 1 tablet (40 mg total) by mouth daily with breakfast. Call for appointment please 90 tablet 0   rosuvastatin (CRESTOR) 5 MG tablet TAKE 1 TABLET BY MOUTH DAILY ONE TO 3 TIMES A WEEK AS TOLERATED NEED APPOINTMENT FOR REFILLS 30 tablet 5   No current facility-administered medications for this visit.    Allergies: Niacin and related, Pravachol [pravastatin], Red yeast rice [cholestin], Zetia [ezetimibe], and Zocor [simvastatin]  Past Medical History:  Diagnosis Date   Allergy    seasonal   Amenia     postoperative   Anemia    Benign positional vertigo    CAD (coronary artery disease)    s/p CABG 01/07   Diverticula of colon    Esophageal stricture 08/03/2013   GERD (gastroesophageal reflux disease)    Gilbert's syndrome    possible. (elevated bilirubin)   Hemochromatosis    Hx of adenomatous colonic polyps    Medoff   Hyperlipidemia    Hypertension    Internal hemorrhoid    Memory loss    Myocardial infarction (HCC) 05/2005   Tobacco abuse    Vertigo     Past Surgical History:  Procedure Laterality Date   APPENDECTOMY  2014   lap appy   COLONOSCOPY  2003, 2007, 02/2011   small adenomas   CORONARY ARTERY BYPASS GRAFT     ESOPHAGEAL DILATION  11/19/13   LAPAROSCOPIC APPENDECTOMY N/A 08/06/2012   Procedure: APPENDECTOMY LAPAROSCOPIC;  Surgeon: [name]. Tsuei, MD;  Location: [facility];  Service: General;  Laterality: N/A;   left knee arthroscopy     MEDIAN STERNOTOMY     for CABG x 4 (left internal mammary artery to distal left anterior descending coronary artery, right internal mammary artery to first circumflex marginal branch, saphenous vein graft to posterior descending coronary artery, saphenous vein graft to diagonal branch, endoscopic saphenous vein harvest from right thigh.) SURGEON: Salvatore Decent. Cornelius Moras, M.D. CHO/MEDQ D:06/08/05. T: 06/09/2005 Job: [id number]   THERAPEUTIC PHLEBOTOMY  03/21/2015  Family History  Problem Relation Age of Onset   Colon polyps Mother    Colon cancer Paternal Uncle    Heart attack Maternal Grandfather    Breast cancer Neg Hx    Prostate cancer Neg Hx    Esophageal cancer Neg Hx    Stomach cancer Neg Hx    Rectal cancer Neg Hx     Social History   Tobacco Use   Smoking status: Former    Current packs/day: 0.00    Average packs/day: 2.0 packs/day for 36.0 years (72.0 ttl pk-yrs)    Types: Cigarettes    Start date: 03/07/1970    Quit date: 03/07/2006    Years since quitting: 16.8   Smokeless tobacco: Never   Tobacco comments:     smoked since age 37 approx. 1 pact to 1 and a half packs a day. fortunately after his bypass surgery he completely discontinued smoking  Substance Use Topics   Alcohol use: Yes    Alcohol/week: 4.0 standard drinks of alcohol    Types: 4 Cans of beer per week    Comment: socially    Subjective:   Presents for yearly CPE; no acute concerns today; continuing to work with cardiology and hematology;  Would like to get flu shot updated today;   Review of Systems  Constitutional: Negative.   HENT: Negative.    Eyes: Negative.   Respiratory: Negative.    Cardiovascular: Negative.   Gastrointestinal: Negative.   Genitourinary: Negative.   Musculoskeletal: Negative.   Skin: Negative.   Neurological: Negative.   Endo/Heme/Allergies: Negative.   Psychiatric/Behavioral: Negative.        Objective:  Vitals:   01/25/23 1310  BP: 134/70  Pulse: 62  Resp: 20  Temp: 98.2 F (36.8 C)  TempSrc: Oral  SpO2: 98%  Weight: 236 lb 6.4 oz (107.2 kg)  Height: 5\' 10"  (1.778 m)    General: Well developed, well nourished, in no acute distress  Skin : Warm and dry.  Head: Normocephalic and atraumatic  Eyes: Sclera and conjunctiva clear; pupils round and reactive to light; extraocular movements intact  Ears: External normal; canals clear; tympanic membranes normal  Oropharynx: Pink, supple. No suspicious lesions  Neck: Supple without thyromegaly, adenopathy  Lungs: Respirations unlabored; clear to auscultation bilaterally without wheeze, rales, rhonchi  CVS exam: normal rate and regular rhythm.  Abdomen: Soft; nontender; nondistended; normoactive bowel sounds; no masses or hepatosplenomegaly  Musculoskeletal: No deformities; no active joint inflammation  Extremities: No edema, cyanosis, clubbing  Vessels: Symmetric bilaterally  Neurologic: Alert and oriented; speech intact; face symmetrical; moves all extremities well; CNII-XII intact without focal deficit   Assessment:  1. PE (physical  exam), annual   2. Encounter for colonoscopy due to history of adenomatous colonic polyps   3. Prostate cancer screening     Plan:  Age appropriate preventive healthcare needs addressed; encouraged regular eye doctor and dental exams; encouraged regular exercise; will update labs as needed today; continue with specialists as scheduled; order updated for screening colonoscopy ( 5 year);  Flu shot updated today; he will plan to do Shingrix at later date at his pharmacy;    No follow-ups on file.  Orders Placed This Encounter  Procedures   PSA   Ambulatory referral to Gastroenterology    Referral Priority:   Routine    Referral Type:   Consultation    Referral Reason:   Specialty Services Required    Referred to Provider:   Iva Boop, MD  Number of Visits Requested:   1    Requested Prescriptions    No prescriptions requested or ordered in this encounter

## 2023-01-26 LAB — PSA: PSA: 0.21 ng/mL (ref <=4.00)

## 2023-02-18 ENCOUNTER — Other Ambulatory Visit: Payer: Self-pay | Admitting: Internal Medicine

## 2023-03-07 ENCOUNTER — Inpatient Hospital Stay (HOSPITAL_BASED_OUTPATIENT_CLINIC_OR_DEPARTMENT_OTHER): Payer: 59 | Admitting: Nurse Practitioner

## 2023-03-07 ENCOUNTER — Inpatient Hospital Stay: Payer: 59 | Attending: Nurse Practitioner

## 2023-03-07 DIAGNOSIS — Z79899 Other long term (current) drug therapy: Secondary | ICD-10-CM | POA: Insufficient documentation

## 2023-03-07 LAB — CBC WITH DIFFERENTIAL (CANCER CENTER ONLY)
Abs Immature Granulocytes: 0.01 10*3/uL (ref 0.00–0.07)
Basophils Absolute: 0.1 10*3/uL (ref 0.0–0.1)
Basophils Relative: 1 %
Eosinophils Absolute: 0.2 10*3/uL (ref 0.0–0.5)
Eosinophils Relative: 4 %
HCT: 48 % (ref 39.0–52.0)
Hemoglobin: 16.8 g/dL (ref 13.0–17.0)
Immature Granulocytes: 0 %
Lymphocytes Relative: 31 %
Lymphs Abs: 1.6 10*3/uL (ref 0.7–4.0)
MCH: 33 pg (ref 26.0–34.0)
MCHC: 35 g/dL (ref 30.0–36.0)
MCV: 94.3 fL (ref 80.0–100.0)
Monocytes Absolute: 0.7 10*3/uL (ref 0.1–1.0)
Monocytes Relative: 14 %
Neutro Abs: 2.6 10*3/uL (ref 1.7–7.7)
Neutrophils Relative %: 50 %
Platelet Count: 159 10*3/uL (ref 150–400)
RBC: 5.09 MIL/uL (ref 4.22–5.81)
RDW: 12.3 % (ref 11.5–15.5)
WBC Count: 5.2 10*3/uL (ref 4.0–10.5)
nRBC: 0 % (ref 0.0–0.2)

## 2023-03-07 LAB — IRON AND IRON BINDING CAPACITY (CC-WL,HP ONLY)
Iron: 188 ug/dL — ABNORMAL HIGH (ref 45–182)
Saturation Ratios: 40 % — ABNORMAL HIGH (ref 17.9–39.5)
TIBC: 473 ug/dL — ABNORMAL HIGH (ref 250–450)
UIBC: 285 ug/dL (ref 117–376)

## 2023-03-07 LAB — FERRITIN: Ferritin: 32 ng/mL (ref 24–336)

## 2023-03-07 NOTE — Progress Notes (Addendum)
[facility] OFFICE PROGRESS NOTE  Dylan Bass, Dylan Hudson  ASSESSMENT & PLAN:  Hereditary hemochromatosis (HCC) Hereditary hemochromatosis. Labs have been stable for past year. Has not needed to have phlebotomy treatments in some time. Eating very little red meet. Not taking oral iron supplements. Feels well.  Labs reviewed  -CBC showing WBC 5.2; Hgb 16.8; Hct 48.0; Plt 159; Anc 2.6 -ferritin pending.  Labs have been stable for over a year. Will begin checking labs every 4 months.  Labs and follow up in 16 months.     The total time spent in the appointment was 20 minutes encounter with patients including review of chart and various tests results, discussions about plan of care and coordination of care plan   All questions were answered. The patient knows to call the clinic with any problems, questions or concerns. No barriers to learning was detected.    Carlean Jews, NP 10/18/202411:11 AM  INTERVAL HISTORY: Dylan Hudson returns for follow up as scheduled. Last seen by Clayborn Heron, NP 03/12/2022. He did not require phlebotomy last year.  He is doing well without changes in his overall health.  He feels tired, but nothing that prevents him from participating in his regular activities. No leg edema or calf pain.  Denies recent infection, bleeding, new cough, chest pain, shortness of breath, or any other new specific complaints. He states that age appropriate screenings are up to date. Continues to see cardiologist and primary care on routine basis.    I have reviewed the past medical history, past surgical history, social history and family history with the patient and they are unchanged from previous note.  ALLERGIES:  is allergic to niacin and related, pravachol [pravastatin], red yeast rice [cholestin], zetia [ezetimibe], and zocor [simvastatin].  MEDICATIONS:  Current Outpatient Medications  Medication Sig Dispense Refill   aspirin 81 MG tablet Take 1 tablet (81  mg total) by mouth daily.     Evolocumab (REPATHA SURECLICK) 140 MG/ML SOAJ INJECT THE CONTENTS OF ONE PEN INTO THE SKIN EVERY 14 DAYS 2 mL 11   fish oil-omega-3 fatty acids 1000 MG capsule Take 2 g by mouth 2 (two) times daily.     fluticasone (FLONASE) 50 MCG/ACT nasal spray USE 2 SPRAYS INTO THE NOSE DAILY 16 g 1   lisinopril (ZESTRIL) 20 MG tablet TAKE 1 TABLET BY MOUTH DAILY 90 tablet 1   loratadine (CLARITIN) 10 MG tablet Take 10 mg by mouth daily as needed. For allergies     metoprolol succinate (TOPROL-XL) 25 MG 24 hr tablet TAKE 1 TABLET BY MOUTH DAILY 90 tablet 1   pantoprazole (PROTONIX) 40 MG tablet TAKE 1 TABLET BY MOUTH DAILY WITH BREAKFAST **CALL FOR APPOINTMENT** 90 tablet 0   rosuvastatin (CRESTOR) 5 MG tablet TAKE 1 TABLET BY MOUTH DAILY ONE TO 3 TIMES A WEEK AS TOLERATED NEED APPOINTMENT FOR REFILLS 30 tablet 5   No current facility-administered medications for this visit.     REVIEW OF SYSTEMS:   Constitutional: Denies fevers, chills or night sweats Eyes: Denies blurriness of vision Ears, nose, mouth, throat, and face: Denies mucositis or sore throat Respiratory: Denies cough, dyspnea or wheezes Cardiovascular: Denies palpitation, chest discomfort or lower extremity swelling Gastrointestinal:  Denies nausea, heartburn or change in bowel habits Skin: Denies abnormal skin rashes Lymphatics: Denies new lymphadenopathy or easy bruising Neurological:Denies numbness, tingling or new weaknesses Behavioral/Psych: Mood is stable, no new changes  All other systems were reviewed with the patient and are  negative.  PHYSICAL EXAMINATION: ECOG PERFORMANCE STATUS: 0 - Asymptomatic  Vitals:   03/07/23 0958  BP: 112/73  Pulse: (!) 48  Resp: 17  Temp: 98 F (36.7 C)  SpO2: 99%   Filed Weights   03/07/23 0958  Weight: 237 lb (107.5 kg)    GENERAL:alert, no distress and comfortable SKIN: skin color, texture, turgor are normal, no rashes or significant lesions EYES:  normal, Conjunctiva are pink and non-injected, sclera clear OROPHARYNX:no exudate, no erythema and lips, buccal mucosa, and tongue normal  NECK: supple, thyroid normal size, non-tender, without nodularity LYMPH:  no palpable lymphadenopathy in the cervical, axillary or inguinal LUNGS: clear to auscultation and percussion with normal breathing effort HEART: regular rate & rhythm and no murmurs and no lower extremity edema ABDOMEN:abdomen soft, non-tender and normal bowel sounds Musculoskeletal:no cyanosis of digits and no clubbing  NEURO: alert & oriented x 3 with fluent speech, no focal motor/sensory deficits  LABORATORY DATA:  I have reviewed the data as listed     Component Value Date/Time   NA 135 05/08/2022 0834   NA 138 04/26/2021 0842   K 3.7 05/08/2022 0834   CL 104 05/08/2022 0834   CO2 23 05/08/2022 0834   GLUCOSE 106 (H) 05/08/2022 0834   BUN 13 05/08/2022 0834   BUN 15 04/26/2021 0842   CREATININE 0.89 05/08/2022 0834   CREATININE 1.01 11/29/2015 0857   CALCIUM 9.0 05/08/2022 0834   PROT 6.6 04/26/2021 0842   ALBUMIN 4.4 04/26/2021 0842   AST 33 04/26/2021 0842   ALT 53 (H) 04/26/2021 0842   ALKPHOS 50 04/26/2021 0842   BILITOT 1.6 (H) 04/26/2021 0842   GFRNONAA >60 05/08/2022 0834   GFRNONAA 82 11/29/2015 0857   GFRAA 90 02/22/2020 0832   GFRAA >89 11/29/2015 0857    Lab Results  Component Value Date   WBC 5.2 03/07/2023   NEUTROABS 2.6 03/07/2023   HGB 16.8 03/07/2023   HCT 48.0 03/07/2023   MCV 94.3 03/07/2023   PLT 159 03/07/2023    ADDENDUM I have seen the patient, examined him. I agree with the assessment and and plan and have edited the notes.   Dylan Hudson is clinically doing very well, he has not required phlebotomy for over a year.  Lab reviewed, his iron results are still pending.  Will call him if he needs a phlebotomy.  Will continue monitoring his iron level every 4 months, and see him back in 16 months.  Malachy Mood MD 03/07/2023

## 2023-03-07 NOTE — Assessment & Plan Note (Addendum)
Hereditary hemochromatosis. Labs have been stable for past year. Has not needed to have phlebotomy treatments in some time. Eating very little red meet. Not taking oral iron supplements. Feels well.  Labs reviewed  -CBC showing WBC 5.2; Hgb 16.8; Hct 48.0; Plt 159; Anc 2.6 -ferritin pending.  Labs have been stable for over a year. Will begin checking labs every 4 months.  Labs and follow up in 16 months.

## 2023-03-11 ENCOUNTER — Encounter: Payer: Self-pay | Admitting: Nurse Practitioner

## 2023-04-02 ENCOUNTER — Encounter: Payer: Self-pay | Admitting: Internal Medicine

## 2023-05-19 ENCOUNTER — Other Ambulatory Visit: Payer: Self-pay | Admitting: Internal Medicine

## 2023-06-22 ENCOUNTER — Other Ambulatory Visit: Payer: Self-pay | Admitting: Cardiology

## 2023-06-24 ENCOUNTER — Other Ambulatory Visit: Payer: Self-pay

## 2023-06-24 DIAGNOSIS — I2510 Atherosclerotic heart disease of native coronary artery without angina pectoris: Secondary | ICD-10-CM

## 2023-06-24 MED ORDER — METOPROLOL SUCCINATE ER 25 MG PO TB24: 25.0000 mg | ORAL_TABLET | Freq: Every day | ORAL | 3 refills | Status: DC

## 2023-07-09 ENCOUNTER — Telehealth: Payer: Self-pay | Admitting: Pharmacy Technician

## 2023-07-09 ENCOUNTER — Other Ambulatory Visit (HOSPITAL_COMMUNITY): Payer: Self-pay

## 2023-07-09 NOTE — Telephone Encounter (Signed)
 Pharmacy Patient Advocate Encounter   Received notification from CoverMyMeds that prior authorization for Repatha is required/requested.   Insurance verification completed.   The patient is insured through [facility] .   Per test claim: PA required; PA submitted to above mentioned insurance via CoverMyMeds Key/confirmation #/EOC [id number] Status is pending

## 2023-07-09 NOTE — Telephone Encounter (Signed)
 Pharmacy Patient Advocate Encounter  Received notification from [facility] that Prior Authorization for Repatha has been APPROVED from 07/09/23 to 07/08/24. Ran test claim, Copay is $40.00- one month. This test claim was processed through [facility]- copay amounts may vary at other pharmacies due to pharmacy/plan contracts, or as the patient moves through the different stages of their insurance plan.   PA #/Case ID/Reference #: [id number]

## 2023-07-10 ENCOUNTER — Inpatient Hospital Stay: Payer: 59 | Attending: Nurse Practitioner

## 2023-07-10 LAB — CBC WITH DIFFERENTIAL (CANCER CENTER ONLY)
Abs Immature Granulocytes: 0.02 10*3/uL (ref 0.00–0.07)
Basophils Absolute: 0.1 10*3/uL (ref 0.0–0.1)
Basophils Relative: 1 %
Eosinophils Absolute: 0.2 10*3/uL (ref 0.0–0.5)
Eosinophils Relative: 4 %
HCT: 47.1 % (ref 39.0–52.0)
Hemoglobin: 16.3 g/dL (ref 13.0–17.0)
Immature Granulocytes: 0 %
Lymphocytes Relative: 27 %
Lymphs Abs: 1.4 10*3/uL (ref 0.7–4.0)
MCH: 32.2 pg (ref 26.0–34.0)
MCHC: 34.6 g/dL (ref 30.0–36.0)
MCV: 93.1 fL (ref 80.0–100.0)
Monocytes Absolute: 0.9 10*3/uL (ref 0.1–1.0)
Monocytes Relative: 16 %
Neutro Abs: 2.7 10*3/uL (ref 1.7–7.7)
Neutrophils Relative %: 52 %
Platelet Count: 164 10*3/uL (ref 150–400)
RBC: 5.06 MIL/uL (ref 4.22–5.81)
RDW: 12.6 % (ref 11.5–15.5)
WBC Count: 5.3 10*3/uL (ref 4.0–10.5)
nRBC: 0 % (ref 0.0–0.2)

## 2023-07-10 LAB — FERRITIN: Ferritin: 41 ng/mL (ref 24–336)

## 2023-07-16 ENCOUNTER — Telehealth: Payer: Self-pay | Admitting: *Deleted

## 2023-07-16 NOTE — Telephone Encounter (Signed)
 Copied from CRM [id number]. Topic: Clinical - Medication Question >> Jul 16, 2023 12:39 PM Dylan Hudson wrote: Reason for CRM: *no question* Patient called in to provide pharmacy for medication tamiflu due to being positive for flu [address] [place] [address] [phone] / please call [phone]

## 2023-07-17 ENCOUNTER — Other Ambulatory Visit: Payer: Self-pay | Admitting: Family

## 2023-07-17 MED ORDER — OSELTAMIVIR PHOSPHATE 75 MG PO CAPS: 75.0000 mg | ORAL_CAPSULE | Freq: Two times a day (BID) | ORAL | 0 refills | Status: AC

## 2023-07-17 NOTE — Telephone Encounter (Signed)
 Pt is requesting Rx be sent to [facility] in [place]. [address] [address].

## 2023-07-17 NOTE — Telephone Encounter (Signed)
 Spoke with pt, pt states symptoms started 07/15/2023 and pt tested positive for flu. 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

## 2023-07-17 NOTE — Telephone Encounter (Signed)
 Spoke with pt, pt is aware and expressed understanding.

## 2023-07-18 ENCOUNTER — Other Ambulatory Visit: Payer: Self-pay

## 2023-07-18 MED ORDER — LISINOPRIL 20 MG PO TABS: 20.0000 mg | ORAL_TABLET | Freq: Every day | ORAL | 3 refills | Status: DC

## 2023-08-16 ENCOUNTER — Encounter: Payer: Self-pay | Admitting: Internal Medicine

## 2023-08-26 ENCOUNTER — Ambulatory Visit (AMBULATORY_SURGERY_CENTER)

## 2023-08-26 ENCOUNTER — Other Ambulatory Visit: Payer: Self-pay

## 2023-08-26 VITALS — Ht 70.0 in | Wt 228.0 lb

## 2023-08-26 DIAGNOSIS — Z860100 Personal history of colon polyps, unspecified: Secondary | ICD-10-CM

## 2023-08-26 MED ORDER — POLYETHYLENE GLYCOL 3350 17 GM/SCOOP PO POWD: 238.0000 g | Freq: Every day | ORAL | 3 refills | Status: DC

## 2023-08-26 NOTE — Progress Notes (Signed)
 Denies allergies to eggs or soy products. Denies complication of anesthesia or sedation. Denies use of weight loss medication. Denies use of O2.   Emmi instructions given for colonoscopy.

## 2023-08-27 ENCOUNTER — Other Ambulatory Visit: Payer: Self-pay | Admitting: Internal Medicine

## 2023-09-16 ENCOUNTER — Encounter: Payer: Self-pay | Admitting: Internal Medicine

## 2023-09-24 NOTE — Progress Notes (Unsigned)
 [facility] History and Physical   Primary Care Physician:  Adra Alanis, FNP   Reason for Procedure:   Hx colon polyps  Plan:    colonoscopy     HPI: Dylan Hudson is a 63 y.o. male w/ long hx colon polyps going back to 2003 and last exam 2019 had 3 diminutive adenomas  Two adenomas in 2003 (10 and 9 mm) One small adenoma 2007 Diminutive adenoma 2012 04/14/2018 3 diminutive polyps adenomas recall 2024 Past Medical History:  Diagnosis Date   Allergy    seasonal   Amenia    postoperative   Anemia    Benign positional vertigo    CAD (coronary artery disease)    s/p CABG 01/07   Diverticula of colon    Esophageal stricture 08/03/2013   GERD (gastroesophageal reflux disease)    Gilbert's syndrome    possible. (elevated bilirubin)   Hemochromatosis    Hx of adenomatous colonic polyps    Medoff   Hyperlipidemia    Hypertension    Internal hemorrhoid    Memory loss    Myocardial infarction (HCC) 05/2005   Tobacco abuse    Vertigo     Past Surgical History:  Procedure Laterality Date   APPENDECTOMY  2014   lap appy   COLONOSCOPY  2003, 2007, 02/2011   small adenomas   CORONARY ARTERY BYPASS GRAFT     ESOPHAGEAL DILATION  11/19/13   LAPAROSCOPIC APPENDECTOMY N/A 08/06/2012   Procedure: APPENDECTOMY LAPAROSCOPIC;  Surgeon: Kari Otto. Tsuei, MD;  Location: [facility] OR;  Service: General;  Laterality: N/A;   left knee arthroscopy     MEDIAN STERNOTOMY     for CABG x 4 (left internal mammary artery to distal left anterior descending coronary artery, right internal mammary artery to first circumflex marginal branch, saphenous vein graft to posterior descending coronary artery, saphenous vein graft to diagonal branch, endoscopic saphenous vein harvest from right thigh.) SURGEON: Mendel Stain. Alva Jewels, M.D. CHO/MEDQ D:06/08/05. T: 06/09/2005 Job: [id number]   THERAPEUTIC PHLEBOTOMY  03/21/2015        Prior to Admission medications   Medication Sig Start Date End  Date Taking? Authorizing Provider  aspirin  81 MG tablet Take 1 tablet (81 mg total) by mouth daily. 12/30/17   Lenise Quince, MD  Evolocumab  (REPATHA  SURECLICK) 140 MG/ML SOAJ INJECT THE CONTENTS OF ONE PEN INTO THE SKIN EVERY 14 DAYS 11/09/22   Lenise Quince, MD  fish oil-omega-3 fatty acids 1000 MG capsule Take 2 g by mouth 2 (two) times daily.    [provider]  fluticasone  (FLONASE ) 50 MCG/ACT nasal spray USE 2 SPRAYS INTO THE NOSE DAILY 09/02/12   Dorrene Gaucher, NP  lisinopril  (ZESTRIL ) 20 MG tablet Take 1 tablet (20 mg total) by mouth daily. 07/18/23   Lenise Quince, MD  loratadine (CLARITIN) 10 MG tablet Take 10 mg by mouth daily as needed. For allergies    [provider]  metoprolol  succinate (TOPROL -XL) 25 MG 24 hr tablet Take 1 tablet (25 mg total) by mouth daily. 06/24/23   Lenise Quince, MD  pantoprazole  (PROTONIX ) 40 MG tablet TAKE 1 TABLET BY MOUTH DAILY WITH BREAKFAST 08/27/23   Kenney Peacemaker, MD  polyethylene glycol powder (GLYCOLAX /MIRALAX ) 17 GM/SCOOP powder Take 238 g by mouth daily. 08/26/23   Kenney Peacemaker, MD  rosuvastatin  (CRESTOR ) 5 MG tablet TAKE 1 TABLET BY MOUTH DAILY ONE TO 3 TIMES A WEEK AS TOLERATED NEED APPOINTMENT FOR REFILLS 08/20/22  Lenise Quince, MD    Current Outpatient Medications  Medication Sig Dispense Refill   aspirin  81 MG tablet Take 1 tablet (81 mg total) by mouth daily.     fish oil-omega-3 fatty acids 1000 MG capsule Take 2 g by mouth 2 (two) times daily.     fluticasone  (FLONASE ) 50 MCG/ACT nasal spray USE 2 SPRAYS INTO THE NOSE DAILY 16 g 1   lisinopril  (ZESTRIL ) 20 MG tablet Take 1 tablet (20 mg total) by mouth daily. 30 tablet 3   metoprolol  succinate (TOPROL -XL) 25 MG 24 hr tablet Take 1 tablet (25 mg total) by mouth daily. 90 tablet 3   pantoprazole  (PROTONIX ) 40 MG tablet TAKE 1 TABLET BY MOUTH DAILY WITH BREAKFAST 90 tablet 0   Evolocumab  (REPATHA  SURECLICK) 140 MG/ML SOAJ INJECT THE CONTENTS OF ONE PEN  INTO THE SKIN EVERY 14 DAYS 2 mL 11   loratadine (CLARITIN) 10 MG tablet Take 10 mg by mouth daily as needed. For allergies     rosuvastatin  (CRESTOR ) 5 MG tablet TAKE 1 TABLET BY MOUTH DAILY ONE TO 3 TIMES A WEEK AS TOLERATED NEED APPOINTMENT FOR REFILLS 30 tablet 5   Current Facility-Administered Medications  Medication Dose Route Frequency Provider Last Rate Last Admin   0.9 %  sodium chloride  infusion  500 mL Intravenous Once Kenney Peacemaker, MD        Allergies as of 09/25/2023 - Review Complete 09/25/2023  Allergen Reaction Noted   Niacin and related Other (See Comments) 04/02/2014   Pravachol  [pravastatin ] Other (See Comments) 02/15/2014   Red yeast rice [cholestin] Other (See Comments) 11/24/2015   Zetia  [ezetimibe ] Other (See Comments) 05/11/2021   Zocor [simvastatin] Other (See Comments)     Family History  Problem Relation Age of Onset   Colon polyps Mother    Colon cancer Paternal Uncle    Heart attack Maternal Grandfather    Breast cancer Neg Hx    Prostate cancer Neg Hx    Esophageal cancer Neg Hx    Stomach cancer Neg Hx    Rectal cancer Neg Hx     Social History   Socioeconomic History   Marital status: Divorced    Spouse name: Not on file   Number of children: 1   Years of education: 12   Highest education level: Not on file  Occupational History    Comment: Software engineer   Occupation: SERVICE MANAGER    Employer: [employer]  Tobacco Use   Smoking status: Former    Current packs/day: 0.00    Average packs/day: 2.0 packs/day for 36.0 years (72.0 ttl pk-yrs)    Types: Cigarettes    Start date: 03/07/1970    Quit date: 03/07/2006    Years since quitting: 17.5   Smokeless tobacco: Never   Tobacco comments:    smoked since age 40 approx. 1 pact to 1 and a half packs a day. fortunately after his bypass surgery he completely discontinued smoking  Vaping Use   Vaping status: Never Used  Substance and Sexual Activity   Alcohol use:  Yes    Alcohol/week: 4.0 standard drinks of alcohol    Types: 4 Cans of beer per week    Comment: socially   Drug use: No   Sexual activity: Not on file  Other Topics Concern   Not on file  Social History Narrative   Lives with girlfriend. Fishing is a hobby.    Fun: Go to the lake every weekend.  Social Drivers of Corporate investment banker Strain: Not on file  Food Insecurity: Not on file  Transportation Needs: Not on file  Physical Activity: Not on file  Stress: Not on file  Social Connections: Not on file  Intimate Partner Violence: Not on file    Review of Systems:  All other review of systems negative except as mentioned in the HPI.  Physical Exam: Vital signs BP 124/73   Pulse (!) 58   Temp 97.9 F (36.6 C)   Ht 5\' 10"  (1.778 m)   Wt 228 lb (103.4 kg)   SpO2 96%   BMI 32.71 kg/m   General:   Alert,  Well-developed, well-nourished, pleasant and cooperative in NAD Lungs:  Clear throughout to auscultation.   Heart:  Regular rate and rhythm; no murmurs, clicks, rubs,  or gallops. Abdomen:  Soft, nontender and nondistended. Normal bowel sounds.   Neuro/Psych:  Alert and cooperative. Normal mood and affect. A and O x 3   @Dariel Pellecchia  Tammie Fall, MD, [facility] [phone] (pager) 09/25/2023 8:13 AM@

## 2023-09-25 ENCOUNTER — Ambulatory Visit (AMBULATORY_SURGERY_CENTER): Admitting: Internal Medicine

## 2023-09-25 ENCOUNTER — Encounter: Payer: Self-pay | Admitting: Internal Medicine

## 2023-09-25 VITALS — BP 116/69 | HR 60 | Temp 97.9°F | Resp 13 | Ht 70.0 in | Wt 228.0 lb

## 2023-09-25 DIAGNOSIS — K5730 Diverticulosis of large intestine without perforation or abscess without bleeding: Secondary | ICD-10-CM

## 2023-09-25 DIAGNOSIS — K644 Residual hemorrhoidal skin tags: Secondary | ICD-10-CM

## 2023-09-25 DIAGNOSIS — K648 Other hemorrhoids: Secondary | ICD-10-CM | POA: Diagnosis not present

## 2023-09-25 DIAGNOSIS — Z860100 Personal history of colon polyps, unspecified: Secondary | ICD-10-CM

## 2023-09-25 DIAGNOSIS — E785 Hyperlipidemia, unspecified: Secondary | ICD-10-CM | POA: Diagnosis not present

## 2023-09-25 DIAGNOSIS — Z1211 Encounter for screening for malignant neoplasm of colon: Secondary | ICD-10-CM | POA: Diagnosis not present

## 2023-09-25 DIAGNOSIS — Z860101 Personal history of adenomatous and serrated colon polyps: Secondary | ICD-10-CM | POA: Diagnosis not present

## 2023-09-25 DIAGNOSIS — I252 Old myocardial infarction: Secondary | ICD-10-CM | POA: Diagnosis not present

## 2023-09-25 MED ORDER — SODIUM CHLORIDE 0.9 % IV SOLN: 500.0000 mL | Freq: Once | INTRAVENOUS | Status: DC

## 2023-09-25 NOTE — Patient Instructions (Addendum)
 No polyps seen today.  Diverticulosis and hemorrhoids as before.  Next colonoscopy 7 years.  I appreciate the opportunity to care for you. Kenney Peacemaker, MD, [facility]   Discharge instructions given. Handouts on Diverticulosis and Hemorrhoids. Resume previous medications. YOU HAD AN ENDOSCOPIC PROCEDURE TODAY AT THE [facility]:   Refer to the procedure report that was given to you for any specific questions about what was found during the examination.  If the procedure report does not answer your questions, please call your gastroenterologist to clarify.  If you requested that your care partner not be given the details of your procedure findings, then the procedure report has been included in a sealed envelope for you to review at your convenience later.  YOU SHOULD EXPECT: Some feelings of bloating in the abdomen. Passage of more gas than usual.  Walking can help get rid of the air that was put into your GI tract during the procedure and reduce the bloating. If you had a lower endoscopy (such as a colonoscopy or flexible sigmoidoscopy) you may notice spotting of blood in your stool or on the toilet paper. If you underwent a bowel prep for your procedure, you may not have a normal bowel movement for a few days.  Please Note:  You might notice some irritation and congestion in your nose or some drainage.  This is from the oxygen used during your procedure.  There is no need for concern and it should clear up in a day or so.  SYMPTOMS TO REPORT IMMEDIATELY:  Following lower endoscopy (colonoscopy or flexible sigmoidoscopy):  Excessive amounts of blood in the stool  Significant tenderness or worsening of abdominal pains  Swelling of the abdomen that is new, acute  Fever of 100F or higher   For urgent or emergent issues, a gastroenterologist can be reached at any hour by calling [phone]. Do not use MyChart messaging for urgent concerns.    DIET:  We do recommend a small  meal at first, but then you may proceed to your regular diet.  Drink plenty of fluids but you should avoid alcoholic beverages for 24 hours.  ACTIVITY:  You should plan to take it easy for the rest of today and you should NOT DRIVE or use heavy machinery until tomorrow (because of the sedation medicines used during the test).    FOLLOW UP: Our staff will call the number listed on your records the next business day following your procedure.  We will call around 7:15- 8:00 am to check on you and address any questions or concerns that you may have regarding the information given to you following your procedure. If we do not reach you, we will leave a message.     If any biopsies were taken you will be contacted by phone or by letter within the next 1-3 weeks.  Please call us  at [phone] if you have not heard about the biopsies in 3 weeks.    SIGNATURES/CONFIDENTIALITY: You and/or your care partner have signed paperwork which will be entered into your electronic medical record.  These signatures attest to the fact that that the information above on your After Visit Summary has been reviewed and is understood.  Full responsibility of the confidentiality of this discharge information lies with you and/or your care-partner.

## 2023-09-25 NOTE — Op Note (Signed)
 [facility] Patient Name: Dylan Hudson Procedure Date: 09/25/2023 8:11 AM MRN: [id number] Endoscopist: Kenney Peacemaker , MD, [id number] Age: 63 Referring MD:  Date of Birth: 1960/11/14 Gender: Male Account #: [id number] Procedure:                Colonoscopy Indications:              Surveillance: Personal history of adenomatous                            polyps on last colonoscopy > 5 years ago, Last                            colonoscopy: 2019 Medicines:                Monitored Anesthesia Care Procedure:                Pre-Anesthesia Assessment:                           - Prior to the procedure, a History and Physical                            was performed, and patient medications and                            allergies were reviewed. The patient's tolerance of                            previous anesthesia was also reviewed. The risks                            and benefits of the procedure and the sedation                            options and risks were discussed with the patient.                            All questions were answered, and informed consent                            was obtained. Prior Anticoagulants: The patient has                            taken no anticoagulant or antiplatelet agents. ASA                            Grade Assessment: III - A patient with severe                            systemic disease. After reviewing the risks and                            benefits, the patient was deemed in satisfactory  condition to undergo the procedure.                           After obtaining informed consent, the colonoscope                            was passed under direct vision. Throughout the                            procedure, the patient's blood pressure, pulse, and                            oxygen saturations were monitored continuously. The                            CF [id number] #[id number] was introduced through the  anus                            and advanced to the the cecum, identified by                            appendiceal orifice and ileocecal valve. The                            colonoscopy was performed without difficulty. The                            patient tolerated the procedure well. The quality                            of the bowel preparation was good. The ileocecal                            valve, appendiceal orifice, and rectum were                            photographed. The bowel preparation used was                            Miralax  via split dose instruction. Scope In: 8:21:05 AM Scope Out: 8:33:43 AM Scope Withdrawal Time: 0 hours 11 minutes 4 seconds  Total Procedure Duration: 0 hours 12 minutes 38 seconds  Findings:                 Hemorrhoids were found on perianal exam.                           The digital rectal exam was normal. Pertinent                            negatives include normal prostate (size, shape, and                            consistency).  Multiple diverticula were found in the ascending                            colon.                           External and internal hemorrhoids were found during                            retroflexion.                           The exam was otherwise without abnormality on                            direct and retroflexion views. Complications:            No immediate complications. Estimated Blood Loss:     Estimated blood loss: none. Impression:               - Hemorrhoids found on perianal exam.                           - Diverticulosis in the ascending colon.                           - External and internal hemorrhoids.                           - The examination was otherwise normal on direct                            and retroflexion views.                           - No specimens collected.                           - Personal history of colonic polyps.                            Two adenomas in 2003 (10 and 9 mm)                           One small adenoma 2007                           Diminutive adenoma 2012                           04/14/2018 3 diminutive polyps adenomas Recommendation:           - Patient has a contact number available for                            emergencies. The signs and symptoms of potential  delayed complications were discussed with the                            patient. Return to normal activities tomorrow.                            Written discharge instructions were provided to the                            patient.                           - Resume previous diet.                           - Continue present medications.                           - Repeat colonoscopy in 7 years for surveillance. Kenney Peacemaker, MD 09/25/2023 8:41:53 AM This report has been signed electronically.

## 2023-09-25 NOTE — Progress Notes (Signed)
 Vss nad trans to pacu

## 2023-09-25 NOTE — Progress Notes (Signed)
 Pt's states no medical or surgical changes since previsit or office visit.

## 2023-09-26 ENCOUNTER — Telehealth: Payer: Self-pay

## 2023-09-26 NOTE — Telephone Encounter (Signed)
 Left message on follow up call.

## 2023-10-11 ENCOUNTER — Other Ambulatory Visit: Payer: Self-pay | Admitting: Cardiology

## 2023-10-15 ENCOUNTER — Other Ambulatory Visit: Payer: Self-pay | Admitting: Cardiology

## 2023-10-25 ENCOUNTER — Ambulatory Visit: Payer: 59 | Admitting: Cardiology

## 2023-11-06 ENCOUNTER — Inpatient Hospital Stay: Payer: 59 | Attending: Nurse Practitioner

## 2023-11-06 LAB — CBC WITH DIFFERENTIAL (CANCER CENTER ONLY)
Abs Immature Granulocytes: 0.01 10*3/uL (ref 0.00–0.07)
Basophils Absolute: 0.1 10*3/uL (ref 0.0–0.1)
Basophils Relative: 1 %
Eosinophils Absolute: 0.2 10*3/uL (ref 0.0–0.5)
Eosinophils Relative: 4 %
HCT: 46.5 % (ref 39.0–52.0)
Hemoglobin: 16.4 g/dL (ref 13.0–17.0)
Immature Granulocytes: 0 %
Lymphocytes Relative: 30 %
Lymphs Abs: 1.4 10*3/uL (ref 0.7–4.0)
MCH: 32 pg (ref 26.0–34.0)
MCHC: 35.3 g/dL (ref 30.0–36.0)
MCV: 90.8 fL (ref 80.0–100.0)
Monocytes Absolute: 0.7 10*3/uL (ref 0.1–1.0)
Monocytes Relative: 16 %
Neutro Abs: 2.2 10*3/uL (ref 1.7–7.7)
Neutrophils Relative %: 49 %
Platelet Count: 157 10*3/uL (ref 150–400)
RBC: 5.12 MIL/uL (ref 4.22–5.81)
RDW: 12.6 % (ref 11.5–15.5)
WBC Count: 4.5 10*3/uL (ref 4.0–10.5)
nRBC: 0 % (ref 0.0–0.2)

## 2023-11-06 LAB — FERRITIN: Ferritin: 60 ng/mL (ref 24–336)

## 2023-11-12 NOTE — Progress Notes (Deleted)
 HPI: FU coronary artery disease. In 2007, the patient had cardiac catheterization that showed an ejection fraction of 45% with severe anterior apical hypokinesis and severe coronary disease. He ultimately underwent coronary artery bypassing graft on June 04, 2005. At that time, he had a saphenous vein graft to the PDA, status vein graft to the diagonal, a RIMA to the OM1, and a LIMA to the LAD. Myoview  in March of 2013 showed an ejection fraction of 68%. There was prior infarct but no ischemia. Also with h/o hemchromatosis. Echo 10/21 showed normal LV function, moderate RAE, and mildly dilated ascending aorta. Since I last saw him,   Current Outpatient Medications  Medication Sig Dispense Refill   aspirin  81 MG tablet Take 1 tablet (81 mg total) by mouth daily.     Evolocumab  (REPATHA  SURECLICK) 140 MG/ML SOAJ INJECT THE CONTENTS OF ONE PEN INTO THE SKIN EVERY 14 DAYS 2 mL 11   fish oil-omega-3 fatty acids 1000 MG capsule Take 2 g by mouth 2 (two) times daily.     fluticasone  (FLONASE ) 50 MCG/ACT nasal spray USE 2 SPRAYS INTO THE NOSE DAILY 16 g 1   lisinopril  (ZESTRIL ) 20 MG tablet TAKE 1 TABLET BY MOUTH DAILY 90 tablet 3   loratadine (CLARITIN) 10 MG tablet Take 10 mg by mouth daily as needed. For allergies     metoprolol  succinate (TOPROL -XL) 25 MG 24 hr tablet Take 1 tablet (25 mg total) by mouth daily. 90 tablet 3   pantoprazole  (PROTONIX ) 40 MG tablet TAKE 1 TABLET BY MOUTH DAILY WITH BREAKFAST 90 tablet 0   rosuvastatin  (CRESTOR ) 5 MG tablet TAKE 1 TABLET BY MOUTH DAILY TAKING ONCE TO 3 TIMES A WEEK AS TOLERATED NEED APPOINTMENT FOR REFILLS 15 tablet 0   No current facility-administered medications for this visit.     Past Medical History:  Diagnosis Date   Allergy    seasonal   Amenia    postoperative   Anemia    Benign positional vertigo    CAD (coronary artery disease)    s/p CABG 01/07   Diverticula of colon    Esophageal stricture 08/03/2013   GERD  (gastroesophageal reflux disease)    Gilbert's syndrome    possible. (elevated bilirubin)   Hemochromatosis    Hx of adenomatous colonic polyps    Medoff   Hyperlipidemia    Hypertension    Internal hemorrhoid    Memory loss    Myocardial infarction (HCC) 05/2005   Tobacco abuse    Vertigo     Past Surgical History:  Procedure Laterality Date   APPENDECTOMY  2014   lap appy   COLONOSCOPY  2003, 2007, 02/2011   small adenomas   CORONARY ARTERY BYPASS GRAFT     ESOPHAGEAL DILATION  11/19/13   LAPAROSCOPIC APPENDECTOMY N/A 08/06/2012   Procedure: APPENDECTOMY LAPAROSCOPIC;  Surgeon: Donnice POUR. Tsuei, MD;  Location: [facility] OR;  Service: General;  Laterality: N/A;   left knee arthroscopy     MEDIAN STERNOTOMY     for CABG x 4 (left internal mammary artery to distal left anterior descending coronary artery, right internal mammary artery to first circumflex marginal branch, saphenous vein graft to posterior descending coronary artery, saphenous vein graft to diagonal branch, endoscopic saphenous vein harvest from right thigh.) SURGEON: Sudie DEL. Dusty, M.D. CHO/MEDQ D:06/08/05. T: 06/09/2005 Job: [id number]   THERAPEUTIC PHLEBOTOMY  03/21/2015        Social History   Socioeconomic History   Marital status:  Divorced    Spouse name: Not on file   Number of children: 1   Years of education: 55   Highest education level: Not on file  Occupational History    Comment: Software engineer   Occupation: SERVICE MANAGER    Employer: [employer]  Tobacco Use   Smoking status: Former    Current packs/day: 0.00    Average packs/day: 2.0 packs/day for 36.0 years (72.0 ttl pk-yrs)    Types: Cigarettes    Start date: 03/07/1970    Quit date: 03/07/2006    Years since quitting: 17.6   Smokeless tobacco: Never   Tobacco comments:    smoked since age 49 approx. 1 pact to 1 and a half packs a day. fortunately after his bypass surgery he completely discontinued smoking  Vaping Use    Vaping status: Never Used  Substance and Sexual Activity   Alcohol use: Yes    Alcohol/week: 4.0 standard drinks of alcohol    Types: 4 Cans of beer per week    Comment: socially   Drug use: No   Sexual activity: Not on file  Other Topics Concern   Not on file  Social History Narrative   Lives with girlfriend. Fishing is a hobby.    Fun: Go to the lake every weekend.    Social Drivers of Corporate investment banker Strain: Not on file  Food Insecurity: Not on file  Transportation Needs: Not on file  Physical Activity: Not on file  Stress: Not on file  Social Connections: Not on file  Intimate Partner Violence: Not on file    Family History  Problem Relation Age of Onset   Colon polyps Mother    Colon cancer Paternal Uncle    Heart attack Maternal Grandfather    Breast cancer Neg Hx    Prostate cancer Neg Hx    Esophageal cancer Neg Hx    Stomach cancer Neg Hx    Rectal cancer Neg Hx     ROS: no fevers or chills, productive cough, hemoptysis, dysphasia, odynophagia, melena, hematochezia, dysuria, hematuria, rash, seizure activity, orthopnea, PND, pedal edema, claudication. Remaining systems are negative.  Physical Exam: Well-developed well-nourished in no acute distress.  Skin is warm and dry.  HEENT is normal.  Neck is supple.  Chest is clear to auscultation with normal expansion.  Cardiovascular exam is regular rate and rhythm.  Abdominal exam nontender or distended. No masses palpated. Extremities show no edema. neuro grossly intact  ECG- personally reviewed  A/P  1 coronary artery disease status post coronary bypass graft-patient denies chest pain.  Continue aspirin  and statin.  2 hyperlipidemia-continue present dose of Crestor  (he did not tolerate higher doses previously) and Repatha .  3 hypertension-blood pressure controlled.  Continue present medical regimen.  Redell Shallow, MD

## 2023-11-14 ENCOUNTER — Ambulatory Visit: Payer: Self-pay | Admitting: Nurse Practitioner

## 2023-11-14 ENCOUNTER — Other Ambulatory Visit: Payer: Self-pay | Admitting: Cardiology

## 2023-11-19 ENCOUNTER — Ambulatory Visit: Attending: Cardiology | Admitting: Cardiology

## 2023-11-25 ENCOUNTER — Other Ambulatory Visit: Payer: Self-pay | Admitting: Internal Medicine

## 2023-11-25 ENCOUNTER — Other Ambulatory Visit: Payer: Self-pay | Admitting: Cardiology

## 2023-12-27 ENCOUNTER — Other Ambulatory Visit: Payer: Self-pay | Admitting: Cardiology

## 2023-12-27 ENCOUNTER — Other Ambulatory Visit (HOSPITAL_COMMUNITY): Payer: Self-pay

## 2023-12-27 ENCOUNTER — Telehealth: Payer: Self-pay | Admitting: Pharmacy Technician

## 2023-12-27 NOTE — Telephone Encounter (Signed)
 Pt has upcoming appointment in October 2025 with Dr. Pietro. Do you want a hepatic panel and lipid panel placed?

## 2023-12-27 NOTE — Telephone Encounter (Signed)
 Hi, the insurance is asking for labs from within 120 days; the last labs I can find are from prior to that. Can he have updated labs for this prior authorization? Thank you!   Pharmacy Patient Advocate Encounter   Received notification from CoverMyMeds that prior authorization for repatha  is required/requested.   Insurance verification completed.   The patient is insured through [place] .   Per test claim: PA required; PA submitted to above mentioned insurance via latent Key/confirmation #/EOC [facility] Status is pending

## 2024-01-01 ENCOUNTER — Other Ambulatory Visit: Payer: Self-pay | Admitting: *Deleted

## 2024-01-01 DIAGNOSIS — E785 Hyperlipidemia, unspecified: Secondary | ICD-10-CM

## 2024-01-01 NOTE — Telephone Encounter (Signed)
 Lab orders mailed to the pt.

## 2024-01-12 DIAGNOSIS — M25552 Pain in left hip: Secondary | ICD-10-CM | POA: Diagnosis not present

## 2024-01-12 DIAGNOSIS — M5136 Other intervertebral disc degeneration, lumbar region with discogenic back pain only: Secondary | ICD-10-CM | POA: Diagnosis not present

## 2024-01-21 DIAGNOSIS — M545 Low back pain, unspecified: Secondary | ICD-10-CM | POA: Diagnosis not present

## 2024-01-21 DIAGNOSIS — M25552 Pain in left hip: Secondary | ICD-10-CM | POA: Diagnosis not present

## 2024-02-09 NOTE — Progress Notes (Addendum)
 HPI: FU coronary artery disease. In 2007, the patient had cardiac catheterization that showed an ejection fraction of 45% with severe anterior apical hypokinesis and severe coronary disease. He ultimately underwent coronary artery bypassing graft on June 04, 2005. At that time, he had a saphenous vein graft to the PDA, status vein graft to the diagonal, a RIMA to the OM1, and a LIMA to the LAD. Myoview  in March of 2013 showed an ejection fraction of 68%. There was prior infarct but no ischemia. Also with h/o hemchromatosis. Echo 10/21 showed normal LV function, moderate RAE, and mildly dilated ascending aorta. Since I last saw him, the patient denies any dyspnea on exertion, orthopnea, PND, pedal edema, palpitations, syncope or chest pain.   Current Outpatient Medications  Medication Sig Dispense Refill   aspirin  81 MG tablet Take 1 tablet (81 mg total) by mouth daily.     Evolocumab  (REPATHA  SURECLICK) 140 MG/ML SOAJ INJECT ONE MILLILITER INTO THE SKIN EVERY 14 DAYS 6 mL 0   fish oil-omega-3 fatty acids 1000 MG capsule Take 2 g by mouth 2 (two) times daily.     fluticasone  (FLONASE ) 50 MCG/ACT nasal spray USE 2 SPRAYS INTO THE NOSE DAILY 16 g 1   lisinopril  (ZESTRIL ) 20 MG tablet TAKE 1 TABLET BY MOUTH DAILY 90 tablet 3   loratadine (CLARITIN) 10 MG tablet Take 10 mg by mouth daily as needed. For allergies     metoprolol  succinate (TOPROL -XL) 25 MG 24 hr tablet Take 1 tablet (25 mg total) by mouth daily. 90 tablet 3   pantoprazole  (PROTONIX ) 40 MG tablet TAKE 1 TABLET BY MOUTH DAILY WITH BREAKFAST 90 tablet 3   rosuvastatin  (CRESTOR ) 5 MG tablet TAKE 1 TABLET BY MOUTH ONCE TO 3 TIMES A WEEK AS TOLERATED NEED APPOINTMENT FOR REFILLS 12 tablet 1   No current facility-administered medications for this visit.     Past Medical History:  Diagnosis Date   Allergy    seasonal   Amenia    postoperative   Anemia    Benign positional vertigo    CAD (coronary artery disease)    s/p CABG  01/07   Diverticula of colon    Esophageal stricture 08/03/2013   GERD (gastroesophageal reflux disease)    Gilbert's syndrome    possible. (elevated bilirubin)   Hemochromatosis    Hx of adenomatous colonic polyps    Medoff   Hyperlipidemia    Hypertension    Internal hemorrhoid    Memory loss    Myocardial infarction (HCC) 05/2005   Tobacco abuse    Vertigo     Past Surgical History:  Procedure Laterality Date   APPENDECTOMY  2014   lap appy   COLONOSCOPY  2003, 2007, 02/2011   small adenomas   CORONARY ARTERY BYPASS GRAFT     ESOPHAGEAL DILATION  11/19/13   LAPAROSCOPIC APPENDECTOMY N/A 08/06/2012   Procedure: APPENDECTOMY LAPAROSCOPIC;  Surgeon: Donnice POUR. Tsuei, MD;  Location: [facility] OR;  Service: General;  Laterality: N/A;   left knee arthroscopy     MEDIAN STERNOTOMY     for CABG x 4 (left internal mammary artery to distal left anterior descending coronary artery, right internal mammary artery to first circumflex marginal branch, saphenous vein graft to posterior descending coronary artery, saphenous vein graft to diagonal branch, endoscopic saphenous vein harvest from right thigh.) SURGEON: Sudie DEL. Dusty, M.D. CHO/MEDQ D:06/08/05. T: 06/09/2005 Job: [id number]   THERAPEUTIC PHLEBOTOMY  03/21/2015  Social History   Socioeconomic History   Marital status: Divorced    Spouse name: Not on file   Number of children: 1   Years of education: 12   Highest education level: Not on file  Occupational History    Comment: Software engineer   Occupation: SERVICE MANAGER    Employer: [employer]  Tobacco Use   Smoking status: Former    Current packs/day: 0.00    Average packs/day: 2.0 packs/day for 36.0 years (72.0 ttl pk-yrs)    Types: Cigarettes    Start date: 03/07/1970    Quit date: 03/07/2006    Years since quitting: 17.9   Smokeless tobacco: Never   Tobacco comments:    smoked since age 17 approx. 1 pact to 1 and a half packs a day. fortunately after  his bypass surgery he completely discontinued smoking  Vaping Use   Vaping status: Never Used  Substance and Sexual Activity   Alcohol use: Yes    Alcohol/week: 4.0 standard drinks of alcohol    Types: 4 Cans of beer per week    Comment: socially   Drug use: No   Sexual activity: Not on file  Other Topics Concern   Not on file  Social History Narrative   Lives with girlfriend. Fishing is a hobby.    Fun: Go to the lake every weekend.    Social Drivers of Corporate investment banker Strain: Not on file  Food Insecurity: Not on file  Transportation Needs: Not on file  Physical Activity: Not on file  Stress: Not on file  Social Connections: Not on file  Intimate Partner Violence: Not on file    Family History  Problem Relation Age of Onset   Colon polyps Mother    Colon cancer Paternal Uncle    Heart attack Maternal Grandfather    Breast cancer Neg Hx    Prostate cancer Neg Hx    Esophageal cancer Neg Hx    Stomach cancer Neg Hx    Rectal cancer Neg Hx     ROS: no fevers or chills, productive cough, hemoptysis, dysphasia, odynophagia, melena, hematochezia, dysuria, hematuria, rash, seizure activity, orthopnea, PND, pedal edema, claudication. Remaining systems are negative.  Physical Exam: Well-developed well-nourished in no acute distress.  Skin is warm and dry.  HEENT is normal.  Neck is supple.  Chest is clear to auscultation with normal expansion.  Cardiovascular exam is regular rate and rhythm.  Abdominal exam nontender or distended. No masses palpated.  Positive bruit Extremities show no edema. neuro grossly intact  EKG Interpretation Date/Time:  Thursday February 20 2024 08:51:27 EDT Ventricular Rate:  57 PR Interval:  164 QRS Duration:  90 QT Interval:  420 QTC Calculation: 408 R Axis:   16  Text Interpretation: Sinus bradycardia Low voltage QRS Cannot rule out Anterior infarct , age undetermined Confirmed by Pietro Rogue ([id number]) on 02/20/2024 8:52:07  AM    A/P  1 coronary artery disease status post coronary artery bypass and graft-patient denies chest pain.  Continue aspirin  and statin.  2 hypertension-blood pressure controlled.  Continue present medical regimen.  3 hyperlipidemia-recently having difficulty obtaining his Repatha .  Will continue Crestor  and ask pharmacy to help with his Repatha  prescription.  Once it is resumed we will check lipids, liver and bmet 8 weeks later.  4 history of hemochromatosis-per hematology.  5 bruit-schedule abdominal ultrasound to exclude aneurysm.  Rogue Pietro, MD

## 2024-02-20 ENCOUNTER — Encounter: Payer: Self-pay | Admitting: Cardiology

## 2024-02-20 ENCOUNTER — Ambulatory Visit: Attending: Cardiology | Admitting: Cardiology

## 2024-02-20 VITALS — BP 116/77 | HR 55 | Ht 71.0 in | Wt 227.7 lb

## 2024-02-20 DIAGNOSIS — Z136 Encounter for screening for cardiovascular disorders: Secondary | ICD-10-CM | POA: Diagnosis not present

## 2024-02-20 DIAGNOSIS — E785 Hyperlipidemia, unspecified: Secondary | ICD-10-CM

## 2024-02-20 DIAGNOSIS — R0989 Other specified symptoms and signs involving the circulatory and respiratory systems: Secondary | ICD-10-CM

## 2024-02-20 DIAGNOSIS — I2510 Atherosclerotic heart disease of native coronary artery without angina pectoris: Secondary | ICD-10-CM | POA: Diagnosis not present

## 2024-02-20 DIAGNOSIS — I10 Essential (primary) hypertension: Secondary | ICD-10-CM

## 2024-02-20 NOTE — Patient Instructions (Signed)
   Lab Work:  Your physician recommends that you return for lab work in:6 WEEKS AFTER RESTARTING REPATHA -FASTING  If you have labs (blood work) drawn today and your tests are completely normal, you will receive your results only by: MyChart Message (if you have MyChart) OR A paper copy in the mail If you have any lab test that is abnormal or we need to change your treatment, we will call you to review the results.  Testing/Procedures:  Your physician has requested that you have an abdominal aorta duplex. During this test, an ultrasound is used to evaluate the aorta. Allow 30 minutes for this exam. Do not eat after midnight the day before and avoid carbonated beverages.  Please note: We ask at that you not bring children with you during ultrasound (echo/ vascular) testing. Due to room size and safety concerns, children are not allowed in the ultrasound rooms during exams. Our front office staff cannot provide observation of children in our lobby area while testing is being conducted. An adult accompanying a patient to their appointment will only be allowed in the ultrasound room at the discretion of the ultrasound technician under special circumstances. We apologize for any inconvenience. [address]  Follow-Up: At [facility], you and your health needs are our priority.  As part of our continuing mission to provide you with exceptional heart care, our providers are all part of one team.  This team includes your primary Cardiologist (physician) and Advanced Practice Providers or APPs (Physician Assistants and Nurse Practitioners) who all work together to provide you with the care you need, when you need it.  Your next appointment:   12 month(s)  Provider:   Redell Shallow, MD

## 2024-02-20 NOTE — Telephone Encounter (Signed)
 Patient is here seeing Dylan Hudson and he has not been taking repatha . Do I still do the labs for insurance?

## 2024-02-20 NOTE — Addendum Note (Signed)
 Addended by: RICHIE ADRIEN ORN on: 02/20/2024 08:49 AM   Modules accepted: Orders

## 2024-02-20 NOTE — Telephone Encounter (Signed)
 He needs to restart, do I just send the script in?

## 2024-02-21 ENCOUNTER — Other Ambulatory Visit: Payer: Self-pay | Admitting: Cardiology

## 2024-02-21 ENCOUNTER — Ambulatory Visit: Payer: Self-pay | Admitting: Cardiology

## 2024-02-21 DIAGNOSIS — E785 Hyperlipidemia, unspecified: Secondary | ICD-10-CM

## 2024-02-21 LAB — COMPREHENSIVE METABOLIC PANEL WITH GFR
ALT: 52 IU/L — ABNORMAL HIGH (ref 0–44)
AST: 32 IU/L (ref 0–40)
Albumin: 4.6 g/dL (ref 3.9–4.9)
Alkaline Phosphatase: 59 IU/L (ref 47–123)
BUN/Creatinine Ratio: 17 (ref 10–24)
BUN: 18 mg/dL (ref 8–27)
Bilirubin Total: 1.9 mg/dL — ABNORMAL HIGH (ref 0.0–1.2)
CO2: 23 mmol/L (ref 20–29)
Calcium: 9.4 mg/dL (ref 8.6–10.2)
Chloride: 97 mmol/L (ref 96–106)
Creatinine, Ser: 1.04 mg/dL (ref 0.76–1.27)
Globulin, Total: 1.9 g/dL (ref 1.5–4.5)
Glucose: 69 mg/dL — ABNORMAL LOW (ref 70–99)
Potassium: 3.7 mmol/L (ref 3.5–5.2)
Sodium: 136 mmol/L (ref 134–144)
Total Protein: 6.5 g/dL (ref 6.0–8.5)
eGFR: 80 mL/min/1.73 (ref >59)

## 2024-02-21 LAB — LIPID PANEL
Chol/HDL Ratio: 5.3 ratio — ABNORMAL HIGH (ref 0.0–5.0)
Cholesterol, Total: 211 mg/dL — ABNORMAL HIGH (ref 100–199)
HDL: 40 mg/dL (ref >39)
LDL Chol Calc (NIH): 122 mg/dL — ABNORMAL HIGH (ref 0–99)
Triglycerides: 277 mg/dL — ABNORMAL HIGH (ref 0–149)
VLDL Cholesterol Cal: 49 mg/dL — ABNORMAL HIGH (ref 5–40)

## 2024-02-21 MED ORDER — REPATHA SURECLICK 140 MG/ML ~~LOC~~ SOAJ: SUBCUTANEOUS | 0 refills | Status: DC

## 2024-03-06 ENCOUNTER — Ambulatory Visit (HOSPITAL_COMMUNITY)
Admission: RE | Admit: 2024-03-06 | Discharge: 2024-03-06 | Disposition: A | Discharge status: Home / Self Care | Source: Ambulatory Visit | Attending: Cardiology | Admitting: Cardiology

## 2024-03-06 DIAGNOSIS — Z87891 Personal history of nicotine dependence: Secondary | ICD-10-CM | POA: Diagnosis not present

## 2024-03-06 DIAGNOSIS — R0989 Other specified symptoms and signs involving the circulatory and respiratory systems: Secondary | ICD-10-CM | POA: Insufficient documentation

## 2024-03-06 DIAGNOSIS — Z136 Encounter for screening for cardiovascular disorders: Secondary | ICD-10-CM | POA: Insufficient documentation

## 2024-03-09 ENCOUNTER — Telehealth: Payer: Self-pay | Admitting: Pharmacy Technician

## 2024-03-09 ENCOUNTER — Other Ambulatory Visit (HOSPITAL_COMMUNITY): Payer: Self-pay

## 2024-03-09 NOTE — Telephone Encounter (Signed)
 Pharmacy Patient Advocate Encounter   Received notification from CoverMyMeds that prior authorization for repatha  is required/requested.   Insurance verification completed.   The patient is insured through [place].   Per test claim: PA required; PA submitted to above mentioned insurance via Latent Key/confirmation #/EOC [id number] Status is pending

## 2024-03-09 NOTE — Telephone Encounter (Signed)
 Pharmacy Patient Advocate Encounter  Received notification from [facility] that Prior Authorization for Repatha  has been APPROVED from 03/09/24 to 09/07/24. Ran test claim, Copay is $141.00- 3 months. This test claim was processed through [facility]- copay amounts may vary at other pharmacies due to pharmacy/plan contracts, or as the patient moves through the different stages of their insurance plan.   PA #/Case ID/Reference #: [id number]

## 2024-03-11 ENCOUNTER — Inpatient Hospital Stay: Payer: 59 | Attending: Nurse Practitioner

## 2024-03-11 LAB — CBC WITH DIFFERENTIAL (CANCER CENTER ONLY)
Abs Immature Granulocytes: 0.01 K/uL (ref 0.00–0.07)
Basophils Absolute: 0.1 K/uL (ref 0.0–0.1)
Basophils Relative: 1 %
Eosinophils Absolute: 0.1 K/uL (ref 0.0–0.5)
Eosinophils Relative: 1 %
HCT: 44.7 % (ref 39.0–52.0)
Hemoglobin: 15.8 g/dL (ref 13.0–17.0)
Immature Granulocytes: 0 %
Lymphocytes Relative: 26 %
Lymphs Abs: 1.4 K/uL (ref 0.7–4.0)
MCH: 33.3 pg (ref 26.0–34.0)
MCHC: 35.3 g/dL (ref 30.0–36.0)
MCV: 94.3 fL (ref 80.0–100.0)
Monocytes Absolute: 0.7 K/uL (ref 0.1–1.0)
Monocytes Relative: 12 %
Neutro Abs: 3.2 K/uL (ref 1.7–7.7)
Neutrophils Relative %: 60 %
Platelet Count: 166 K/uL (ref 150–400)
RBC: 4.74 MIL/uL (ref 4.22–5.81)
RDW: 13.8 % (ref 11.5–15.5)
WBC Count: 5.4 K/uL (ref 4.0–10.5)
nRBC: 0 % (ref 0.0–0.2)

## 2024-03-11 LAB — FERRITIN: Ferritin: 103 ng/mL (ref 24–336)

## 2024-03-24 ENCOUNTER — Telehealth: Payer: Self-pay

## 2024-03-24 NOTE — Telephone Encounter (Signed)
 Patient is overdue for an appointment with PCP. LMOM for patient to call and schedule.

## 2024-04-15 ENCOUNTER — Encounter: Payer: Self-pay | Admitting: Student

## 2024-04-15 ENCOUNTER — Ambulatory Visit: Admitting: Student

## 2024-04-15 VITALS — BP 136/89 | HR 59 | Temp 97.7°F | Ht 71.0 in | Wt 232.8 lb

## 2024-04-15 DIAGNOSIS — Z125 Encounter for screening for malignant neoplasm of prostate: Secondary | ICD-10-CM

## 2024-04-15 DIAGNOSIS — I2510 Atherosclerotic heart disease of native coronary artery without angina pectoris: Secondary | ICD-10-CM | POA: Diagnosis not present

## 2024-04-15 DIAGNOSIS — Z23 Encounter for immunization: Secondary | ICD-10-CM

## 2024-04-15 DIAGNOSIS — K219 Gastro-esophageal reflux disease without esophagitis: Secondary | ICD-10-CM

## 2024-04-15 DIAGNOSIS — Z87891 Personal history of nicotine dependence: Secondary | ICD-10-CM

## 2024-04-15 DIAGNOSIS — I10 Essential (primary) hypertension: Secondary | ICD-10-CM | POA: Diagnosis not present

## 2024-04-15 DIAGNOSIS — E785 Hyperlipidemia, unspecified: Secondary | ICD-10-CM | POA: Diagnosis not present

## 2024-04-15 NOTE — Progress Notes (Addendum)
 Subjective:     Patient ID: Dylan Hudson, male    DOB: 1961-04-27, 63 y.o.   MRN: [id number]  No chief complaint on file.   HPI  Discussed the use of AI scribe software for clinical note transcription with the patient, who gave verbal consent to proceed.  History of Present Illness Dylan Hudson is a 63 year old male who presents for follow-up.  He works full-time and lives in [place]. He reports good blood pressure control at home  He has been using Repatha  injections twice a month and rosuvastatin  5 mg without side effects. He previously had muscle cramps on other statins that limited his walking. He has missed Repatha  injections for the last two months.  He had CABG 18 years ago and follows with cardiology. He takes lisinopril  and Protonix  40 mg daily for heartburn. Prior attempts to lower the Protonix  dose caused recurrent heartburn within 122 days. He has used Protonix  long term and is interested in trying a lower dose again.  He has hemochromatosis, follows with hematology.   He quit smoking 19 years ago after CABG, following a 40-year smoking history. Prior low-dose CT lung screening in 2020 showed low-risk nodules.  Patient denies fever, chills, SOB, CP, palpitations, dyspnea, edema, HA, vision changes, N/V/D, abdominal pain, urinary symptoms, rash, weight changes, and recent illness or hospitalizations.   History of Present Illness              Health Maintenance Due  Topic Date Due   Medicare Annual Wellness (AWV)  Never done   COVID-19 Vaccine (1) Never done   HIV Screening  Never done   Zoster Vaccines- Shingrix (1 of 2) Never done    Past Medical History:  Diagnosis Date   Allergy    seasonal   Amenia    postoperative   Anemia    Benign positional vertigo    CAD (coronary artery disease)    s/p CABG 01/07   Diverticula of colon    Esophageal stricture 08/03/2013   GERD (gastroesophageal reflux disease)    Gilbert's syndrome     possible. (elevated bilirubin)   Hemochromatosis    Hx of adenomatous colonic polyps    Medoff   Hyperlipidemia    Hypertension    Internal hemorrhoid    Memory loss    Myocardial infarction (HCC) 05/2005   Tobacco abuse    Vertigo     Past Surgical History:  Procedure Laterality Date   APPENDECTOMY  2014   lap appy   COLONOSCOPY  2003, 2007, 02/2011   small adenomas   CORONARY ARTERY BYPASS GRAFT     ESOPHAGEAL DILATION  11/19/13   LAPAROSCOPIC APPENDECTOMY N/A 08/06/2012   Procedure: APPENDECTOMY LAPAROSCOPIC;  Surgeon: Donnice POUR. Tsuei, MD;  Location: [facility] OR;  Service: General;  Laterality: N/A;   left knee arthroscopy     MEDIAN STERNOTOMY     for CABG x 4 (left internal mammary artery to distal left anterior descending coronary artery, right internal mammary artery to first circumflex marginal branch, saphenous vein graft to posterior descending coronary artery, saphenous vein graft to diagonal branch, endoscopic saphenous vein harvest from right thigh.) SURGEON: Sudie DEL. Dusty, M.D. CHO/MEDQ D:06/08/05. T: 06/09/2005 Job: [id number]   THERAPEUTIC PHLEBOTOMY  03/21/2015        Family History  Problem Relation Age of Onset   Colon polyps Mother    Colon cancer Paternal Uncle    Heart attack Maternal Grandfather  Breast cancer Neg Hx    Prostate cancer Neg Hx    Esophageal cancer Neg Hx    Stomach cancer Neg Hx    Rectal cancer Neg Hx     Social History   Socioeconomic History   Marital status: Divorced    Spouse name: Not on file   Number of children: 1   Years of education: 12   Highest education level: Not on file  Occupational History    Comment: software engineer   Occupation: SERVICE MANAGER    Employer: [employer]  Tobacco Use   Smoking status: Former    Current packs/day: 0.00    Average packs/day: 2.0 packs/day for 36.0 years (72.0 ttl pk-yrs)    Types: Cigarettes    Start date: 03/07/1970    Quit date: 03/07/2006    Years since  quitting: 18.1   Smokeless tobacco: Never   Tobacco comments:    smoked since age 22 approx. 1 pact to 1 and a half packs a day. fortunately after his bypass surgery he completely discontinued smoking  Vaping Use   Vaping status: Never Used  Substance and Sexual Activity   Alcohol use: Yes    Alcohol/week: 4.0 standard drinks of alcohol    Types: 4 Cans of beer per week    Comment: socially   Drug use: No   Sexual activity: Not on file  Other Topics Concern   Not on file  Social History Narrative   Lives with girlfriend. Fishing is a hobby.    Fun: Go to the lake every weekend.    Social Drivers of Health   Financial Resource Strain: Patient Declined (04/15/2024)   Overall Financial Resource Strain (CARDIA)    Difficulty of Paying Living Expenses: Patient declined  Food Insecurity: Patient Declined (04/15/2024)   Hunger Vital Sign    Worried About Running Out of Food in the Last Year: Patient declined    Ran Out of Food in the Last Year: Patient declined  Transportation Needs: Not on file  Physical Activity: Not on file  Stress: No Stress Concern Present (04/15/2024)   [employer] of Occupational Health - Occupational Stress Questionnaire    Feeling of Stress: Not at all  Social Connections: Unknown (04/15/2024)   Social Connection and Isolation Panel    Frequency of Communication with Friends and Family: Patient declined    Frequency of Social Gatherings with Friends and Family: Patient declined    Attends Religious Services: Patient declined    Database Administrator or Organizations: Patient declined    Attends Engineer, Structural: Not on file    Marital Status: Divorced  Intimate Partner Violence: Not on file    Outpatient Medications Prior to Visit  Medication Sig Dispense Refill   aspirin  81 MG tablet Take 1 tablet (81 mg total) by mouth daily.     Evolocumab  (REPATHA  SURECLICK) 140 MG/ML SOAJ INJECT ONE MILLILITER INTO THE SKIN EVERY 14 DAYS 6 mL  0   fish oil-omega-3 fatty acids 1000 MG capsule Take 2 g by mouth 2 (two) times daily.     fluticasone  (FLONASE ) 50 MCG/ACT nasal spray USE 2 SPRAYS INTO THE NOSE DAILY 16 g 1   lisinopril  (ZESTRIL ) 20 MG tablet TAKE 1 TABLET BY MOUTH DAILY 90 tablet 3   loratadine (CLARITIN) 10 MG tablet Take 10 mg by mouth daily as needed. For allergies     metoprolol  succinate (TOPROL -XL) 25 MG 24 hr tablet Take 1 tablet (25 mg  total) by mouth daily. 90 tablet 3   pantoprazole  (PROTONIX ) 40 MG tablet TAKE 1 TABLET BY MOUTH DAILY WITH BREAKFAST 90 tablet 3   rosuvastatin  (CRESTOR ) 5 MG tablet TAKE ONE TABLET BY MOUTH ONCE TO 3 TIMES A WEEK AS TOLERATED *NEED APPOINTMENT FOR REFILLS 48 tablet 3   No facility-administered medications prior to visit.    Allergies  Allergen Reactions   Niacin And Related Other (See Comments)    Chest pain   Pravachol  [Pravastatin ] Other (See Comments)    Myalgia    Red Yeast Rice [Cholestin] Other (See Comments)    Jitters    Zetia  [Ezetimibe ] Other (See Comments)    Joint pain   Zocor [Simvastatin] Other (See Comments)    REACTION: Myalgias    ROS     Objective:    Physical Exam Vitals reviewed.  Constitutional:      General: He is not in acute distress.    Appearance: He is obese. He is not toxic-appearing.  HENT:     Head: Normocephalic and atraumatic.     Mouth/Throat:     Mouth: Mucous membranes are moist.     Pharynx: Oropharynx is clear.  Eyes:     Extraocular Movements: Extraocular movements intact.     Pupils: Pupils are equal, round, and reactive to light.  Cardiovascular:     Rate and Rhythm: Normal rate and regular rhythm.     Pulses: Normal pulses.     Heart sounds: Normal heart sounds. No murmur heard. Pulmonary:     Effort: Pulmonary effort is normal. No respiratory distress.     Breath sounds: Normal breath sounds. No wheezing.  Musculoskeletal:        General: No swelling.     Cervical back: Neck supple.  Skin:    General: Skin  is warm and dry.  Neurological:     General: No focal deficit present.     Mental Status: He is alert and oriented to person, place, and time.  Psychiatric:        Mood and Affect: Mood normal.        Behavior: Behavior normal.        Thought Content: Thought content normal.        Judgment: Judgment normal.      BP 136/89   Pulse (!) 59   Temp 97.7 F (36.5 C)   Ht 5' 11 (1.803 m)   Wt 232 lb 12.8 oz (105.6 kg)   SpO2 96%   BMI 32.47 kg/m  Wt Readings from Last 3 Encounters:  04/15/24 232 lb 12.8 oz (105.6 kg)  02/20/24 227 lb 11.2 oz (103.3 kg)  09/25/23 228 lb (103.4 kg)       Assessment & Plan:   Problem List Items Addressed This Visit     Coronary atherosclerosis   S/p CABG.denies chest pain.Follows with Cardiology. On ASA and Statin.      Relevant Orders   Hepatic function panel   Essential hypertension   Well controlled, no changes to meds. Encouraged heart healthy diet such as the DASH diet and exercise as tolerated.        Relevant Orders   CBC with Differential/Platelet   TSH   Gastroesophageal reflux disease   Pt reports long-term use of Protonix  40 mg daily.  Attempting to reduce dose to 20 mg to assess symptom control. - Reduce Protonix  to 20 mg daily. - Monitor symptoms, if symptoms return OK to increase back to original dose.  Hereditary hemochromatosis   Follows with Hematology. Stable, asymptotic.       Hyperlipidemia   Managed with low dose Crestor  and Repatha . Following with cardiology. LDL goal <50. Encourage heart healthy diet such as MIND or DASH diet, increase exercise, avoid trans fats, simple carbohydrates and processed foods, consider a krill or fish or flaxseed oil cap daily.        Relevant Orders   Comprehensive metabolic panel with GFR   Lipid panel   Other Visit Diagnoses       Need for influenza vaccination    -  Primary   Relevant Orders   Flu vaccine HIGH DOSE PF(Fluzone Trivalent) (Completed)      Screening for malignant neoplasm of prostate       Relevant Orders   PSA     Need for pneumococcal 20-valent conjugate vaccination       Relevant Orders   Pneumococcal conjugate vaccine 20-valent (Prevnar 20) (Completed)      Lung nodules, stable, under surveillance Lung nodules identified on previous imaging, low likelihood of malignancy. Last low-dose CT scan in 2020 and radiology recommend FU imaging.  - Ordered annual low-dose CT scan for lung nodule surveillance.  I am having Amahri Dengel. Claudene Diones maintain his loratadine, fish oil-omega-3 fatty acids, fluticasone , aspirin , metoprolol  succinate, lisinopril , pantoprazole , rosuvastatin , and Repatha  [employer].  No orders of the defined types were placed in this encounter.

## 2024-04-15 NOTE — Assessment & Plan Note (Signed)
 S/p CABG.denies chest pain.Follows with Cardiology. On ASA and Statin.

## 2024-04-15 NOTE — Assessment & Plan Note (Signed)
 Well controlled, no changes to meds. Encouraged heart healthy diet such as the DASH diet and exercise as tolerated.

## 2024-04-15 NOTE — Assessment & Plan Note (Signed)
 Follows with Hematology. Stable, asymptotic.

## 2024-04-15 NOTE — Assessment & Plan Note (Signed)
 Managed with low dose Crestor  and Repatha . Following with cardiology. LDL goal <50. Encourage heart healthy diet such as MIND or DASH diet, increase exercise, avoid trans fats, simple carbohydrates and processed foods, consider a krill or fish or flaxseed oil cap daily.

## 2024-04-15 NOTE — Assessment & Plan Note (Addendum)
 Pt reports long-term use of Protonix  40 mg daily.  Attempting to reduce dose to 20 mg to assess symptom control. - Reduce Protonix  to 20 mg daily. - Monitor symptoms, if symptoms return OK to increase back to original dose.

## 2024-04-15 NOTE — Assessment & Plan Note (Signed)
 Refer LDCT screening.

## 2024-04-22 NOTE — Addendum Note (Signed)
 Addended by: WHEELER RAISIN L on: 04/22/2024 01:00 PM   Modules accepted: Orders

## 2024-04-25 LAB — LIPID PANEL
Chol/HDL Ratio: 3.2 ratio (ref 0.0–5.0)
Cholesterol, Total: 130 mg/dL (ref 100–199)
HDL: 41 mg/dL (ref >39)
LDL Chol Calc (NIH): 62 mg/dL (ref 0–99)
Triglycerides: 157 mg/dL — ABNORMAL HIGH (ref 0–149)
VLDL Cholesterol Cal: 27 mg/dL (ref 5–40)

## 2024-04-25 LAB — HEPATIC FUNCTION PANEL
ALT: 50 IU/L — ABNORMAL HIGH (ref 0–44)
AST: 29 IU/L (ref 0–40)
Albumin: 4.6 g/dL (ref 3.9–4.9)
Alkaline Phosphatase: 56 IU/L (ref 47–123)
Bilirubin Total: 2.3 mg/dL — ABNORMAL HIGH (ref 0.0–1.2)
Bilirubin, Direct: 0.91 mg/dL — AB (ref 0.00–0.40)
Total Protein: 7 g/dL (ref 6.0–8.5)

## 2024-05-29 ENCOUNTER — Other Ambulatory Visit: Payer: Self-pay | Admitting: Cardiology

## 2024-05-29 DIAGNOSIS — E785 Hyperlipidemia, unspecified: Secondary | ICD-10-CM

## 2024-06-15 ENCOUNTER — Telehealth: Payer: Self-pay | Admitting: Pharmacy Technician

## 2024-06-15 ENCOUNTER — Other Ambulatory Visit (HOSPITAL_COMMUNITY): Payer: Self-pay

## 2024-06-15 NOTE — Telephone Encounter (Signed)
 Pharmacy Patient Advocate Encounter  Received notification from [employer] that Prior Authorization for repatha  has been APPROVED from 06/15/24 to 05/20/25   PA #/Case ID/Reference #: [id number]

## 2024-06-15 NOTE — Telephone Encounter (Signed)
" ° °  Pharmacy Patient Advocate Encounter   Received notification from CoverMyMeds that prior authorization for REPATHA  is required/requested.   Insurance verification completed.   The patient is insured through [place].   Per test claim: PA required; PA submitted to above mentioned insurance via Latent Key/confirmation #/EOC [id number] Status is pending  "

## 2024-06-18 ENCOUNTER — Other Ambulatory Visit: Payer: Self-pay | Admitting: Cardiology

## 2024-06-18 DIAGNOSIS — I2510 Atherosclerotic heart disease of native coronary artery without angina pectoris: Secondary | ICD-10-CM
